# Patient Record
Sex: Male | Born: 1940 | Race: White | Hispanic: No | Marital: Married | Smoking: Former smoker
Health system: Southern US, Community
[De-identification: ages and names within clinical notes are randomized; demographics above are authoritative.]

## PROBLEM LIST (undated history)

## (undated) DIAGNOSIS — Z972 Presence of dental prosthetic device (complete) (partial): Secondary | ICD-10-CM

## (undated) DIAGNOSIS — K08109 Complete loss of teeth, unspecified cause, unspecified class: Secondary | ICD-10-CM

## (undated) DIAGNOSIS — R319 Hematuria, unspecified: Secondary | ICD-10-CM

## (undated) DIAGNOSIS — E119 Type 2 diabetes mellitus without complications: Secondary | ICD-10-CM

## (undated) DIAGNOSIS — E785 Hyperlipidemia, unspecified: Secondary | ICD-10-CM

## (undated) DIAGNOSIS — K219 Gastro-esophageal reflux disease without esophagitis: Secondary | ICD-10-CM

## (undated) DIAGNOSIS — Z87442 Personal history of urinary calculi: Secondary | ICD-10-CM

## (undated) DIAGNOSIS — Z951 Presence of aortocoronary bypass graft: Secondary | ICD-10-CM

## (undated) DIAGNOSIS — N401 Enlarged prostate with lower urinary tract symptoms: Secondary | ICD-10-CM

## (undated) DIAGNOSIS — Z8679 Personal history of other diseases of the circulatory system: Secondary | ICD-10-CM

## (undated) DIAGNOSIS — N200 Calculus of kidney: Secondary | ICD-10-CM

## (undated) DIAGNOSIS — I2510 Atherosclerotic heart disease of native coronary artery without angina pectoris: Secondary | ICD-10-CM

## (undated) DIAGNOSIS — N210 Calculus in bladder: Secondary | ICD-10-CM

## (undated) DIAGNOSIS — I10 Essential (primary) hypertension: Secondary | ICD-10-CM

## (undated) HISTORY — PX: ROTATOR CUFF REPAIR: SHX139

## (undated) HISTORY — PX: CATARACT EXTRACTION W/ INTRAOCULAR LENS  IMPLANT, BILATERAL: SHX1307

---

## 1994-05-18 HISTORY — PX: ROTATOR CUFF REPAIR: SHX139

## 2005-04-05 ENCOUNTER — Ambulatory Visit (HOSPITAL_COMMUNITY): Admission: RE | Admit: 2005-04-05 | Discharge: 2005-04-05 | Payer: Self-pay | Admitting: Gastroenterology

## 2005-04-05 ENCOUNTER — Encounter (INDEPENDENT_AMBULATORY_CARE_PROVIDER_SITE_OTHER): Payer: Self-pay | Admitting: *Deleted

## 2010-04-08 ENCOUNTER — Ambulatory Visit (HOSPITAL_COMMUNITY): Admission: RE | Admit: 2010-04-08 | Discharge: 2010-04-08 | Payer: Self-pay | Admitting: Gastroenterology

## 2010-12-03 NOTE — Op Note (Signed)
NAME:  [name], HOLST NO.:  [id number]   MEDICAL RECORD NO.:  [id number]          PATIENT TYPE:  AMB   LOCATION:  ENDO                         FACILITY:  [facility]   PHYSICIAN:  Danise Edge, M.D.   DATE OF BIRTH:  10/03/1940   DATE OF PROCEDURE:  04/05/2005  DATE OF DISCHARGE:                                 OPERATIVE REPORT   PROCEDURE:  Screening colonoscopy.   INDICATIONS:  Mr. Alberta Cairns is a 70 year old male born November 02, 1940. Mr. Yoshino is scheduled to undergo his first screening colonoscopy  with polypectomy to prevent colon cancer. I discussed with him the  complications associated with colonoscopy and polypectomy including a 15 per  1000 risk of bleeding and 1 per 1000 risk of colon perforation requiring  surgical repair. Mr. Trinka has signed the operative permit.   ENDOSCOPIST:  Danise Edge, M.D.   PREMEDICATION:  Versed to 7.5 milligrams, Demerol 50 milligrams.   DESCRIPTION OF PROCEDURE:  After obtaining informed consent, Mr. Hauk  was placed in the left lateral decubitus position. I administered  intravenous Demerol and intravenous Versed to achieve conscious sedation for  the procedure. The patient's blood pressure, oxygen saturation and cardiac  rhythm were monitored throughout the procedure and documented in the medical  record.   Anal inspection was normal. Digital rectal exam reveals a nonnodular  prostate. The Olympus adjustable pediatric colonoscope was introduced into  the rectum and advanced to the cecum. A normal-appearing ileocecal valve and  appendiceal orifice were identified. Colonic preparation exam today was  satisfactory.   RECTUM:  Normal.  SIGMOID COLON AND DESCENDING COLON:  Normal.  SPLENIC FLEXURE:  Normal.  TRANSVERSE COLON:  Normal.  HEPATIC FLEXURE:  Normal.  ASCENDING COLON:  Normal.  CECUM AND ILEOCECAL VALVE:  From the proximal cecum in the area of the  appendiceal orifice, a 1-mm  sessile polyp was removed with the cold biopsy  forceps.   ASSESSMENT:  A diminutive polyp was removed from the cecum; otherwise normal  screening proctocolonoscopy to the cecum.           ______________________________  Danise Edge, M.D.     MJ/MEDQ  D:  04/05/2005  T:  04/05/2005  Job:  [id number]   cc:   Erskine Speed, M.D.  Fax: [phone]

## 2013-10-22 ENCOUNTER — Other Ambulatory Visit: Payer: Self-pay | Admitting: Internal Medicine

## 2013-10-22 ENCOUNTER — Ambulatory Visit
Admission: RE | Admit: 2013-10-22 | Discharge: 2013-10-22 | Disposition: A | Discharge status: Home / Self Care | Payer: Medicare Other | Source: Ambulatory Visit | Attending: Internal Medicine | Admitting: Internal Medicine

## 2013-10-22 DIAGNOSIS — M25532 Pain in left wrist: Secondary | ICD-10-CM

## 2013-10-22 DIAGNOSIS — R609 Edema, unspecified: Secondary | ICD-10-CM

## 2014-07-28 DIAGNOSIS — M25561 Pain in right knee: Secondary | ICD-10-CM | POA: Diagnosis not present

## 2014-07-28 DIAGNOSIS — I10 Essential (primary) hypertension: Secondary | ICD-10-CM | POA: Diagnosis not present

## 2014-07-28 DIAGNOSIS — E1165 Type 2 diabetes mellitus with hyperglycemia: Secondary | ICD-10-CM | POA: Diagnosis not present

## 2014-09-02 DIAGNOSIS — I10 Essential (primary) hypertension: Secondary | ICD-10-CM | POA: Diagnosis not present

## 2014-09-02 DIAGNOSIS — E1165 Type 2 diabetes mellitus with hyperglycemia: Secondary | ICD-10-CM | POA: Diagnosis not present

## 2014-09-02 DIAGNOSIS — M25561 Pain in right knee: Secondary | ICD-10-CM | POA: Diagnosis not present

## 2014-10-01 DIAGNOSIS — H35369 Drusen (degenerative) of macula, unspecified eye: Secondary | ICD-10-CM | POA: Diagnosis not present

## 2014-11-30 ENCOUNTER — Emergency Department (HOSPITAL_COMMUNITY): Payer: Medicare Other

## 2014-11-30 ENCOUNTER — Encounter (HOSPITAL_COMMUNITY): Payer: Self-pay | Admitting: *Deleted

## 2014-11-30 ENCOUNTER — Inpatient Hospital Stay (HOSPITAL_COMMUNITY): Payer: Medicare Other

## 2014-11-30 ENCOUNTER — Inpatient Hospital Stay (HOSPITAL_COMMUNITY)
Admission: EM | Admit: 2014-11-30 | Discharge: 2014-12-14 | DRG: 234 | Disposition: A | Discharge status: Home / Self Care | Payer: Medicare Other | Attending: Thoracic Surgery (Cardiothoracic Vascular Surgery) | Admitting: Thoracic Surgery (Cardiothoracic Vascular Surgery)

## 2014-11-30 DIAGNOSIS — T8383XA Hemorrhage of genitourinary prosthetic devices, implants and grafts, initial encounter: Secondary | ICD-10-CM | POA: Diagnosis not present

## 2014-11-30 DIAGNOSIS — E785 Hyperlipidemia, unspecified: Secondary | ICD-10-CM | POA: Diagnosis not present

## 2014-11-30 DIAGNOSIS — J984 Other disorders of lung: Secondary | ICD-10-CM | POA: Diagnosis not present

## 2014-11-30 DIAGNOSIS — D62 Acute posthemorrhagic anemia: Secondary | ICD-10-CM | POA: Diagnosis not present

## 2014-11-30 DIAGNOSIS — I214 Non-ST elevation (NSTEMI) myocardial infarction: Secondary | ICD-10-CM | POA: Diagnosis present

## 2014-11-30 DIAGNOSIS — I481 Persistent atrial fibrillation: Secondary | ICD-10-CM | POA: Diagnosis not present

## 2014-11-30 DIAGNOSIS — I25110 Atherosclerotic heart disease of native coronary artery with unstable angina pectoris: Secondary | ICD-10-CM | POA: Diagnosis not present

## 2014-11-30 DIAGNOSIS — E669 Obesity, unspecified: Secondary | ICD-10-CM | POA: Diagnosis present

## 2014-11-30 DIAGNOSIS — Z87891 Personal history of nicotine dependence: Secondary | ICD-10-CM | POA: Diagnosis not present

## 2014-11-30 DIAGNOSIS — R001 Bradycardia, unspecified: Secondary | ICD-10-CM | POA: Diagnosis present

## 2014-11-30 DIAGNOSIS — I2510 Atherosclerotic heart disease of native coronary artery without angina pectoris: Secondary | ICD-10-CM | POA: Diagnosis not present

## 2014-11-30 DIAGNOSIS — Z951 Presence of aortocoronary bypass graft: Secondary | ICD-10-CM

## 2014-11-30 DIAGNOSIS — Z8249 Family history of ischemic heart disease and other diseases of the circulatory system: Secondary | ICD-10-CM

## 2014-11-30 DIAGNOSIS — I517 Cardiomegaly: Secondary | ICD-10-CM | POA: Diagnosis not present

## 2014-11-30 DIAGNOSIS — E119 Type 2 diabetes mellitus without complications: Secondary | ICD-10-CM | POA: Diagnosis not present

## 2014-11-30 DIAGNOSIS — I471 Supraventricular tachycardia: Secondary | ICD-10-CM | POA: Diagnosis not present

## 2014-11-30 DIAGNOSIS — I2119 ST elevation (STEMI) myocardial infarction involving other coronary artery of inferior wall: Secondary | ICD-10-CM | POA: Diagnosis not present

## 2014-11-30 DIAGNOSIS — J9811 Atelectasis: Secondary | ICD-10-CM

## 2014-11-30 DIAGNOSIS — I252 Old myocardial infarction: Secondary | ICD-10-CM

## 2014-11-30 DIAGNOSIS — I4891 Unspecified atrial fibrillation: Secondary | ICD-10-CM | POA: Diagnosis not present

## 2014-11-30 DIAGNOSIS — I10 Essential (primary) hypertension: Secondary | ICD-10-CM | POA: Diagnosis present

## 2014-11-30 DIAGNOSIS — R079 Chest pain, unspecified: Secondary | ICD-10-CM

## 2014-11-30 DIAGNOSIS — Z6833 Body mass index (BMI) 33.0-33.9, adult: Secondary | ICD-10-CM

## 2014-11-30 DIAGNOSIS — Y846 Urinary catheterization as the cause of abnormal reaction of the patient, or of later complication, without mention of misadventure at the time of the procedure: Secondary | ICD-10-CM | POA: Diagnosis not present

## 2014-11-30 DIAGNOSIS — N200 Calculus of kidney: Secondary | ICD-10-CM | POA: Insufficient documentation

## 2014-11-30 HISTORY — DX: Type 2 diabetes mellitus without complications: E11.9

## 2014-11-30 HISTORY — DX: Essential (primary) hypertension: I10

## 2014-11-30 HISTORY — DX: Old myocardial infarction: I25.2

## 2014-11-30 HISTORY — DX: Hyperlipidemia, unspecified: E78.5

## 2014-11-30 HISTORY — DX: Calculus of kidney: N20.0

## 2014-11-30 HISTORY — DX: Presence of aortocoronary bypass graft: Z95.1

## 2014-11-30 LAB — CBC WITH DIFFERENTIAL/PLATELET
BASOS ABS: 0 10*3/uL (ref 0.0–0.1)
Basophils Absolute: 0 10*3/uL (ref 0.0–0.1)
Basophils Relative: 0 % (ref 0–1)
Basophils Relative: 0 % (ref 0–1)
EOS ABS: 0.2 10*3/uL (ref 0.0–0.7)
EOS ABS: 0.1 10*3/uL (ref 0.0–0.7)
EOS PCT: 2 % (ref 0–5)
Eosinophils Relative: 2 % (ref 0–5)
HEMATOCRIT: 39.8 % (ref 39.0–52.0)
HEMATOCRIT: 38.5 % — AB (ref 39.0–52.0)
HEMOGLOBIN: 14.1 g/dL (ref 13.0–17.0)
Hemoglobin: 13.1 g/dL (ref 13.0–17.0)
LYMPHS PCT: 46 % (ref 12–46)
Lymphocytes Relative: 41 % (ref 12–46)
Lymphs Abs: 3.4 10*3/uL (ref 0.7–4.0)
Lymphs Abs: 2.8 10*3/uL (ref 0.7–4.0)
MCH: 31.5 pg (ref 26.0–34.0)
MCH: 30.2 pg (ref 26.0–34.0)
MCHC: 35.4 g/dL (ref 30.0–36.0)
MCHC: 34 g/dL (ref 30.0–36.0)
MCV: 88.8 fL (ref 78.0–100.0)
MCV: 88.7 fL (ref 78.0–100.0)
MONO ABS: 0.6 10*3/uL (ref 0.1–1.0)
MONOS PCT: 8 % (ref 3–12)
MONOS PCT: 7 % (ref 3–12)
Monocytes Absolute: 0.5 10*3/uL (ref 0.1–1.0)
NEUTROS ABS: 3.4 10*3/uL (ref 1.7–7.7)
NEUTROS PCT: 44 % (ref 43–77)
Neutro Abs: 3.3 10*3/uL (ref 1.7–7.7)
Neutrophils Relative %: 50 % (ref 43–77)
PLATELETS: 150 10*3/uL (ref 150–400)
Platelets: 149 10*3/uL — ABNORMAL LOW (ref 150–400)
RBC: 4.48 MIL/uL (ref 4.22–5.81)
RBC: 4.34 MIL/uL (ref 4.22–5.81)
RDW: 12.8 % (ref 11.5–15.5)
RDW: 12.9 % (ref 11.5–15.5)
WBC: 7.5 10*3/uL (ref 4.0–10.5)
WBC: 6.9 10*3/uL (ref 4.0–10.5)

## 2014-11-30 LAB — COMPREHENSIVE METABOLIC PANEL
ALBUMIN: 3.4 g/dL — AB (ref 3.5–5.0)
ALT: 31 U/L (ref 17–63)
ANION GAP: 8 (ref 5–15)
AST: 30 U/L (ref 15–41)
Alkaline Phosphatase: 67 U/L (ref 38–126)
BILIRUBIN TOTAL: 0.9 mg/dL (ref 0.3–1.2)
BUN: 18 mg/dL (ref 6–20)
CO2: 26 mmol/L (ref 22–32)
Calcium: 9.3 mg/dL (ref 8.9–10.3)
Chloride: 105 mmol/L (ref 101–111)
Creatinine, Ser: 0.94 mg/dL (ref 0.61–1.24)
GFR calc non Af Amer: >60 mL/min (ref >60)
Glucose, Bld: 182 mg/dL — ABNORMAL HIGH (ref 65–99)
Potassium: 3.9 mmol/L (ref 3.5–5.1)
Sodium: 139 mmol/L (ref 135–145)
Total Protein: 6.4 g/dL — ABNORMAL LOW (ref 6.5–8.1)

## 2014-11-30 LAB — PROTIME-INR
INR: 1.13 (ref 0.00–1.49)
PROTHROMBIN TIME: 14.6 s (ref 11.6–15.2)

## 2014-11-30 LAB — GLUCOSE, CAPILLARY
GLUCOSE-CAPILLARY: 147 mg/dL — AB (ref 65–99)
GLUCOSE-CAPILLARY: 155 mg/dL — AB (ref 65–99)
Glucose-Capillary: 188 mg/dL — ABNORMAL HIGH (ref 65–99)
Glucose-Capillary: 162 mg/dL — ABNORMAL HIGH (ref 65–99)

## 2014-11-30 LAB — LIPID PANEL
CHOL/HDL RATIO: 3.7 ratio
Cholesterol: 156 mg/dL (ref 0–200)
HDL: 42 mg/dL (ref >40)
LDL CALC: 98 mg/dL (ref 0–99)
TRIGLYCERIDES: 78 mg/dL (ref <150)
VLDL: 16 mg/dL (ref 0–40)

## 2014-11-30 LAB — D-DIMER, QUANTITATIVE: D-Dimer, Quant: 0.34 ug/mL-FEU (ref 0.00–0.48)

## 2014-11-30 LAB — TROPONIN I
TROPONIN I: 0.05 ng/mL — AB (ref <0.031)
Troponin I: 0.21 ng/mL — ABNORMAL HIGH (ref <0.031)
Troponin I: 1.2 ng/mL (ref <0.031)
Troponin I: 1.66 ng/mL (ref <0.031)
Troponin I: 1.46 ng/mL (ref <0.031)
Troponin I: 1.35 ng/mL (ref <0.031)
Troponin I: 1.11 ng/mL (ref <0.031)
Troponin I: 0.9 ng/mL (ref <0.031)

## 2014-11-30 LAB — BASIC METABOLIC PANEL
Anion gap: 13 (ref 5–15)
BUN: 22 mg/dL — ABNORMAL HIGH (ref 6–20)
CHLORIDE: 102 mmol/L (ref 101–111)
CO2: 23 mmol/L (ref 22–32)
Calcium: 9.3 mg/dL (ref 8.9–10.3)
Creatinine, Ser: 0.98 mg/dL (ref 0.61–1.24)
Glucose, Bld: 191 mg/dL — ABNORMAL HIGH (ref 65–99)
POTASSIUM: 3.7 mmol/L (ref 3.5–5.1)
Sodium: 138 mmol/L (ref 135–145)

## 2014-11-30 LAB — MAGNESIUM: Magnesium: 1.7 mg/dL (ref 1.7–2.4)

## 2014-11-30 LAB — HEPARIN LEVEL (UNFRACTIONATED)
HEPARIN UNFRACTIONATED: 0.31 [IU]/mL (ref 0.30–0.70)
Heparin Unfractionated: 0.29 IU/mL — ABNORMAL LOW (ref 0.30–0.70)

## 2014-11-30 LAB — APTT: APTT: 90 s — AB (ref 24–37)

## 2014-11-30 LAB — TSH: TSH: 1.816 u[IU]/mL (ref 0.350–4.500)

## 2014-11-30 MED ORDER — PERFLUTREN LIPID MICROSPHERE
1.0000 mL | INTRAVENOUS | Status: AC | PRN
  Administered 2014-11-30: 2 mL via INTRAVENOUS
  Filled 2014-11-30: qty 10

## 2014-11-30 MED ORDER — NITROGLYCERIN 0.4 MG SL SUBL: 0.4000 mg | SUBLINGUAL_TABLET | SUBLINGUAL | Status: DC | PRN

## 2014-11-30 MED ORDER — INSULIN ASPART 100 UNIT/ML ~~LOC~~ SOLN
0.0000 [IU] | Freq: Three times a day (TID) | SUBCUTANEOUS | Status: DC
  Administered 2014-11-30: 3 [IU] via SUBCUTANEOUS
  Administered 2014-12-01: 2 [IU] via SUBCUTANEOUS
  Administered 2014-12-01: 3 [IU] via SUBCUTANEOUS
  Administered 2014-12-01: 2 [IU] via SUBCUTANEOUS
  Administered 2014-12-02: 3 [IU] via SUBCUTANEOUS
  Administered 2014-12-02 – 2014-12-03 (×2): 2 [IU] via SUBCUTANEOUS
  Administered 2014-12-03 (×2): 3 [IU] via SUBCUTANEOUS
  Administered 2014-12-04: 5 [IU] via SUBCUTANEOUS
  Administered 2014-12-04 (×2): 3 [IU] via SUBCUTANEOUS

## 2014-11-30 MED ORDER — ASPIRIN 300 MG RE SUPP: 300.0000 mg | RECTAL | Status: DC

## 2014-11-30 MED ORDER — ATORVASTATIN CALCIUM 40 MG PO TABS
40.0000 mg | ORAL_TABLET | Freq: Every day | ORAL | Status: DC
  Administered 2014-11-30 – 2014-12-08 (×8): 40 mg via ORAL
  Filled 2014-11-30 (×10): qty 1

## 2014-11-30 MED ORDER — HEPARIN BOLUS VIA INFUSION
4000.0000 [IU] | Freq: Once | INTRAVENOUS | Status: AC
  Administered 2014-11-30: 4000 [IU] via INTRAVENOUS
  Filled 2014-11-30: qty 4000

## 2014-11-30 MED ORDER — SODIUM CHLORIDE 0.9 % IJ SOLN
3.0000 mL | Freq: Two times a day (BID) | INTRAMUSCULAR | Status: DC
  Administered 2014-12-01: 3 mL via INTRAVENOUS

## 2014-11-30 MED ORDER — ACETAMINOPHEN 325 MG PO TABS
650.0000 mg | ORAL_TABLET | ORAL | Status: DC | PRN
  Administered 2014-12-01 – 2014-12-04 (×4): 650 mg via ORAL
  Filled 2014-11-30 (×4): qty 2

## 2014-11-30 MED ORDER — HEPARIN (PORCINE) IN NACL 100-0.45 UNIT/ML-% IJ SOLN
1600.0000 [IU]/h | INTRAMUSCULAR | Status: DC
  Administered 2014-11-30 (×2): 1500 [IU]/h via INTRAVENOUS
  Administered 2014-12-01: 1600 [IU]/h via INTRAVENOUS
  Filled 2014-11-30 (×2): qty 250

## 2014-11-30 MED ORDER — LISINOPRIL 5 MG PO TABS
5.0000 mg | ORAL_TABLET | Freq: Every day | ORAL | Status: DC
  Administered 2014-11-30 – 2014-12-04 (×5): 5 mg via ORAL
  Filled 2014-11-30 (×6): qty 1

## 2014-11-30 MED ORDER — SODIUM CHLORIDE 0.9 % IV SOLN: 250.0000 mL | INTRAVENOUS | Status: DC | PRN

## 2014-11-30 MED ORDER — ONDANSETRON HCL 4 MG/2ML IJ SOLN: 4.0000 mg | Freq: Four times a day (QID) | INTRAMUSCULAR | Status: DC | PRN

## 2014-11-30 MED ORDER — HEPARIN (PORCINE) IN NACL 100-0.45 UNIT/ML-% IJ SOLN
1350.0000 [IU]/h | INTRAMUSCULAR | Status: DC
  Administered 2014-11-30: 1350 [IU]/h via INTRAVENOUS
  Filled 2014-11-30: qty 250

## 2014-11-30 MED ORDER — ASPIRIN EC 81 MG PO TBEC
81.0000 mg | DELAYED_RELEASE_TABLET | Freq: Every day | ORAL | Status: DC
  Administered 2014-12-01 – 2014-12-04 (×4): 81 mg via ORAL
  Filled 2014-11-30 (×5): qty 1

## 2014-11-30 MED ORDER — ASPIRIN 81 MG PO CHEW
81.0000 mg | CHEWABLE_TABLET | ORAL | Status: AC
  Administered 2014-12-01: 81 mg via ORAL
  Filled 2014-11-30: qty 1

## 2014-11-30 MED ORDER — SODIUM CHLORIDE 0.9 % IV SOLN
INTRAVENOUS | Status: DC
  Administered 2014-12-01 (×2): via INTRAVENOUS

## 2014-11-30 MED ORDER — NITROGLYCERIN IN D5W 200-5 MCG/ML-% IV SOLN
3.0000 ug/min | INTRAVENOUS | Status: DC
Start: 2014-11-30 — End: 2014-12-05
  Administered 2014-11-30: 20 ug/min via INTRAVENOUS
  Administered 2014-11-30: 25 ug/min via INTRAVENOUS
  Administered 2014-11-30: 3 ug/min via INTRAVENOUS
  Administered 2014-11-30: 15 ug/min via INTRAVENOUS
  Administered 2014-12-01: 25 ug/min via INTRAVENOUS
  Administered 2014-12-04: 20 ug/min via INTRAVENOUS
  Filled 2014-11-30 (×3): qty 250

## 2014-11-30 MED ORDER — SODIUM CHLORIDE 0.9 % IJ SOLN: 3.0000 mL | INTRAMUSCULAR | Status: DC | PRN

## 2014-11-30 MED ORDER — ASPIRIN 81 MG PO CHEW: 324.0000 mg | CHEWABLE_TABLET | ORAL | Status: DC

## 2014-11-30 NOTE — ED Notes (Signed)
Iv placed pt talkinbg to family   No chest pain.    Sinus bradycardia on monitor

## 2014-11-30 NOTE — Progress Notes (Signed)
ANTICOAGULATION CONSULT NOTE - Follow Up Consult  Pharmacy Consult for Heparin  Indication: chest pain/ACS  No Known Allergies  Patient Measurements: Height: 6' (182.9 cm) Weight: 260 lb 12.9 oz (118.3 kg) IBW/kg (Calculated) : 77.6  Vital Signs: Temp: 97.8 F (36.6 C) (05/15 2030) Temp Source: Oral (05/15 2030) BP: 162/85 mmHg (05/15 2159) Pulse Rate: 55 (05/15 2030)  Labs:  Recent Labs  11/30/14 0204  11/30/14 0715 11/30/14 1125  11/30/14 1517 11/30/14 1803 11/30/14 2123  HGB 14.1  --  13.1  --   --   --   --   --   HCT 39.8  --  38.5*  --   --   --   --   --   PLT 149*  --  150  --   --   --   --   --   APTT  --   --  90*  --   --   --   --   --   LABPROT  --   --  14.6  --   --   --   --   --   INR  --   --  1.13  --   --   --   --   --   HEPARINUNFRC  --   --   --  0.29*  --   --   --  0.31  CREATININE 0.98  --  0.94  --   --   --   --   --   TROPONINI 0.05*  < > 1.20* 1.66*  < > 1.35* 1.11* 0.90*  < > = values in this interval not displayed.  Estimated Creatinine Clearance: 90.2 mL/min (by C-G formula based on Cr of 0.94).    Assessment: Therapeutic heparin level x 1, for cath in AM per MD note  Goal of Therapy:  Heparin level 0.3-0.7 units/ml Monitor platelets by anticoagulation protocol: Yes   Plan:  -Continue heparin at 1500 units/hr -HL with AM labs -Daily CBC/HL -Monitor for bleeding  Abran DukeLedford, Karma Hiney 11/30/2014,10:51 PM

## 2014-11-30 NOTE — ED Notes (Signed)
edp at bedside  

## 2014-11-30 NOTE — ED Provider Notes (Signed)
CSN: [id number]     Arrival date & time 11/30/14  0155 History  This chart was scribed for [name] Dazja Houchin, MD by Freida Busmaniana Omoyeni, ED Scribe. This patient was seen in room D34C/D34C and the patient's care was started 2:11 AM.    Chief Complaint  Patient presents with  . Chest Pain   The history is provided by the patient. No language interpreter was used.     HPI Comments:  [name] is a 74 y.o. male with a h/o  HLD, HTN, and DM  who presents to the Emergency Department via ambulance complaining of an episode of moderate left upper CP pain onset ~0030 this am. Pt notes the episode lasted less than 1 hour. He denies pain at this time. Pt also denies h/o heart disease,  h/o cardiac catheterization, and h/o similar pain. He also denies SOB and swelling to his BLE. He took 4 baby ASA PTA. Pain self resolved. Denies any lower extremity swelling or history of PE.     Past Medical History  Diagnosis Date  . Hypertension   . Diabetes mellitus without complication   . Nephrolithiasis   . Hyperlipidemia    Past Surgical History  Procedure Laterality Date  . Rotator cuff repair Left    Family History  Problem Relation Age of Onset  . Heart disease Mother   . Heart attack Mother   . Alzheimer's disease Father   . Heart disease Brother   . Alzheimer's disease Brother    History  Substance Use Topics  . Smoking status: Former Games developermoker  . Smokeless tobacco: Not on file  . Alcohol Use: 1.2 oz/week    1 Glasses of wine, 1 Cans of beer per week    Review of Systems  Constitutional: Negative.  Negative for fever.  Respiratory: Negative.  Negative for chest tightness and shortness of breath.   Cardiovascular: Positive for chest pain.  Gastrointestinal: Negative.  Negative for abdominal pain.  Genitourinary: Negative.  Negative for dysuria.  Musculoskeletal: Negative for back pain.  Skin: Negative for rash.  Neurological: Negative for headaches.  All other systems reviewed  and are negative.     Allergies  Review of patient's allergies indicates no known allergies.  Home Medications   Prior to Admission medications   Not on File   BP 174/85 mmHg  Pulse 49  Temp(Src) 97.6 Clarke (36.4 C) (Oral)  Resp 18  Ht 6' (1.829 m)  Wt 250 lb (113.399 kg)  BMI 33.90 kg/m2  SpO2 95% Physical Exam  Constitutional: He is oriented to person, place, and time. He appears well-developed and well-nourished.  HENT:  Head: Normocephalic and atraumatic.  Eyes: Pupils are equal, round, and reactive to light.  Neck: Neck supple.  Cardiovascular: Normal rate, regular rhythm and normal heart sounds.   No murmur heard. Pulmonary/Chest: Effort normal and breath sounds normal. No respiratory distress. He has no wheezes.  Abdominal: Soft. Bowel sounds are normal. There is no tenderness. There is no rebound.  Musculoskeletal:  Trace bilateral lower extremity edema  Neurological: He is alert and oriented to person, place, and time.  Skin: Skin is warm and dry.  Psychiatric: He has a normal mood and affect.  Nursing note and vitals reviewed.   ED Course  Procedures   DIAGNOSTIC STUDIES:  Oxygen Saturation is 98% on RA, normal by my interpretation.    COORDINATION OF CARE:  2:16 AM Discussed possibility of hospital admission due to patients risk factors with pt and  family at bedside.  Labs Review Labs Reviewed  BASIC METABOLIC PANEL - Abnormal; Notable for the following:    Glucose, Bld 191 (*)    BUN 22 (*)    All other components within normal limits  TROPONIN I - Abnormal; Notable for the following:    Troponin I 0.05 (*)    All other components within normal limits  CBC WITH DIFFERENTIAL/PLATELET - Abnormal; Notable for the following:    Platelets 149 (*)    All other components within normal limits  TROPONIN I - Abnormal; Notable for the following:    Troponin I 0.21 (*)    All other components within normal limits    Imaging Review Dg Chest 2  View  11/30/2014   CLINICAL DATA:  Acute onset of left-sided chest pain and left arm numbness. Initial encounter.  EXAM: CHEST  2 VIEW  COMPARISON:  None.  FINDINGS: The lungs are well-aerated. Vascular congestion is noted, with mild bilateral atelectasis. There is no evidence of pleural effusion or pneumothorax.  The heart is mildly enlarged. No acute osseous abnormalities are seen.  IMPRESSION: Vascular congestion and mild cardiomegaly, with mild bilateral atelectasis.   Electronically Signed   By: [name]  Chang M.D.   On: 11/30/2014 02:35     EKG Interpretation   Date/Time:  Sunday Nov 30 2014 02:05:29 EDT Ventricular Rate:  64 PR Interval:  218 QRS Duration: 121 QT Interval:  489 QTC Calculation: 505 R Axis:   -12 Text Interpretation:  Normal sinus rhythm Borderline prolonged PR interval  Nonspecific intraventricular conduction delay Inferior infarct, old  Confirmed by Jeremy Rayan  MD, [name] ([id number]) on 11/30/2014 2:52:06 AM      MDM   Final diagnoses:  NSTEMI (non-ST elevated myocardial infarction)    Patient presents with chest pain. Currently chest pain-free. Has risk factors for ACS including hypertension, hyperlipidemia, and diabetes. Pain is somewhat concerning for ACS given location and characterization of pain. Did not resolve with Tums. EKG shows no evidence of ST elevation. No prior for comparison.  Basic labwork obtained. Initial troponin mildly elevated. Discussed with cardiology, given that the patient is pain-free will repeat. Second troponin is rising. Patient continues to be chest pain-free. He has already taken full dose aspirin. He has not required any nitroglycerin. Will be admitted by cardiology.  I personally performed the services described in this documentation, which was scribed in my presence. The recorded information has been reviewed and is accurate.    [name] Ariatna Jester, MD 11/30/14 [phone]

## 2014-11-30 NOTE — Progress Notes (Addendum)
Patient Name: [name] Date of Encounter: 11/30/2014  Active Problems:   NSTEMI (non-ST elevated myocardial infarction)   Length of Stay: 0  SUBJECTIVE  I was called to re-evaluate the patient as he has increasing troponin 0.05 --> 0.21 --> 1.2. The patient is laying in bed, currently on heparin drip and NTG drip that helped with his pain. He feels comfortable, he has minimal pain and no SOB.   CURRENT MEDS . [START ON 12/01/2014] aspirin EC  81 mg Oral Daily  . atorvastatin  40 mg Oral q1800   OBJECTIVE  Filed Vitals:   11/30/14 0500 11/30/14 0524 11/30/14 0530 11/30/14 0600  BP: 187/99 152/92 150/79 144/87  Pulse: 51 51 48 49  Temp:    98.7 F (37.1 C)  TempSrc:    Oral  Resp: 20  18 18   Height:    6' (1.829 m)  Weight:    260 lb 12.9 oz (118.3 kg)  SpO2: 96% 95% 95%     Intake/Output Summary (Last 24 hours) at 11/30/14 0933 Last data filed at 11/30/14 [id number]  Gross per 24 hour  Intake  18.36 ml  Output      0 ml  Net  18.36 ml   Filed Weights   11/30/14 0204 11/30/14 0600  Weight: 250 lb (113.399 kg) 260 lb 12.9 oz (118.3 kg)    PHYSICAL EXAM  General: Pleasant, NAD. Neuro: Alert and oriented X 3. Moves all extremities spontaneously. Psych: Normal affect. HEENT:  Normal  Neck: Supple without bruits or JVD. Lungs:  Resp regular and unlabored, CTA. Heart: RRR no s3, s4, or murmurs. Abdomen: Soft, non-tender, non-distended, BS + x 4.  Extremities: No clubbing, cyanosis or edema. DP/PT/Radials 2+ and equal bilaterally.  Accessory Clinical Findings  CBC  Recent Labs  11/30/14 0204 11/30/14 0715  WBC 7.5 6.9  NEUTROABS 3.3 3.4  HGB 14.1 13.1  HCT 39.8 38.5*  MCV 88.8 88.7  PLT 149* 150   Basic Metabolic Panel  Recent Labs  11/30/14 0204 11/30/14 0715  NA 138 139  K 3.7 3.9  CL 102 105  CO2 23 26  GLUCOSE 191* 182*  BUN 22* 18  CREATININE 0.98 0.94  CALCIUM 9.3 9.3  MG  --  1.7   Liver Function Tests  Recent Labs  11/30/14 0715  AST 30  ALT 31  ALKPHOS 67  BILITOT 0.9  PROT 6.4*  ALBUMIN 3.4*   No results for input(s): LIPASE, AMYLASE in the last 72 hours. Cardiac Enzymes  Recent Labs  11/30/14 0204 11/30/14 0305 11/30/14 0715  TROPONINI 0.05* 0.21* 1.20*   BNP Invalid input(s): POCBNP D-Dimer  Recent Labs  11/30/14 0422  DDIMER 0.34   Hemoglobin A1C No results for input(s): HGBA1C in the last 72 hours. Fasting Lipid Panel  Recent Labs  11/30/14 0713  CHOL 156  HDL 42  LDLCALC 98  TRIG 78  CHOLHDL 3.7   Thyroid Function Tests  Recent Labs  11/30/14 0715  TSH 1.816    Radiology/Studies  Dg Chest 2 View  11/30/2014   CLINICAL DATA:  Acute onset of left-sided chest pain and left arm numbness. Initial encounter.  EXAM: CHEST  2 VIEW  COMPARISON:  None.  FINDINGS: The lungs are well-aerated. Vascular congestion is noted, with mild bilateral atelectasis. There is no evidence of pleural effusion or pneumothorax.  The heart is mildly enlarged. No acute osseous abnormalities are seen.  IMPRESSION: Vascular congestion and mild cardiomegaly, with mild bilateral  atelectasis.   Electronically Signed   By: Roanna RaiderJeffery  Chang M.D.   On: 11/30/2014 02:35    TELE: SB  ECG: SB, inferior MI age undetermined      ASSESSMENT AND PLAN  1. NSTEMI - uptrending troponin, I have personally re-evaluated the patient who is comfortable and evaluated his ECG from 2 am that showes no acute changes. We will continue cycling troponins until downtrending, repeat ECG. We will  Schedule the patient for a cardiac cath for tomorrow, unless his symptoms recur.  We will order an echocardiogram.  We will continue ASA, atorvastatin, heparin and NTG drip. Unable to start BB as he is bradycardic at baseline.  2. HTN - we will start lisinopril 6 mg po daily.   3. Lipids - on atorvastatin 40 mg po daily, LDL 98, TG 78, HDL 42  Signed, Lars MassonNELSON, Trejuan Matherne H MD, [facility] 11/30/2014

## 2014-11-30 NOTE — Progress Notes (Signed)
ANTICOAGULATION CONSULT NOTE - Initial Consult  Pharmacy Consult for Heparin Indication: chest pain/ACS  No Known Allergies  Patient Measurements: Height: 6' (182.9 cm) Weight: 250 lb (113.399 kg) IBW/kg (Calculated) : 77.6 Heparin Dosing Weight: 100 kg  Vital Signs: Temp: 97.6 F (36.4 C) (05/15 0209) Temp Source: Oral (05/15 0209) BP: 172/87 mmHg (05/15 0430) Pulse Rate: 48 (05/15 0430)  Labs:  Recent Labs  11/30/14 0204 11/30/14 0305  HGB 14.1  --   HCT 39.8  --   PLT 149*  --   CREATININE 0.98  --   TROPONINI 0.05* 0.21*    Estimated Creatinine Clearance: 84.7 mL/min (by C-G formula based on Cr of 0.98).   Medical History: Past Medical History  Diagnosis Date  . Hypertension   . Diabetes mellitus without complication   . Nephrolithiasis   . Hyperlipidemia    Assessment: 74 y.o. male with chest pain for heparin   Goal of Therapy:  Heparin level 0.3-0.7 units/ml Monitor platelets by anticoagulation protocol: Yes   Plan:  Heparin 4000 units IV bolus, then start heparin 1350 units/hr Check heparin level in 6 hours.  [name], [name] 11/30/2014,4:56 AM

## 2014-11-30 NOTE — Progress Notes (Signed)
Utilization review completed.  

## 2014-11-30 NOTE — ED Notes (Signed)
The pt arrived by [facility] from home.  He has been having lt sided chest  Pain with lt arm numbness since 0030 no previous history.  The pt took 6 baby aspirin after the pain started.   Unsuccessful iv  Start by ems.  cbg   143.  No pain on arrival

## 2014-11-30 NOTE — ED Notes (Signed)
Dr t turner here to see

## 2014-11-30 NOTE — Progress Notes (Signed)
  Echocardiogram 2D Echocardiogram has been performed.  [name] 11/30/2014, 2:05 PM

## 2014-11-30 NOTE — Progress Notes (Signed)
ANTICOAGULATION CONSULT NOTE - Follow Up Consult  Pharmacy Consult for Heparin Indication: chest pain/ACS  No Known Allergies  Patient Measurements: Height: 6' (182.9 cm) Weight: 260 lb 12.9 oz (118.3 kg) IBW/kg (Calculated) : 77.6 Heparin Dosing Weight: ~100kg  Vital Signs: Temp: 98.7 F (37.1 C) (05/15 0600) Temp Source: Oral (05/15 0600) BP: 144/87 mmHg (05/15 0600) Pulse Rate: 49 (05/15 0600)  Labs:  Recent Labs  11/30/14 0204 11/30/14 0305 11/30/14 0715 11/30/14 1125  HGB 14.1  --  13.1  --   HCT 39.8  --  38.5*  --   PLT 149*  --  150  --   APTT  --   --  90*  --   LABPROT  --   --  14.6  --   INR  --   --  1.13  --   HEPARINUNFRC  --   --   --  0.29*  CREATININE 0.98  --  0.94  --   TROPONINI 0.05* 0.21* 1.20*  --     Estimated Creatinine Clearance: 90.2 mL/min (by C-G formula based on Cr of 0.94).   Medications:  Heparin @ 1350 units/hr  Assessment: 74yom was started on IV heparin this morning for NSTEMI. Initial heparin level is slightly below goal at 0.29. CBC is stable. No bleeding reported. Plan is for cath tomorrow.  Goal of Therapy:  Heparin level 0.3-0.7 units/ml Monitor platelets by anticoagulation protocol: Yes   Plan:  1) Increase heparin to 1500 units/hr 2) Check 8 hour heparin level  Fredrik RiggerMarkle, Mahala Rommel Sue 11/30/2014,12:44 PM

## 2014-11-30 NOTE — ED Notes (Signed)
pharnacy to dose heoparin

## 2014-11-30 NOTE — H&P (Signed)
Admit date: 11/30/2014 Referring Physician: Dr. Wilkie AyeHorton Primary Cardiologist: None Chief complaint/reason for admission:Chest pain  HPI: This is a 74yo WM with a history of HTn and DM who presented to the ER with complaints of CP.  He awakened from sleep at 0030am with left upper chest pain.    PMH:    Past Medical History  Diagnosis Date  . Hypertension   . Diabetes mellitus without complication   . Nephrolithiasis   . Hyperlipidemia     PSH:    Past Surgical History  Procedure Laterality Date  . Rotator cuff repair Left     ALLERGIES:   Review of patient's allergies indicates no known allergies.  Prior to Admit Meds:   (Not in a hospital admission) Family HX:    Family History  Problem Relation Age of Onset  . Heart disease Mother   . Heart attack Mother   . Alzheimer's disease Father   . Heart disease Brother   . Alzheimer's disease Brother    Social HX:    History   Social History  . Marital Status: Married    Spouse Name: N/A  . Number of Children: N/A  . Years of Education: N/A   Occupational History  . Not on file.   Social History Main Topics  . Smoking status: Former Games developermoker  . Smokeless tobacco: Not on file  . Alcohol Use: 1.2 oz/week    1 Glasses of wine, 1 Cans of beer per week  . Drug Use: Not on file  . Sexual Activity: Not on file   Other Topics Concern  . Not on file   Social History Narrative  . No narrative on file     ROS:  All 11 ROS were addressed and are negative except what is stated in the HPI  PHYSICAL EXAM Filed Vitals:   11/30/14 0215  BP: 174/85  Pulse: 49  Temp:   Resp: 18   General: Well developed, well nourished, in no acute distress Head: Eyes PERRLA, No xanthomas.   Normal cephalic and atramatic  Lungs:   Clear bilaterally to auscultation and percussion. Heart:   HRRR S1 S2 Pulses are 2+ & equal.            No carotid bruit. No JVD.  No abdominal bruits. No femoral bruits. Abdomen: Bowel sounds are positive,  abdomen soft and non-tender without masses  Extremities:   No clubbing, cyanosis or edema.  DP +1 Neuro: Alert and oriented X 3. Psych:  Good affect, responds appropriately   Labs:   Lab Results  Component Value Date   WBC 7.5 11/30/2014   HGB 14.1 11/30/2014   HCT 39.8 11/30/2014   MCV 88.8 11/30/2014   PLT 149* 11/30/2014     Recent Labs Lab 11/30/14 0204  NA 138  K 3.7  CL 102  CO2 23  BUN 22*  CREATININE 0.98  CALCIUM 9.3  GLUCOSE 191*   Lab Results  Component Value Date   TROPONINI 0.21* 11/30/2014   No results found for: PTT No results found for: INR, PROTIME  No results found for: CHOL No results found for: HDL No results found for: LDLCALC No results found for: TRIG No results found for: CHOLHDL No results found for: LDLDIRECT    Radiology:  No results found.  EKG:  NSR with inferior infarct and prolonged QTc and nonspecific ST abnormality  ASSESSMENT/PLAN: 1.  Chest pain with EKG showing nonspecific ST abnormality and inferior infarct.  No old  EKG to compare to.  Initial trop elevated at 0.05 and second trop increased to 0.21 consistent with NSTEMI.  He currently is pain free.  CRF include advanced age, DM, dyslipidemia, family history of CAD and HTN.  He has remote history of tobacco use.   smoked.  Will admit to tele bed.  Cycle enzymes until troponin peaks.  IV Heparin gtt per pharmacy.  ASA daily.  Continue Atorvastatin 40mg  daily.  No BB secondary to bradycardia.  Start IV NTG gtt for BP control and CP.  Check 2D echo in am.  Will need pharmacy to verify home meds as patient is uncertain of what he is on. 2.  HTN - elevated BP.  Need to verify home meds with pharmacy 3.  DM - cover with SS Insulin.  He will have his wife bring a list of his meds in 4.  Dyslipidemia - he thinks he takes Atorvastatin 40mg  daily.      Quintella ReichertURNER,Shanecia Hoganson R, MD  11/30/2014  4:11 AM

## 2014-11-30 NOTE — Progress Notes (Signed)
CRITICAL VALUE ALERT  Critical value received:  Troponin 1.20  Date of notification:  11/30/2014  Time of notification:  0845  Critical value read back:Yes.    Nurse who received alert:  Rhae LernerFarrah Donata Reddick RN, CC  MD notified (1st page):  Dr. Delton SeeNelson on floor and made aware  Time of first page:    MD notified (2nd page):  Time of second page:  Responding MD:  Dr. Delton SeeNelson on floor and made aware, to see patient  Time MD responded:     Colman Caterarpley, Rigel Filsinger Danielle

## 2014-11-30 NOTE — ED Notes (Signed)
Pt. To x-ray .

## 2014-11-30 NOTE — ED Notes (Signed)
No chest pain.  Waiting for hep to come up from pharmacy

## 2014-11-30 NOTE — ED Notes (Signed)
Pt. Returned from xray 

## 2014-12-01 ENCOUNTER — Inpatient Hospital Stay (HOSPITAL_COMMUNITY): Payer: Medicare Other

## 2014-12-01 ENCOUNTER — Encounter (HOSPITAL_COMMUNITY)
Admission: EM | Disposition: A | Discharge status: Home / Self Care | Payer: Medicare Other | Source: Home / Self Care | Attending: Thoracic Surgery (Cardiothoracic Vascular Surgery)

## 2014-12-01 DIAGNOSIS — I2510 Atherosclerotic heart disease of native coronary artery without angina pectoris: Secondary | ICD-10-CM

## 2014-12-01 HISTORY — PX: CARDIAC CATHETERIZATION: SHX172

## 2014-12-01 LAB — CBC
HCT: 36.4 % — ABNORMAL LOW (ref 39.0–52.0)
Hemoglobin: 12.4 g/dL — ABNORMAL LOW (ref 13.0–17.0)
MCH: 30.4 pg (ref 26.0–34.0)
MCHC: 34.1 g/dL (ref 30.0–36.0)
MCV: 89.2 fL (ref 78.0–100.0)
PLATELETS: 141 10*3/uL — AB (ref 150–400)
RBC: 4.08 MIL/uL — AB (ref 4.22–5.81)
RDW: 13.1 % (ref 11.5–15.5)
WBC: 7.3 10*3/uL (ref 4.0–10.5)

## 2014-12-01 LAB — GLUCOSE, CAPILLARY
GLUCOSE-CAPILLARY: 174 mg/dL — AB (ref 65–99)
Glucose-Capillary: 149 mg/dL — ABNORMAL HIGH (ref 65–99)
Glucose-Capillary: 122 mg/dL — ABNORMAL HIGH (ref 65–99)
Glucose-Capillary: 147 mg/dL — ABNORMAL HIGH (ref 65–99)

## 2014-12-01 LAB — HEMOGLOBIN A1C
HEMOGLOBIN A1C: 7.9 % — AB (ref 4.8–5.6)
Mean Plasma Glucose: 180 mg/dL

## 2014-12-01 LAB — HEPARIN LEVEL (UNFRACTIONATED): HEPARIN UNFRACTIONATED: 0.27 [IU]/mL — AB (ref 0.30–0.70)

## 2014-12-01 LAB — MRSA PCR SCREENING: MRSA BY PCR: NEGATIVE

## 2014-12-01 SURGERY — LEFT HEART CATH AND CORONARY ANGIOGRAPHY
Anesthesia: LOCAL

## 2014-12-01 MED ORDER — SODIUM CHLORIDE 0.9 % IJ SOLN: 3.0000 mL | INTRAMUSCULAR | Status: DC | PRN

## 2014-12-01 MED ORDER — HEPARIN (PORCINE) IN NACL 2-0.9 UNIT/ML-% IJ SOLN
INTRAMUSCULAR | Status: AC
  Filled 2014-12-01: qty 1500

## 2014-12-01 MED ORDER — HEPARIN SODIUM (PORCINE) 1000 UNIT/ML IJ SOLN
INTRAMUSCULAR | Status: DC | PRN
  Administered 2014-12-01: 5000 [IU] via INTRAVENOUS

## 2014-12-01 MED ORDER — VERAPAMIL HCL 2.5 MG/ML IV SOLN
INTRAVENOUS | Status: DC | PRN
  Administered 2014-12-01: 16:00:00 via INTRA_ARTERIAL

## 2014-12-01 MED ORDER — SODIUM CHLORIDE 0.9 % IJ SOLN
3.0000 mL | Freq: Two times a day (BID) | INTRAMUSCULAR | Status: DC
  Administered 2014-12-01 – 2014-12-04 (×3): 3 mL via INTRAVENOUS

## 2014-12-01 MED ORDER — LIDOCAINE HCL (PF) 1 % IJ SOLN
INTRAMUSCULAR | Status: AC
  Filled 2014-12-01: qty 30

## 2014-12-01 MED ORDER — VERAPAMIL HCL 2.5 MG/ML IV SOLN
INTRAVENOUS | Status: AC
  Filled 2014-12-01: qty 2

## 2014-12-01 MED ORDER — ATENOLOL 100 MG PO TABS
100.0000 mg | ORAL_TABLET | Freq: Every day | ORAL | Status: DC
  Administered 2014-12-01: 100 mg via ORAL
  Filled 2014-12-01 (×2): qty 1

## 2014-12-01 MED ORDER — HEPARIN SODIUM (PORCINE) 1000 UNIT/ML IJ SOLN
INTRAMUSCULAR | Status: AC
  Filled 2014-12-01: qty 1

## 2014-12-01 MED ORDER — SODIUM CHLORIDE 0.9 % IV SOLN
250.0000 mL | INTRAVENOUS | Status: DC | PRN
  Administered 2014-12-05: 13:00:00 via INTRAVENOUS

## 2014-12-01 MED ORDER — IOHEXOL 350 MG/ML SOLN
INTRAVENOUS | Status: DC | PRN
  Administered 2014-12-01: 80 mL via INTRA_ARTERIAL

## 2014-12-01 MED ORDER — MIDAZOLAM HCL 2 MG/2ML IJ SOLN
INTRAMUSCULAR | Status: AC
  Filled 2014-12-01: qty 2

## 2014-12-01 MED ORDER — MIDAZOLAM HCL 2 MG/2ML IJ SOLN
INTRAMUSCULAR | Status: DC | PRN
  Administered 2014-12-01: 1 mg via INTRAVENOUS
  Administered 2014-12-01: 2 mg via INTRAVENOUS

## 2014-12-01 MED ORDER — FENTANYL CITRATE (PF) 100 MCG/2ML IJ SOLN
INTRAMUSCULAR | Status: DC | PRN
  Administered 2014-12-01 (×2): 25 ug via INTRAVENOUS

## 2014-12-01 MED ORDER — DOXAZOSIN MESYLATE 4 MG PO TABS
4.0000 mg | ORAL_TABLET | Freq: Every day | ORAL | Status: DC
  Administered 2014-12-01 – 2014-12-04 (×4): 4 mg via ORAL
  Filled 2014-12-01 (×5): qty 1

## 2014-12-01 MED ORDER — SODIUM CHLORIDE 0.9 % IV SOLN: INTRAVENOUS | Status: AC

## 2014-12-01 MED ORDER — FENTANYL CITRATE (PF) 100 MCG/2ML IJ SOLN
INTRAMUSCULAR | Status: AC
  Filled 2014-12-01: qty 2

## 2014-12-01 MED ORDER — CETYLPYRIDINIUM CHLORIDE 0.05 % MT LIQD
7.0000 mL | Freq: Two times a day (BID) | OROMUCOSAL | Status: DC
  Administered 2014-12-01 – 2014-12-04 (×5): 7 mL via OROMUCOSAL

## 2014-12-01 MED ORDER — HEPARIN (PORCINE) IN NACL 100-0.45 UNIT/ML-% IJ SOLN
1900.0000 [IU]/h | INTRAMUSCULAR | Status: DC
  Administered 2014-12-02 – 2014-12-04 (×5): 1900 [IU]/h via INTRAVENOUS
  Filled 2014-12-01 (×7): qty 250

## 2014-12-01 SURGICAL SUPPLY — 18 items
CATH INFINITI 5 FR JL3.5 (CATHETERS) ×2 IMPLANT
CATH INFINITI 5FR AL1 (CATHETERS) ×1 IMPLANT
CATH INFINITI 5FR ANG PIGTAIL (CATHETERS) ×2 IMPLANT
CATH INFINITI 5FR JL4 (CATHETERS) ×1 IMPLANT
CATH INFINITI 5FR MULTPACK ANG (CATHETERS) IMPLANT
CATH INFINITI JR4 5F (CATHETERS) ×2 IMPLANT
CATH LAUNCHER 5F JR4 (CATHETERS) ×1 IMPLANT
DEVICE RAD COMP TR BAND LRG (VASCULAR PRODUCTS) ×2 IMPLANT
GLIDESHEATH SLEND SS 6F .021 (SHEATH) ×2 IMPLANT
KIT HEART LEFT (KITS) ×2 IMPLANT
PACK CARDIAC CATHETERIZATION (CUSTOM PROCEDURE TRAY) ×2 IMPLANT
SHEATH PINNACLE 5F 10CM (SHEATH) IMPLANT
SYR MEDRAD MARK V 150ML (SYRINGE) ×2 IMPLANT
TRANSDUCER W/STOPCOCK (MISCELLANEOUS) ×2 IMPLANT
TUBING CIL FLEX 10 FLL-RA (TUBING) ×2 IMPLANT
WIRE EMERALD 3MM-J .035X150CM (WIRE) IMPLANT
WIRE HITORQ VERSACORE ST 145CM (WIRE) ×1 IMPLANT
WIRE SAFE-T 1.5MM-J .035X260CM (WIRE) ×2 IMPLANT

## 2014-12-01 NOTE — Interval H&P Note (Signed)
History and Physical Interval Note:  12/01/2014 3:39 PM  [name]  has presented today for surgery, with the diagnosis of NSTEMI  The various methods of treatment have been discussed with the patient and family. After consideration of risks, benefits and other options for treatment, the patient has consented to  Procedure(s): Left Heart Cath and Coronary Angiography (N/A) as a surgical intervention .  The patient's history has been reviewed, patient examined, no change in status, stable for surgery.  I have reviewed the patient's chart and labs.  Questions were answered to the patient's satisfaction.    Cath Lab Visit (complete for each Cath Lab visit)  Clinical Evaluation Leading to the Procedure:   ACS: Yes.    Non-ACS:    Anginal Classification: CCS IV  Anti-ischemic medical therapy: Minimal Therapy (1 class of medications)  Non-Invasive Test Results: No non-invasive testing performed  Prior CABG: No previous CABG       Tonny Bollmanooper, Jais Demir

## 2014-12-01 NOTE — Progress Notes (Signed)
 Patient Name: [name] Date of Encounter: 12/01/2014  Active Problems:   NSTEMI (non-ST elevated myocardial infarction)   Length of Stay: 1  SUBJECTIVE  The patient hasn't had anymore episodes of chest pain since the admission, on iv heparin anf NTG drip, that gave him slight headache. NPO awaiting cath.  CURRENT MEDS . aspirin EC  81 mg Oral Daily  . atorvastatin  40 mg Oral q1800  . insulin aspart  0-15 Units Subcutaneous TID WC  . lisinopril  5 mg Oral Daily  . sodium chloride  3 mL Intravenous Q12H   OBJECTIVE  Filed Vitals:   12/01/14 0245 12/01/14 0500 12/01/14 0611 12/01/14 0724  BP: 123/79  139/72 145/70  Pulse:   52   Temp:   97.5 F (36.4 C) 97.4 F (36.3 C)  TempSrc:   Oral Oral  Resp:   18 18  Height:      Weight:  261 lb 11 oz (118.7 kg)    SpO2:   97% 98%    Intake/Output Summary (Last 24 hours) at 12/01/14 1144 Last data filed at 12/01/14 0500  Gross per 24 hour  Intake 826.94 ml  Output      0 ml  Net 826.94 ml   Filed Weights   11/30/14 0204 11/30/14 0600 12/01/14 0500  Weight: 250 lb (113.399 kg) 260 lb 12.9 oz (118.3 kg) 261 lb 11 oz (118.7 kg)   PHYSICAL EXAM  General: Pleasant, NAD. Neuro: Alert and oriented X 3. Moves all extremities spontaneously. Psych: Normal affect. HEENT:  Normal  Neck: Supple without bruits or JVD. Lungs:  Resp regular and unlabored, CTA. Heart: RRR no s3, s4, or murmurs. Abdomen: Soft, non-tender, non-distended, BS + x 4.  Extremities: No clubbing, cyanosis or edema. DP/PT/Radials 2+ and equal bilaterally.  Accessory Clinical Findings  CBC  Recent Labs  11/30/14 0204 11/30/14 0715 12/01/14 0348  WBC 7.5 6.9 7.3  NEUTROABS 3.3 3.4  --   HGB 14.1 13.1 12.4*  HCT 39.8 38.5* 36.4*  MCV 88.8 88.7 89.2  PLT 149* 150 141*   Basic Metabolic Panel  Recent Labs  11/30/14 0204 11/30/14 0715  NA 138 139  K 3.7 3.9  CL 102 105  CO2 23 26  GLUCOSE 191* 182*  BUN 22* 18  CREATININE 0.98  0.94  CALCIUM 9.3 9.3  MG  --  1.7   Liver Function Tests  Recent Labs  11/30/14 0715  AST 30  ALT 31  ALKPHOS 67  BILITOT 0.9  PROT 6.4*  ALBUMIN 3.4*   No results for input(s): LIPASE, AMYLASE in the last 72 hours. Cardiac Enzymes  Recent Labs  11/30/14 1517 11/30/14 1803 11/30/14 2123  TROPONINI 1.35* 1.11* 0.90*   BNP Invalid input(s): POCBNP D-Dimer  Recent Labs  11/30/14 0422  DDIMER 0.34   Fasting Lipid Panel  Recent Labs  11/30/14 0713  CHOL 156  HDL 42  LDLCALC 98  TRIG 78  CHOLHDL 3.7   Thyroid Function Tests  Recent Labs  11/30/14 0715  TSH 1.816   Radiology/Studies  Dg Chest 2 View  11/30/2014   CLINICAL DATA:  Acute onset of left-sided chest pain and left arm numbness. Initial encounter.  EXAM: CHEST  2 VIEW  COMPARISON:  None.  FINDINGS: The lungs are well-aerated. Vascular congestion is noted, with mild bilateral atelectasis. There is no evidence of pleural effusion or pneumothorax.  The heart is mildly enlarged. No acute osseous abnormalities are seen.  IMPRESSION: Vascular   congestion and mild cardiomegaly, with mild bilateral atelectasis.   Electronically Signed   By: Roanna RaiderJeffery  Chang M.D.   On: 11/30/2014 02:35   TELE: SB  ECG: SB, inferior MI age undetermined  Echo:  Left ventricle: The cavity size was normal. Systolic function was normal. The estimated ejection fraction was in the range of 50% to 55%. Probable mild hypokinesis of the mid-apicalinferior myocardium. Doppler parameters are consistent with abnormal left ventricular relaxation (grade 1 diastolic dysfunction). - Mitral valve: Calcified annulus. There was mild regurgitation. - Left atrium: The atrium was mildly dilated. - Right atrium: The atrium was mildly dilated.    ASSESSMENT AND PLAN  1. NSTEMI - 0.05 --> 0.21 --> 1.2 --> 1.35 --> 1.1, awaiting cath today LVEF preserved.  We will continue ASA, atorvastatin, heparin and NTG drip. Unable to start BB  as he is bradycardic at baseline.  2. HTN - started on lisinopril 5 mg po daily  3. Lipids - on atorvastatin 40 mg po daily, LDL 98, TG 78, HDL 42  Signed, Lars MassonNELSON, Sandra Tellefsen H MD, [facility] 12/01/2014

## 2014-12-01 NOTE — H&P (View-Only) (Signed)
Patient Name: [name] Date of Encounter: 12/01/2014  Active Problems:   NSTEMI (non-ST elevated myocardial infarction)   Length of Stay: 1  SUBJECTIVE  The patient hasn't had anymore episodes of chest pain since the admission, on iv heparin anf NTG drip, that gave him slight headache. NPO awaiting cath.  CURRENT MEDS . aspirin EC  81 mg Oral Daily  . atorvastatin  40 mg Oral q1800  . insulin aspart  0-15 Units Subcutaneous TID WC  . lisinopril  5 mg Oral Daily  . sodium chloride  3 mL Intravenous Q12H   OBJECTIVE  Filed Vitals:   12/01/14 0245 12/01/14 0500 12/01/14 0611 12/01/14 0724  BP: 123/79  139/72 145/70  Pulse:   52   Temp:   97.5 F (36.4 C) 97.4 F (36.3 C)  TempSrc:   Oral Oral  Resp:   18 18  Height:      Weight:  261 lb 11 oz (118.7 kg)    SpO2:   97% 98%    Intake/Output Summary (Last 24 hours) at 12/01/14 1144 Last data filed at 12/01/14 0500  Gross per 24 hour  Intake 826.94 ml  Output      0 ml  Net 826.94 ml   Filed Weights   11/30/14 0204 11/30/14 0600 12/01/14 0500  Weight: 250 lb (113.399 kg) 260 lb 12.9 oz (118.3 kg) 261 lb 11 oz (118.7 kg)   PHYSICAL EXAM  General: Pleasant, NAD. Neuro: Alert and oriented X 3. Moves all extremities spontaneously. Psych: Normal affect. HEENT:  Normal  Neck: Supple without bruits or JVD. Lungs:  Resp regular and unlabored, CTA. Heart: RRR no s3, s4, or murmurs. Abdomen: Soft, non-tender, non-distended, BS + x 4.  Extremities: No clubbing, cyanosis or edema. DP/PT/Radials 2+ and equal bilaterally.  Accessory Clinical Findings  CBC  Recent Labs  11/30/14 0204 11/30/14 0715 12/01/14 0348  WBC 7.5 6.9 7.3  NEUTROABS 3.3 3.4  --   HGB 14.1 13.1 12.4*  HCT 39.8 38.5* 36.4*  MCV 88.8 88.7 89.2  PLT 149* 150 141*   Basic Metabolic Panel  Recent Labs  11/30/14 0204 11/30/14 0715  NA 138 139  K 3.7 3.9  CL 102 105  CO2 23 26  GLUCOSE 191* 182*  BUN 22* 18  CREATININE 0.98  0.94  CALCIUM 9.3 9.3  MG  --  1.7   Liver Function Tests  Recent Labs  11/30/14 0715  AST 30  ALT 31  ALKPHOS 67  BILITOT 0.9  PROT 6.4*  ALBUMIN 3.4*   No results for input(s): LIPASE, AMYLASE in the last 72 hours. Cardiac Enzymes  Recent Labs  11/30/14 1517 11/30/14 1803 11/30/14 2123  TROPONINI 1.35* 1.11* 0.90*   BNP Invalid input(s): POCBNP D-Dimer  Recent Labs  11/30/14 0422  DDIMER 0.34   Fasting Lipid Panel  Recent Labs  11/30/14 0713  CHOL 156  HDL 42  LDLCALC 98  TRIG 78  CHOLHDL 3.7   Thyroid Function Tests  Recent Labs  11/30/14 0715  TSH 1.816   Radiology/Studies  Dg Chest 2 View  11/30/2014   CLINICAL DATA:  Acute onset of left-sided chest pain and left arm numbness. Initial encounter.  EXAM: CHEST  2 VIEW  COMPARISON:  None.  FINDINGS: The lungs are well-aerated. Vascular congestion is noted, with mild bilateral atelectasis. There is no evidence of pleural effusion or pneumothorax.  The heart is mildly enlarged. No acute osseous abnormalities are seen.  IMPRESSION: Vascular  congestion and mild cardiomegaly, with mild bilateral atelectasis.   Electronically Signed   By: Roanna RaiderJeffery  Chang M.D.   On: 11/30/2014 02:35   TELE: SB  ECG: SB, inferior MI age undetermined  Echo:  Left ventricle: The cavity size was normal. Systolic function was normal. The estimated ejection fraction was in the range of 50% to 55%. Probable mild hypokinesis of the mid-apicalinferior myocardium. Doppler parameters are consistent with abnormal left ventricular relaxation (grade 1 diastolic dysfunction). - Mitral valve: Calcified annulus. There was mild regurgitation. - Left atrium: The atrium was mildly dilated. - Right atrium: The atrium was mildly dilated.    ASSESSMENT AND PLAN  1. NSTEMI - 0.05 --> 0.21 --> 1.2 --> 1.35 --> 1.1, awaiting cath today LVEF preserved.  We will continue ASA, atorvastatin, heparin and NTG drip. Unable to start BB  as he is bradycardic at baseline.  2. HTN - started on lisinopril 5 mg po daily  3. Lipids - on atorvastatin 40 mg po daily, LDL 98, TG 78, HDL 42  Signed, Lars MassonNELSON, Sadia Belfiore H MD, [facility] 12/01/2014

## 2014-12-01 NOTE — Progress Notes (Addendum)
ANTICOAGULATION CONSULT NOTE - Follow Up Consult  Pharmacy Consult for Heparin Indication: NSTEMI  No Known Allergies  Patient Measurements: Height: 6' (182.9 cm) Weight: 261 lb 11 oz (118.7 kg) IBW/kg (Calculated) : 77.6 Heparin Dosing Weight:  103.5 kg  Vital Signs: Temp: 97.4 F (36.3 C) (05/16 0724) Temp Source: Oral (05/16 0724) BP: 145/70 mmHg (05/16 0724) Pulse Rate: 52 (05/16 0611)  Labs:  Recent Labs  11/30/14 0204  11/30/14 0715 11/30/14 1125  11/30/14 1517 11/30/14 1803 11/30/14 2123 12/01/14 0348  HGB 14.1  --  13.1  --   --   --   --   --  12.4*  HCT 39.8  --  38.5*  --   --   --   --   --  36.4*  PLT 149*  --  150  --   --   --   --   --  141*  APTT  --   --  90*  --   --   --   --   --   --   LABPROT  --   --  14.6  --   --   --   --   --   --   INR  --   --  1.13  --   --   --   --   --   --   HEPARINUNFRC  --   --   --  0.29*  --   --   --  0.31 0.27*  CREATININE 0.98  --  0.94  --   --   --   --   --   --   TROPONINI 0.05*  < > 1.20* 1.66*  < > 1.35* 1.11* 0.90*  --   < > = values in this interval not displayed.  Estimated Creatinine Clearance: 90.3 mL/min (by C-G formula based on Cr of 0.94).   Assessment: Admitted with Chest pain/ACS for heparin. +enzymes  PMH: HTN, HLD, DM  Anticoagulation  Heparin for ACS. Heparin level 0.27. Hgb 12.4 (baseline 14.1). Plts relatively stable 141.  Cardiovascular:  Htn  HLD - admit w/ NSTEMI. 145/70, bradycardia 52.- on aspirin81, lipitor40 (98), lisinopril, no BB w/ bradycardia, IV Ntg. EF 50-55%  Endocrinology  DM - CBGs elevated 182 on SSI  Nephrology: sCr 0.94, lytes ok except Mg low 1.7  Hematology / Oncology: see anticoag section.  PTA Medication Issues: atenolol, doxazosin, metformin, quinapril  Best Practices: IV heparin  Goal of Therapy:  Heparin level 0.3-0.7 units/ml Monitor platelets by anticoagulation protocol: Yes   Plan:  Increase IV heparin slightly to 1600 units/hr Plan cath  today  Crystal S. 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 Heparin to be resumed 2 hours post TR band removal. Per RN, TR band will be done deflating around 1815. CVTS consult pending for CABG.   Plan: 1) ~ 2030, begin heparin at 1600 units/hr 2) Check 8 hour heparin level 3) Daily heparin level and CBC 4) Follow up CVTS consult  Louie CasaJennifer Herberth Deharo, PharmD, BCPS 12/01/2014, 5:10 PM

## 2014-12-02 ENCOUNTER — Encounter (HOSPITAL_COMMUNITY): Payer: Self-pay | Admitting: Cardiovascular Disease

## 2014-12-02 ENCOUNTER — Other Ambulatory Visit: Payer: Self-pay | Admitting: *Deleted

## 2014-12-02 DIAGNOSIS — I25110 Atherosclerotic heart disease of native coronary artery with unstable angina pectoris: Secondary | ICD-10-CM

## 2014-12-02 DIAGNOSIS — I2510 Atherosclerotic heart disease of native coronary artery without angina pectoris: Secondary | ICD-10-CM

## 2014-12-02 LAB — GLUCOSE, CAPILLARY
Glucose-Capillary: 147 mg/dL — ABNORMAL HIGH (ref 65–99)
Glucose-Capillary: 168 mg/dL — ABNORMAL HIGH (ref 65–99)
Glucose-Capillary: 116 mg/dL — ABNORMAL HIGH (ref 65–99)

## 2014-12-02 LAB — HEPARIN LEVEL (UNFRACTIONATED)
HEPARIN UNFRACTIONATED: 0.7 [IU]/mL (ref 0.30–0.70)
Heparin Unfractionated: 0.48 IU/mL (ref 0.30–0.70)

## 2014-12-02 LAB — CBC
HCT: 36.7 % — ABNORMAL LOW (ref 39.0–52.0)
HEMOGLOBIN: 12.5 g/dL — AB (ref 13.0–17.0)
MCH: 30.6 pg (ref 26.0–34.0)
MCHC: 34.1 g/dL (ref 30.0–36.0)
MCV: 89.7 fL (ref 78.0–100.0)
PLATELETS: 155 10*3/uL (ref 150–400)
RBC: 4.09 MIL/uL — ABNORMAL LOW (ref 4.22–5.81)
RDW: 13.2 % (ref 11.5–15.5)
WBC: 9.3 10*3/uL (ref 4.0–10.5)

## 2014-12-02 LAB — URINALYSIS, ROUTINE W REFLEX MICROSCOPIC
BILIRUBIN URINE: NEGATIVE
Glucose, UA: NEGATIVE mg/dL
Hgb urine dipstick: NEGATIVE
Ketones, ur: NEGATIVE mg/dL
Leukocytes, UA: NEGATIVE
Nitrite: NEGATIVE
PH: 5.5 (ref 5.0–8.0)
PROTEIN: NEGATIVE mg/dL
SPECIFIC GRAVITY, URINE: 1.016 (ref 1.005–1.030)
UROBILINOGEN UA: 1 mg/dL (ref 0.0–1.0)

## 2014-12-02 MED FILL — Heparin Sodium (Porcine) 2 Unit/ML in Sodium Chloride 0.9%: INTRAMUSCULAR | Qty: 1500 | Status: AC

## 2014-12-02 MED FILL — Lidocaine HCl Local Preservative Free (PF) Inj 1%: INTRAMUSCULAR | Qty: 30 | Status: AC

## 2014-12-02 NOTE — Progress Notes (Signed)
CARDIAC REHAB PHASE I   PRE:  Rate/Rhythm: 56 SB  BP:  Supine: 162/72  Sitting:   Standing:    SaO2: 99%RA  MODE:  Ambulation: 350 ft   POST:  Rate/Rhythm: 78 SR  BP:  Supine:   Sitting: 188/80,  185/89  Standing:    SaO2: 98%RA 1415-1450 Pt walked 350 ft on RA with steady gait. No CP. Tolerated well. Discussed the importance of mobility and IS after surgery. Gave pt IS and pt can reach 2000 ml.  Gave OHS booklet and careguide. Discussed sternal precautions after surgery. Wife will be available 24/7 first week pt home. Wrote down how to view pre op video.    Jeremy Nuttingharlene Mandeep Kiser, RN BSN  12/02/2014 2:46 PM

## 2014-12-02 NOTE — Consult Note (Signed)
[address]       [place] [id number]             [phone]          CARDIOTHORACIC SURGERY CONSULTATION REPORT  PCP is GREEN, Lorenda IshiharaEDWIN JAY, MD Referring Provider is Tonny BollmanOOPER, MICHAEL, MD Primary Cardiologist is TURNER, Cornelious BryantRACI R, MD   Reason for consultation:  Severe 3-vessel CAD s/p acute non-STEMI  HPI:  Patient is a 74 year old obese white male with no previous history of coronary artery disease but risk factors notable for history of hypertension, type 2 diabetes mellitus, and hyperlipidemia was admitted to the hospital on 11/30/2014 with a prolonged episode of substernal chest pain and has ruled in for an acute non-ST segment elevation myocardial infarction and has now been referred for surgical consultation to discuss treatment options for management of severe three-vessel coronary artery disease. The patient states that he first began to experience intermittent episodes of substernal chest discomfort approximately 6 months ago. For the most part these symptoms have been associated with physical activity and relieved by rest. Symptoms progressed recently, and during the early morning hours of 11/30/2014 the patient was awoken from his sleep with substernal chest pain radiating across the left chest. Symptoms persisted for more than an hour, prompting the patient to call EMS. The patient took multiple aspirin tablets, and by the time he reached the emergency department his chest pain had subsided.  Baseline electrocardiogram revealed sinus rhythm without acute ST segment changes. The patient ruled in for acute myocardial infarction based on serial troponin levels with a peak troponin measured 1.66. Limited  Transthoracic echocardiogram performed at the time of admission revealed mild left ventricular systolic dysfunction with inferior wall hypokinesis. The patient has remained clinically stable with no further episodes of chest discomfort on intravenous heparin and  nitroglycerin. Diagnostic cardiac catheterization was performed by Dr. Excell Seltzerooper and revealed severe three-vessel coronary artery disease with mild left ventricular systolic dysfunction. Cardiothoracic surgical consultation was requested.  The patient is married and [place]. He has been retired for several years but was working a part-time job up until recently. He enjoys bicycling and continues to ride his bicycle at least once or twice every week. 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 He states that in retrospect he has had progressive symptoms of exertional chest discomfort off and on for nearly 6 months. He denies any associated history of exertional shortness of breath, but he states that his exercise tolerance and stamina have definitely gradually climbed over the last several years. He denies any history of resting shortness of breath, PND, orthopnea, palpitations, dizzy spells, or syncope.  Past Medical History  Diagnosis Date  . Hypertension   . Diabetes mellitus without complication   . Nephrolithiasis   . Hyperlipidemia     Past Surgical History  Procedure Laterality Date  . Rotator cuff repair Left   . Cardiac catheterization N/A 12/01/2014    Procedure: Left Heart Cath and Coronary Angiography;  Surgeon: Tonny BollmanMichael Cooper, MD;  [facility];  Service: Cardiovascular;  Laterality: N/A;    Family History  Problem Relation Age of Onset  . Heart disease Mother   . Heart attack Mother   . Alzheimer's disease Father   . Heart disease Brother   . Alzheimer's disease Brother     History   Social History  .  Marital Status: Married    Spouse Name: N/A  . Number of Children: N/A  . Years of Education: N/A   Occupational History  . Not on file.   Social History Main Topics  . Smoking status: Former Games developer  . Smokeless tobacco:  Not on file  . Alcohol Use: 1.2 oz/week    1 Glasses of wine, 1 Cans of beer per week  . Drug Use: Not on file  . Sexual Activity: Not on file   Other Topics Concern  . Not on file   Social History Narrative    Prior to Admission medications   Medication Sig Start Date End Date Taking? Authorizing Provider  aspirin EC 81 MG tablet Take 81 mg by mouth daily.   Yes Historical Provider, MD  atenolol (TENORMIN) 100 MG tablet Take 100 mg by mouth daily. 10/08/14  Yes Historical Provider, MD  doxazosin (CARDURA) 4 MG tablet Take 4 mg by mouth daily. 11/25/14  Yes Historical Provider, MD  metFORMIN (GLUCOPHAGE) 500 MG tablet Take 500 mg by mouth daily. 10/29/14  Yes Historical Provider, MD  naproxen sodium (ANAPROX) 220 MG tablet Take 440 mg by mouth daily.   Yes Historical Provider, MD  Omega-3 Fatty Acids (OMEGA 3 PO) Take 2 tablets by mouth daily.   Yes Historical Provider, MD  PRODIGY TWIST TOP LANCETS 28G MISC  09/18/14   Historical Provider, MD  quinapril (ACCUPRIL) 40 MG tablet Take 40 mg by mouth daily. 10/29/14  Yes Historical Provider, MD  simvastatin (ZOCOR) 40 MG tablet Take 40 mg by mouth daily. 09/25/14  Yes Historical Provider, MD    Current Facility-Administered Medications  Medication Dose Route Frequency Provider Last Rate Last Dose  . 0.9 %  sodium chloride infusion  250 mL Intravenous PRN Tonny Bollman, MD      . acetaminophen (TYLENOL) tablet 650 mg  650 mg Oral Q4H PRN Quintella Reichert, MD   650 mg at 12/01/14 2220  . antiseptic oral rinse (CPC / CETYLPYRIDINIUM CHLORIDE 0.05%) solution 7 mL  7 mL Mouth Rinse BID Quintella Reichert, MD   7 mL at 12/01/14 2200  . aspirin EC tablet 81 mg  81 mg Oral Daily Quintella Reichert, MD   81 mg at 12/02/14 0851  . atenolol (TENORMIN) tablet 100 mg  100 mg Oral Daily Tonny Bollman, MD   100 mg at 12/01/14 1738  . atorvastatin (LIPITOR) tablet 40 mg  40 mg Oral q1800 Quintella Reichert, MD   40 mg at 12/01/14 1739  . doxazosin (CARDURA) tablet 4 mg   4 mg Oral Daily Tonny Bollman, MD   4 mg at 12/02/14 0851  . heparin ADULT infusion 100 units/mL (25000 units/250 mL)  1,900 Units/hr Intravenous Continuous Quintella Reichert, MD 19 mL/hr at 12/02/14 0858 1,900 Units/hr at 12/02/14 0858  . insulin aspart (novoLOG) injection 0-15 Units  0-15 Units Subcutaneous TID WC Jodelle Gross, NP   2 Units at 12/02/14 [phone]  . lisinopril (PRINIVIL,ZESTRIL) tablet 5 mg  5 mg Oral Daily Lars Masson, MD   5 mg at 12/02/14 0849  . nitroGLYCERIN (NITROSTAT) SL tablet 0.4 mg  0.4 mg Sublingual Q5 Min x 3 PRN Quintella Reichert, MD      . nitroGLYCERIN 50 mg in dextrose 5 % 250 mL (0.2 mg/mL) infusion  3-30 mcg/min Intravenous Titrated Quintella Reichert, MD 6 mL/hr at 12/02/14 0800 20 mcg/min at 12/02/14 0800  . ondansetron (ZOFRAN) injection 4 mg  4 mg Intravenous Q6H PRN Quintella Reichert, MD      . sodium chloride 0.9 % injection 3 mL  3 mL Intravenous Q12H Tonny Bollman, MD   3 mL at 12/01/14 2216  . sodium chloride 0.9 % injection 3 mL  3 mL Intravenous PRN Tonny Bollman, MD        No Known Allergies    Review of Systems:   General:  normal appetite, normal energy, no weight gain, no weight loss, no fever  Cardiac:  + chest pain with exertion, + chest pain at rest, no SOB with exertion, no resting SOB, no PND, no orthopnea, no palpitations, no arrhythmia, no atrial fibrillation, no LE edema, no dizzy spells, no syncope  Respiratory:  no shortness of breath, no home oxygen, no productive cough, no dry cough, no bronchitis, no wheezing, no hemoptysis, no asthma, no pain with inspiration or cough, no sleep apnea, no CPAP at night  GI:   no difficulty swallowing, no reflux, no frequent heartburn, no hiatal hernia, no abdominal pain, no constipation, no diarrhea, no hematochezia, no hematemesis, no melena  GU:   no dysuria,  no frequency, no urinary tract infection, no hematuria, no enlarged prostate, + remote h/o kidney stones, no kidney disease  Vascular:  no  pain suggestive of claudication, no pain in feet, no leg cramps, + varicose veins, no DVT, no non-healing foot ulcer  Neuro:   no stroke, no TIA's, no seizures, no headaches, no temporary blindness one eye,  no slurred speech, no peripheral neuropathy, no chronic pain, no instability of gait, no memory/cognitive dysfunction  Musculoskeletal: + arthritis particularly in left knee, no joint swelling, no myalgias, no difficulty walking, normal mobility   Skin:   no rash, no itching, no skin infections, no pressure sores or ulcerations  Psych:   no anxiety, no depression, no nervousness, no unusual recent stress  Eyes:   no blurry vision, no floaters, no recent vision changes, + wears glasses or contacts  ENT:   no hearing loss, edentulous w/ full set dentures, last saw dentist several years ago  Hematologic:  no easy bruising, no abnormal bleeding, no clotting disorder, no frequent epistaxis  Endocrine:  + diabetes, routinely checks CBG's at home, doesn't know Hgb a1c, doesn't take insulin at home     Physical Exam:   BP 153/83 mmHg  Pulse 51  Temp(Src) 97.7 F (36.5 C) (Oral)  Resp 16  Ht 6' (1.829 m)  Wt 123 kg (271 lb 2.7 oz)  BMI 36.77 kg/m2  SpO2 96%  General:  Obese but o/w  well-appearing  HEENT:  Unremarkable   Neck:   no JVD, no bruits, no adenopathy   Chest:   clear to auscultation, symmetrical breath sounds, no wheezes, no rhonchi   CV:   RRR, no  murmur   Abdomen:  soft, non-tender, no masses   Extremities:  warm, well-perfused, pulses palpable, not lower extremity edema, + varicose veins bilateral lower legs  Rectal/GU  Deferred  Neuro:   Grossly non-focal and symmetrical throughout  Skin:   Clean and dry, no rashes, no breakdown  Diagnostic Tests:   Transthoracic Echocardiography  Patient:  [name], [name] MR #:    [id number] Study Date: 11/30/2014 Gender:   M Age:    74 Height:   182.9 cm Weight:   113.4 kg BSA:    2.44 m^2 Pt.  Status: Room:    3W22C  ADMITTING  Armanda Magic, MD ATTENDING  Armanda Magic,  MD ORDERING   Armanda Magic, MD REFERRING  Armanda Magic, MD SONOGRAPHER Cathie Beams PERFORMING  [facility], Inpatient  cc:  ------------------------------------------------------------------- LV EF: 50% -  55%  ------------------------------------------------------------------- Indications:   Chest pain 786.51.  ------------------------------------------------------------------- History:  PMH: Obesity. Risk factors: Hypertension. Diabetes mellitus. Dyslipidemia.  ------------------------------------------------------------------- Study Conclusions  - Left ventricle: The cavity size was normal. Systolic function was normal. The estimated ejection fraction was in the range of 50% to 55%. Probable mild hypokinesis of the mid-apicalinferior myocardium. Doppler parameters are consistent with abnormal left ventricular relaxation (grade 1 diastolic dysfunction). - Mitral valve: Calcified annulus. There was mild regurgitation. - Left atrium: The atrium was mildly dilated. - Right atrium: The atrium was mildly dilated.  Transthoracic echocardiography. M-mode, complete 2D, spectral Doppler, and color Doppler. Birthdate: Patient birthdate: Jan 31, 1941. Age: Patient is 74 yr old. Sex: Gender: male. BMI: 33.9 kg/m^2. Blood pressure:   143/78 Patient status: Inpatient. Study date: Study date: 11/30/2014. Study time: 03:59 PM. Location: Bedside.  -------------------------------------------------------------------  ------------------------------------------------------------------- Left ventricle: The cavity size was normal. Systolic function was normal. The estimated ejection fraction was in the range of 50% to 55%. Regional wall motion abnormalities:  Probable mild hypokinesis of the mid-apicalinferior myocardium. Doppler parameters are consistent with  abnormal left ventricular relaxation (grade 1 diastolic dysfunction).  ------------------------------------------------------------------- Aortic valve:  Structurally normal valve.  Cusp separation was normal. Doppler: Transvalvular velocity was within the normal range. There was no stenosis. There was no regurgitation.  ------------------------------------------------------------------- Aorta: The aorta was normal, not dilated, and non-diseased.  ------------------------------------------------------------------- Mitral valve:  Calcified annulus. Leaflet separation was normal. Doppler: Transvalvular velocity was within the normal range. There was no evidence for stenosis. There was mild regurgitation.  ------------------------------------------------------------------- Left atrium: The atrium was mildly dilated.  ------------------------------------------------------------------- Right ventricle: The cavity size was normal. Wall thickness was normal. Systolic function was normal.  ------------------------------------------------------------------- Pulmonic valve:  The valve appears to be grossly normal. Doppler: There was no significant regurgitation.  ------------------------------------------------------------------- Tricuspid valve: Poorly visualized. Structurally normal valve. Leaflet separation was normal. Doppler: Transvalvular velocity was within the normal range. There was mild regurgitation.  ------------------------------------------------------------------- Right atrium: The atrium was mildly dilated.  ------------------------------------------------------------------- Pericardium: There was no pericardial effusion.  ------------------------------------------------------------------- Post procedure conclusions Ascending Aorta:  - The aorta was normal, not dilated, and  non-diseased.  ------------------------------------------------------------------- Measurements  Left ventricle              Value    Reference LV ID, ED, PLAX chordal    (H)    54  mm   43 - 52 LV ID, ES, PLAX chordal         38  mm   23 - 38 LV fx shortening, PLAX chordal      30  %   >=29 LV PW thickness, ED           15  mm   --------- IVS/LV PW ratio, ED           0.87     <=1.3 LV e&', lateral              4.46 cm/s  --------- LV E/e&', lateral             14.24    --------- LV e&', medial              4.68 cm/s  --------- LV E/e&', medial             13.57    --------- LV e&', average  4.57 cm/s  --------- LV E/e&', average             13.89    ---------  Ventricular septum            Value    Reference IVS thickness, ED            13  mm   ---------  Aorta                  Value    Reference Aortic root ID, ED            34  mm   ---------  Left atrium               Value    Reference LA ID, A-P, ES              51  mm   --------- LA ID/bsa, A-P              2.09 cm/m^2 <=2.2 LA volume, S               97.4 ml   --------- LA volume/bsa, S             40  ml/m^2 --------- LA volume, ES, 1-p A4C          99.2 ml   --------- LA volume/bsa, ES, 1-p A4C        40.7 ml/m^2 --------- LA volume, ES, 1-p A2C          92.9 ml   --------- LA volume/bsa, ES, 1-p A2C        38.1 ml/m^2 ---------  Mitral valve               Value    Reference Mitral E-wave peak velocity       63.5 cm/s  --------- Mitral A-wave peak velocity       80.5 cm/s   --------- Mitral deceleration time    (H)    525  ms   150 - 230 Mitral E/A ratio, peak          0.8     ---------  Right ventricle             Value    Reference RV s&', lateral, S            19.7 cm/s  ---------  Legend: (L) and (H) mark values outside specified reference range.  ------------------------------------------------------------------- Prepared and Electronically Authenticated by  Cassell Clement 2016-05-15T17:05:22    CARDIAC CATHETERIZATION:  FINAL CONCLUSION:  Severe diffuse three-vessel coronary artery disease with severe calcific stenosis of the LAD, severe stenosis of the second OM branch, and total occlusion of a large, dominant RCA with the presence of left-to-right collaterals  Mild segmental LV contraction abnormality with preserved overall LVEF  RECOMMENDATIONS:  Cardiac surgery consultation for consideration of CABG  Will transfer patient to A 2 Heart stepdown bed, resume IV heparin, and continue IV NTG.    Coronary Findings    Dominance: Right   Left Main   . LM lesion, 25% stenosed. calcified . Mild distal left main stenosis with associated calcification     Left Anterior Descending   . Ost LAD lesion, 50% stenosed. calcified .   [name] Prox LAD to Mid LAD lesion, 99% stenosed. calcified, diffuse .   [name] Mid LAD lesion, 80% stenosed. calcified .   [name] First Product manager   . Ost 1st Diag to 1st Diag lesion, 75% stenosed.  calcified .     Left Circumflex   . Prox Cx lesion, 30% stenosed. calcified .   [name]. First Obtuse Marginal Branch   . 1st Mrg lesion, 40% stenosed.   . Second Obtuse Marginal Branch   The vessel is angiographically normal.   . 2nd Mrg lesion, 99% stenosed.     Right Coronary Artery  Dist RCA filled by collaterals from 1st Sept.   . Mid RCA lesion, 100% stenosed. calcified .   [name]. Inferior Septal   Inf Sept filled by collaterals from 2nd Sept.   . First Right  Posterolateral   1st RPLB filled by collaterals from 1st Sept.        Impression:  Patient has severe three-vessel coronary artery disease with mild left ventricular systolic dysfunction. He presents with a six-month history of progressive symptoms of angina pectoris culminating in an acute presentation with non-ST segment elevation myocardial infarction. I have personally reviewed the patient's limited transthoracic echocardiogram and diagnostic cardiac catheterization. The patient has severe multivessel coronary artery disease with coronary anatomy that is completely unfavorable for an attempt at percutaneous coronary intervention. I agree that the only reasonable long-term solution for this patient is surgical revascularization. Risks associated with coronary artery bypass grafting should be relatively low.    Plan:  I have reviewed the indications, risks, and potential benefits of coronary artery bypass grafting with the patient and his family.  Alternative treatment strategies have been discussed, including the relative risks, benefits and long term prognosis associated with medical therapy, percutaneous coronary intervention, and surgical revascularization.  The patient understands and accepts all potential associated risks of surgery including but not limited to risk of death, stroke or other neurologic complication, myocardial infarction, congestive heart failure, respiratory failure, renal failure, bleeding requiring blood transfusion and/or reexploration, aortic dissection or other major vascular complication, arrhythmia, heart block or bradycardia requiring permanent pacemaker, pneumonia, pleural effusion, wound infection, pulmonary embolus or other thromboembolic complication, chronic pain or other delayed complications related to median sternotomy, or the late recurrence of symptomatic ischemic heart disease and/or congestive heart failure.  The importance of long term risk modification  have been emphasized.  All questions answered.  We tentatively plan to proceed with surgery on Friday, 12/05/2014.     I spent in excess of 120 minutes during the conduct of this hospital consultation and >50% of this time involved direct face-to-face encounter for counseling and/or coordination of the patient's care.  Salvatore Decentlarence H. Cornelius Moraswen, MD 12/02/2014  11:05 AM

## 2014-12-02 NOTE — Progress Notes (Signed)
ANTICOAGULATION CONSULT NOTE - Follow Up Consult  Pharmacy Consult for Heparin Indication: NSTEMI  No Known Allergies  Patient Measurements: Height: 6' (182.9 cm) Weight: 271 lb 2.7 oz (123 kg) IBW/kg (Calculated) : 77.6 Heparin Dosing Weight:  103.5 kg  Vital Signs: Temp: 97.7 F (36.5 C) (05/17 0800) Temp Source: Oral (05/17 0800) BP: 153/83 mmHg (05/17 0851) Pulse Rate: 51 (05/17 0609)  Labs:  Recent Labs  11/30/14 0204  11/30/14 0715  11/30/14 1517 11/30/14 1803 11/30/14 2123 12/01/14 0348 12/02/14 0220 12/02/14 1005  HGB 14.1  --  13.1  --   --   --   --  12.4* 12.5*  --   HCT 39.8  --  38.5*  --   --   --   --  36.4* 36.7*  --   PLT 149*  --  150  --   --   --   --  141* 155  --   APTT  --   --  90*  --   --   --   --   --   --   --   LABPROT  --   --  14.6  --   --   --   --   --   --   --   INR  --   --  1.13  --   --   --   --   --   --   --   HEPARINUNFRC  --   --   --   < >  --   --  0.31 0.27* <0.10* 0.48  CREATININE 0.98  --  0.94  --   --   --   --   --   --   --   TROPONINI 0.05*  < > 1.20*  < > 1.35* 1.11* 0.90*  --   --   --   < > = values in this interval not displayed.  Estimated Creatinine Clearance: 92 mL/min (by C-G formula based on Cr of 0.94).   Assessment: CC: [name] is an 74 y.o. male admitted on 11/30/2014 presenting with CP.  EKG showed no evidence of ST elevation but elevated troponins.  Patient underwent cath 5/16  revealing severe stenosis of LAD, OM, & RCA requiring CABG.  Patient to continue heparin gtt.    Hgb 12.5 stable (baseline 14.1). Plts stable 155. D-dimer neg.    Heparin level today is therapeutic at 0.48.  No reports of bleeding at this time.    Goal of Therapy:  Heparin level 0.3-0.7 units/ml Monitor platelets by anticoagulation protocol: Yes   Plan:  Continue heparin at 1900 units/hr Confirmation heparin level in PM Daily heparin level and CBC Monitor for signs and symptoms of bleeding  Red ChristiansSamson Cyprus Kuang,  Pharm. D. Clinical Pharmacy Resident Pager: [phone] Ph: [phone] 12/02/2014 11:27 AM

## 2014-12-02 NOTE — Progress Notes (Signed)
[name] Date of Encounter: 12/02/2014  Active Problems:   NSTEMI (non-ST elevated myocardial infarction)   Length of Stay: 2  SUBJECTIVE  The patient hasn't had anymore episodes of chest pain since the admission, on iv heparin anf NTG drip, that gave him slight headache. He is anxious about his prognosis and timing of his surgery as his step daughter is getting married on 12/13/14.  CURRENT MEDS . antiseptic oral rinse  7 mL Mouth Rinse BID  . aspirin EC  81 mg Oral Daily  . atenolol  100 mg Oral Daily  . atorvastatin  40 mg Oral q1800  . doxazosin  4 mg Oral Daily  . insulin aspart  0-15 Units Subcutaneous TID WC  . lisinopril  5 mg Oral Daily  . sodium chloride  3 mL Intravenous Q12H   OBJECTIVE  Filed Vitals:   12/02/14 0100 12/02/14 0200 12/02/14 0343 12/02/14 0609  BP: 113/68 102/37 136/82 147/76  Pulse: 50 48 49 51  Temp:   98.6 F (37 C) 98.7 F (37.1 C)  TempSrc:   Oral Oral  Resp: 23 16 19 16   Height:      Weight:      SpO2: 95% 97% 97% 96%    Intake/Output Summary (Last 24 hours) at 12/02/14 0848 Last data filed at 12/02/14 0700  Gross per 24 hour  Intake 428.67 ml  Output   1200 ml  Net -771.33 ml   Filed Weights   11/30/14 0600 12/01/14 0500 12/01/14 1703  Weight: 260 lb 12.9 oz (118.3 kg) 261 lb 11 oz (118.7 kg) 271 lb 2.7 oz (123 kg)   PHYSICAL EXAM  General: Pleasant, NAD. Neuro: Alert and oriented X 3. Moves all extremities spontaneously. Psych: Normal affect. HEENT:  Normal  Neck: Supple without bruits or JVD. Lungs:  Resp regular and unlabored, CTA. Heart: RRR no s3, s4, or murmurs. Abdomen: Soft, non-tender, non-distended, BS + x 4.  Extremities: No clubbing, cyanosis or edema. DP/PT/Radials 2+ and equal bilaterally.  Accessory Clinical Findings  CBC  Recent Labs  11/30/14 0204 11/30/14 0715 12/01/14 0348 12/02/14 0220  WBC 7.5 6.9 7.3 9.3  NEUTROABS 3.3 3.4  --   --   HGB 14.1 13.1 12.4* 12.5*    HCT 39.8 38.5* 36.4* 36.7*  MCV 88.8 88.7 89.2 89.7  PLT 149* 150 141* 155   Basic Metabolic Panel  Recent Labs  11/30/14 0204 11/30/14 0715  NA 138 139  K 3.7 3.9  CL 102 105  CO2 23 26  GLUCOSE 191* 182*  BUN 22* 18  CREATININE 0.98 0.94  CALCIUM 9.3 9.3  MG  --  1.7   Liver Function Tests  Recent Labs  11/30/14 0715  AST 30  ALT 31  ALKPHOS 67  BILITOT 0.9  PROT 6.4*  ALBUMIN 3.4*     Recent Labs  11/30/14 1517 11/30/14 1803 11/30/14 2123  TROPONINI 1.35* 1.11* 0.90*   D-Dimer  Recent Labs  11/30/14 0422  DDIMER 0.34   Fasting Lipid Panel  Recent Labs  11/30/14 0713  CHOL 156  HDL 42  LDLCALC 98  TRIG 78  CHOLHDL 3.7   Thyroid Function Tests  Recent Labs  11/30/14 0715  TSH 1.816   Radiology/Studies  Dg Chest 2 View  11/30/2014   CLINICAL DATA:  Acute onset of left-sided chest pain and left arm numbness. Initial encounter.  EXAM: CHEST  2 VIEW  COMPARISON:  None.  FINDINGS: The lungs are  well-aerated. Vascular congestion is noted, with mild bilateral atelectasis. There is no evidence of pleural effusion or pneumothorax.  The heart is mildly enlarged. No acute osseous abnormalities are seen.  IMPRESSION: Vascular congestion and mild cardiomegaly, with mild bilateral atelectasis.   Electronically Signed   By: Roanna RaiderJeffery  Chang M.D.   On: 11/30/2014 02:35   TELE: SB  ECG: SB, inferior MI age undetermined  Echo:  Left ventricle: The cavity size was normal. Systolic function was normal. The estimated ejection fraction was in the range of 50% to 55%. Probable mild hypokinesis of the mid-apicalinferior myocardium. Doppler parameters are consistent with abnormal left ventricular relaxation (grade 1 diastolic dysfunction). - Mitral valve: Calcified annulus. There was mild regurgitation. - Left atrium: The atrium was mildly dilated. - Right atrium: The atrium was mildly dilated.  Cardiac cath: FINAL CONCLUSION:  Severe diffuse  three-vessel coronary artery disease with severe calcific stenosis of the LAD, severe stenosis of the second OM branch, and total occlusion of a large, dominant RCA with the presence of left-to-right collaterals  Mild segmental LV contraction abnormality with preserved overall LVEF  RECOMMENDATIONS:  Cardiac surgery consultation for consideration of CABG  Will transfer patient to A 2 Heart stepdown bed, resume IV heparin, and continue IV NTG.   ASSESSMENT AND PLAN  1. NSTEMI - 0.05 --> 0.21 --> 1.2 --> 1.35 --> 1.1, cath showed severe 3-VD, LVEF preserved.  Awaiting CT surgery consult.  We will continue ASA, atorvastatin, heparin and NTG drip. Chest pain free.  2. HTN - started on lisinopril 5 mg po daily, BP controlled  3. Lipids - on atorvastatin 40 mg po daily, LDL 98, TG 78, HDL 42  Signed, Lars MassonNELSON, Jameyah Fennewald H MD, [facility] 12/02/2014

## 2014-12-02 NOTE — Progress Notes (Signed)
ANTICOAGULATION CONSULT NOTE - Follow Up Consult  Pharmacy Consult for Heparin Indication: NSTEMI  No Known Allergies  Patient Measurements: Height: 6' (182.9 cm) Weight: 271 lb 2.7 oz (123 kg) IBW/kg (Calculated) : 77.6 Heparin Dosing Weight:  103.5 kg  Vital Signs: Temp: 98.2 F (36.8 C) (05/17 1600) Temp Source: Oral (05/17 1600) BP: 163/98 mmHg (05/17 1600) Pulse Rate: 59 (05/17 1600)  Labs:  Recent Labs  11/30/14 0204  11/30/14 0715  11/30/14 1517 11/30/14 1803 11/30/14 2123 12/01/14 0348 12/02/14 0220 12/02/14 1005 12/02/14 1705  HGB 14.1  --  13.1  --   --   --   --  12.4* 12.5*  --   --   HCT 39.8  --  38.5*  --   --   --   --  36.4* 36.7*  --   --   PLT 149*  --  150  --   --   --   --  141* 155  --   --   APTT  --   --  90*  --   --   --   --   --   --   --   --   LABPROT  --   --  14.6  --   --   --   --   --   --   --   --   INR  --   --  1.13  --   --   --   --   --   --   --   --   HEPARINUNFRC  --   --   --   < >  --   --  0.31 0.27* <0.10* 0.48 0.70  CREATININE 0.98  --  0.94  --   --   --   --   --   --   --   --   TROPONINI 0.05*  < > 1.20*  < > 1.35* 1.11* 0.90*  --   --   --   --   < > = values in this interval not displayed.  Estimated Creatinine Clearance: 92 mL/min (by C-G formula based on Cr of 0.94).   Assessment: 74 y.o. male admitted on 11/30/2014 presenting with CP.  Patient underwent cath 5/16  revealing severe stenosis of LAD, OM, & RCA requiring CABG.  Patient to continue heparin gtt until surgery Friday 12/05/14.    Hgb 12.5 stable (baseline 14.1). Plts stable 155.    Confirmatory heparin level today is therapeutic at 0.7.  No reports of bleeding at this time.    Goal of Therapy:  Heparin level 0.3-0.7 units/ml Monitor platelets by anticoagulation protocol: Yes   Plan:  Continue heparin at 1900 units/hr Daily heparin level and CBC Monitor for signs and symptoms of bleeding Herby AbrahamMichelle T. Jassmine Vandruff, Pharm.D. [phone] 12/02/2014 5:41  PM

## 2014-12-02 NOTE — Progress Notes (Signed)
ANTICOAGULATION CONSULT NOTE  Pharmacy Consult for Heparin Indication: NSTEMI  No Known Allergies  Patient Measurements: Height: 6' (182.9 cm) Weight: 271 lb 2.7 oz (123 kg) IBW/kg (Calculated) : 77.6 Heparin Dosing Weight:  103.5 kg  Vital Signs: Temp: 98.4 F (36.9 C) (05/17 0000) Temp Source: Oral (05/17 0000) BP: 102/37 mmHg (05/17 0200) Pulse Rate: 48 (05/17 0200)  Labs:  Recent Labs  11/30/14 0204  11/30/14 0715  11/30/14 1517 11/30/14 1803 11/30/14 2123 12/01/14 0348 12/02/14 0220  HGB 14.1  --  13.1  --   --   --   --  12.4* 12.5*  HCT 39.8  --  38.5*  --   --   --   --  36.4* 36.7*  PLT 149*  --  150  --   --   --   --  141* 155  APTT  --   --  90*  --   --   --   --   --   --   LABPROT  --   --  14.6  --   --   --   --   --   --   INR  --   --  1.13  --   --   --   --   --   --   HEPARINUNFRC  --   --   --   < >  --   --  0.31 0.27* <0.10*  CREATININE 0.98  --  0.94  --   --   --   --   --   --   TROPONINI 0.05*  < > 1.20*  < > 1.35* 1.11* 0.90*  --   --   < > = values in this interval not displayed.  Estimated Creatinine Clearance: 92 mL/min (by C-G formula based on Cr of 0.94).   Assessment: 74 y.o. male with NSTEMI s/p cath, awiting CVTS consult, for heparin  Goal of Therapy:  Heparin level 0.3-0.7 units/ml Monitor platelets by anticoagulation protocol: Yes   Plan:  Increase Heparin 1900 units/hr Check heparin level in 6 hours.   Geannie RisenGreg Ashton Sabine, PharmD, BCPS

## 2014-12-03 ENCOUNTER — Inpatient Hospital Stay (HOSPITAL_COMMUNITY): Payer: Medicare Other

## 2014-12-03 DIAGNOSIS — I471 Supraventricular tachycardia: Secondary | ICD-10-CM

## 2014-12-03 LAB — COMPREHENSIVE METABOLIC PANEL
ALT: 24 U/L (ref 17–63)
AST: 23 U/L (ref 15–41)
Albumin: 3.5 g/dL (ref 3.5–5.0)
Alkaline Phosphatase: 72 U/L (ref 38–126)
Anion gap: 10 (ref 5–15)
BUN: 10 mg/dL (ref 6–20)
CO2: 23 mmol/L (ref 22–32)
CREATININE: 0.88 mg/dL (ref 0.61–1.24)
Calcium: 9.1 mg/dL (ref 8.9–10.3)
Chloride: 104 mmol/L (ref 101–111)
GFR calc non Af Amer: >60 mL/min (ref >60)
Glucose, Bld: 149 mg/dL — ABNORMAL HIGH (ref 65–99)
Potassium: 3.3 mmol/L — ABNORMAL LOW (ref 3.5–5.1)
Sodium: 137 mmol/L (ref 135–145)
TOTAL PROTEIN: 6.5 g/dL (ref 6.5–8.1)
Total Bilirubin: 1.6 mg/dL — ABNORMAL HIGH (ref 0.3–1.2)

## 2014-12-03 LAB — GLUCOSE, CAPILLARY
GLUCOSE-CAPILLARY: 160 mg/dL — AB (ref 65–99)
Glucose-Capillary: 169 mg/dL — ABNORMAL HIGH (ref 65–99)
Glucose-Capillary: 158 mg/dL — ABNORMAL HIGH (ref 65–99)
Glucose-Capillary: 121 mg/dL — ABNORMAL HIGH (ref 65–99)
Glucose-Capillary: 163 mg/dL — ABNORMAL HIGH (ref 65–99)

## 2014-12-03 LAB — CBC
HEMATOCRIT: 36.4 % — AB (ref 39.0–52.0)
Hemoglobin: 12.7 g/dL — ABNORMAL LOW (ref 13.0–17.0)
MCH: 31.1 pg (ref 26.0–34.0)
MCHC: 34.9 g/dL (ref 30.0–36.0)
MCV: 89.2 fL (ref 78.0–100.0)
Platelets: 143 10*3/uL — ABNORMAL LOW (ref 150–400)
RBC: 4.08 MIL/uL — AB (ref 4.22–5.81)
RDW: 13 % (ref 11.5–15.5)
WBC: 8 10*3/uL (ref 4.0–10.5)

## 2014-12-03 LAB — HEPARIN LEVEL (UNFRACTIONATED): HEPARIN UNFRACTIONATED: 0.56 [IU]/mL (ref 0.30–0.70)

## 2014-12-03 MED ORDER — CARVEDILOL 3.125 MG PO TABS
3.1250 mg | ORAL_TABLET | Freq: Two times a day (BID) | ORAL | Status: DC
  Administered 2014-12-03 – 2014-12-04 (×3): 3.125 mg via ORAL
  Filled 2014-12-03 (×7): qty 1

## 2014-12-03 MED ORDER — POTASSIUM CHLORIDE CRYS ER 20 MEQ PO TBCR
40.0000 meq | EXTENDED_RELEASE_TABLET | Freq: Once | ORAL | Status: AC
  Administered 2014-12-03: 40 meq via ORAL
  Filled 2014-12-03: qty 2

## 2014-12-03 NOTE — Progress Notes (Signed)
Pre-op Cardiac Surgery  Carotid Findings:  Bilateral:  1-39% ICA stenosis.  Vertebral artery flow is antegrade.      Upper Extremity Right Left  Brachial Pressures 128 121  Radial Waveforms Tri Tri  Ulnar Waveforms Tri Tri  Palmar Arch (Allen's Test) Decreases >50% with radial compression, normal with ulnar compression Normal   Findings:  Palpable pedal pulses    Jeremy DemarkJill Eunice, RDMS, RVT 12/03/2014

## 2014-12-03 NOTE — Progress Notes (Signed)
ANTICOAGULATION CONSULT NOTE - Follow Up Consult  Pharmacy Consult for Heparin Indication: NSTEMI  No Known Allergies  Patient Measurements: Height: 6' (182.9 cm) Weight: 271 lb 2.7 oz (123 kg) IBW/kg (Calculated) : 77.6 Heparin Dosing Weight:  103.5 kg  Vital Signs: Temp: 97.9 F (36.6 C) (05/18 1124) Temp Source: Oral (05/18 1124) BP: 128/77 mmHg (05/18 1124) Pulse Rate: 49 (05/18 1124)  Labs:  Recent Labs  11/30/14 1517 11/30/14 1803  11/30/14 2123  12/01/14 0348 12/02/14 0220 12/02/14 1005 12/02/14 1705 12/03/14 0227  HGB  --   --   --   --   < > 12.4* 12.5*  --   --  12.7*  HCT  --   --   --   --   --  36.4* 36.7*  --   --  36.4*  PLT  --   --   --   --   --  141* 155  --   --  143*  HEPARINUNFRC  --   --   < > 0.31  --  0.27* <0.10* 0.48 0.70 0.56  CREATININE  --   --   --   --   --   --   --   --   --  0.88  TROPONINI 1.35* 1.11*  --  0.90*  --   --   --   --   --   --   < > = values in this interval not displayed.  Estimated Creatinine Clearance: 98.3 mL/min (by C-G formula based on Cr of 0.88).   Assessment: 74 y.o. male admitted on 11/30/2014 presenting with CP.  Patient underwent cath 5/16  revealing severe stenosis of LAD, OM, & RCA requiring CABG.  Patient to continue heparin gtt until surgery Friday 12/05/14.    Hgb 12.7 stable (baseline 14.1). Plts stable 149.    Heparin level remains therapeutic at 0.56.  No reports of bleeding at this time.    Goal of Therapy:  Heparin level 0.3-0.7 units/ml Monitor platelets by anticoagulation protocol: Yes   Plan:  Continue heparin at 1900 units/hr Daily heparin level and CBC Monitor for signs and symptoms of bleeding  Russ HaloAshley Dmiyah Liscano, PharmD Clinical Pharmacist - Resident Pager: [phone] 5/18/20161:44 PM

## 2014-12-03 NOTE — Progress Notes (Signed)
CARDIAC REHAB PHASE I   PRE:  Rate/Rhythm: 69 SR  BP:  Supine:   Sitting: 118/73  Standing:    SaO2: 99%RA  MODE:  Ambulation: 700 ft   POST:  Rate/Rhythm: 84SR  BP:  Supine:   Sitting: 116/70  Standing:    SaO2: 99%RA 1340-1405 Pt walked 700 ft with steady gait. Tolerated well. No CP. To sitting on side of bed after walk.   Luetta Nuttingharlene Jakeira Seeman, RN BSN  12/03/2014 2:04 PM

## 2014-12-03 NOTE — Progress Notes (Signed)
Patient Name: [name] Date of Encounter: 12/03/2014  Active Problems:   NSTEMI (non-ST elevated myocardial infarction)   Length of Stay: 3  SUBJECTIVE  No more chest pain since the admission.  CURRENT MEDS . antiseptic oral rinse  7 mL Mouth Rinse BID  . aspirin EC  81 mg Oral Daily  . atorvastatin  40 mg Oral q1800  . doxazosin  4 mg Oral Daily  . insulin aspart  0-15 Units Subcutaneous TID WC  . lisinopril  5 mg Oral Daily  . sodium chloride  3 mL Intravenous Q12H   . heparin 1,900 Units/hr (12/03/14 0000)  . nitroGLYCERIN 20 mcg/min (12/03/14 0934)   OBJECTIVE  Filed Vitals:   12/03/14 0745 12/03/14 0800 12/03/14 0900 12/03/14 1000  BP:  143/103 162/106 121/93  Pulse:  102 57 115  Temp: 98.3 F (36.8 C)     TempSrc: Oral     Resp: 16 16 14 26   Height:      Weight:      SpO2: 91% 95% 99% 97%    Intake/Output Summary (Last 24 hours) at 12/03/14 1059 Last data filed at 12/03/14 1000  Gross per 24 hour  Intake 876.57 ml  Output    790 ml  Net  86.57 ml   Filed Weights   11/30/14 0600 12/01/14 0500 12/01/14 1703  Weight: 260 lb 12.9 oz (118.3 kg) 261 lb 11 oz (118.7 kg) 271 lb 2.7 oz (123 kg)   PHYSICAL EXAM  General: Pleasant, NAD. Neuro: Alert and oriented X 3. Moves all extremities spontaneously. Psych: Normal affect. HEENT:  Normal  Neck: Supple without bruits or JVD. Lungs:  Resp regular and unlabored, CTA. Heart: RRR no s3, s4, or murmurs. Abdomen: Soft, non-tender, non-distended, BS + x 4.  Extremities: No clubbing, cyanosis or edema. DP/PT/Radials 2+ and equal bilaterally.  Accessory Clinical Findings  CBC  Recent Labs  12/02/14 0220 12/03/14 0227  WBC 9.3 8.0  HGB 12.5* 12.7*  HCT 36.7* 36.4*  MCV 89.7 89.2  PLT 155 143*   Basic Metabolic Panel  Recent Labs  12/03/14 0227  NA 137  K 3.3*  CL 104  CO2 23  GLUCOSE 149*  BUN 10  CREATININE 0.88  CALCIUM 9.1   Liver Function Tests  Recent Labs   12/03/14 0227  AST 23  ALT 24  ALKPHOS 72  BILITOT 1.6*  PROT 6.5  ALBUMIN 3.5     Recent Labs  11/30/14 1517 11/30/14 1803 11/30/14 2123  TROPONINI 1.35* 1.11* 0.90*   Dg Chest 2 View  11/30/2014   CLINICAL DATA:  Acute onset of left-sided chest pain and left arm numbness. Initial encounter.  EXAM: CHEST  2 VIEW  COMPARISON:  None.  FINDINGS: The lungs are well-aerated. Vascular congestion is noted, with mild bilateral atelectasis. There is no evidence of pleural effusion or pneumothorax.  The heart is mildly enlarged. No acute osseous abnormalities are seen.  IMPRESSION: Vascular congestion and mild cardiomegaly, with mild bilateral atelectasis.   Electronically Signed   By: Roanna RaiderJeffery  Chang M.D.   On: 11/30/2014 02:35   TELE: SB  ECG: SB, inferior MI age undetermined  Echo:  Left ventricle: The cavity size was normal. Systolic function was normal. The estimated ejection fraction was in the range of 50% to 55%. Probable mild hypokinesis of the mid-apicalinferior myocardium. Doppler parameters are consistent with abnormal left ventricular relaxation (grade 1 diastolic dysfunction). - Mitral valve: Calcified annulus. There was mild regurgitation. - Left  atrium: The atrium was mildly dilated. - Right atrium: The atrium was mildly dilated.  Cardiac cath: FINAL CONCLUSION:  Severe diffuse three-vessel coronary artery disease with severe calcific stenosis of the LAD, severe stenosis of the second OM branch, and total occlusion of a large, dominant RCA with the presence of left-to-right collaterals  Mild segmental LV contraction abnormality with preserved overall LVEF  RECOMMENDATIONS:  Cardiac surgery consultation for consideration of CABG  Will transfer patient to A 2 Heart stepdown bed, resume IV heparin, and continue IV NTG.   ASSESSMENT AND PLAN  1. NSTEMI - 0.05 --> 0.21 --> 1.2 --> 1.35 --> 1.1, cath showed severe 3-VD, LVEF preserved.  Scheduled for CABG  on Friday 12/05/14.   We will continue ASA, atorvastatin, heparin and NTG drip. Chest pain free.  2. Atrial tachycardia on telemetry with ventricular rate 126 BPM. I will start low dose carvedilol 3.125 mg po BID.  3. HTN - started on lisinopril 5 mg po daily, BP controlled  4. Lipids - on atorvastatin 40 mg po daily, LDL 98, TG 78, HDL 42  Signed, Lars MassonNELSON, Joby Richart H MD, [facility] 12/03/2014

## 2014-12-04 ENCOUNTER — Inpatient Hospital Stay (HOSPITAL_COMMUNITY): Payer: Medicare Other

## 2014-12-04 DIAGNOSIS — I25110 Atherosclerotic heart disease of native coronary artery with unstable angina pectoris: Secondary | ICD-10-CM

## 2014-12-04 LAB — SPIROMETRY WITH GRAPH
FEF 25-75 Post: 4.85 L/sec
FEF 25-75 Pre: 2.87 L/sec
FEF2575-%Change-Post: 68 %
FEF2575-%PRED-POST: 201 %
FEF2575-%Pred-Pre: 119 %
FEV1-%Change-Post: 14 %
FEV1-%Pred-Post: 87 %
FEV1-%Pred-Pre: 76 %
FEV1-Post: 2.92 L
FEV1-Pre: 2.55 L
FEV1FVC-%Change-Post: 11 %
FEV1FVC-%Pred-Pre: 112 %
FEV6-%Change-Post: 3 %
FEV6-%PRED-PRE: 72 %
FEV6-%Pred-Post: 74 %
FEV6-POST: 3.2 L
FEV6-Pre: 3.11 L
FEV6FVC-%CHANGE-POST: 0 %
FEV6FVC-%Pred-Post: 106 %
FEV6FVC-%Pred-Pre: 106 %
FVC-%Change-Post: 2 %
FVC-%PRED-POST: 70 %
FVC-%Pred-Pre: 68 %
FVC-PRE: 3.11 L
FVC-Post: 3.2 L
PRE FEV6/FVC RATIO: 100 %
Post FEV1/FVC ratio: 91 %
Post FEV6/FVC ratio: 100 %
Pre FEV1/FVC ratio: 82 %

## 2014-12-04 LAB — GLUCOSE, CAPILLARY
GLUCOSE-CAPILLARY: 162 mg/dL — AB (ref 65–99)
Glucose-Capillary: 162 mg/dL — ABNORMAL HIGH (ref 65–99)
Glucose-Capillary: 141 mg/dL — ABNORMAL HIGH (ref 65–99)

## 2014-12-04 LAB — CBC
HEMATOCRIT: 35.1 % — AB (ref 39.0–52.0)
HEMOGLOBIN: 12.1 g/dL — AB (ref 13.0–17.0)
MCH: 30.7 pg (ref 26.0–34.0)
MCHC: 34.5 g/dL (ref 30.0–36.0)
MCV: 89.1 fL (ref 78.0–100.0)
Platelets: 150 10*3/uL (ref 150–400)
RBC: 3.94 MIL/uL — AB (ref 4.22–5.81)
RDW: 13 % (ref 11.5–15.5)
WBC: 6.1 10*3/uL (ref 4.0–10.5)

## 2014-12-04 LAB — SURGICAL PCR SCREEN
MRSA, PCR: NEGATIVE
STAPHYLOCOCCUS AUREUS: NEGATIVE

## 2014-12-04 LAB — TYPE AND SCREEN
ABO/RH(D): AB NEG
Antibody Screen: NEGATIVE

## 2014-12-04 LAB — HEPARIN LEVEL (UNFRACTIONATED): HEPARIN UNFRACTIONATED: 0.61 [IU]/mL (ref 0.30–0.70)

## 2014-12-04 LAB — ABO/RH: ABO/RH(D): AB NEG

## 2014-12-04 MED ORDER — METOPROLOL TARTRATE 12.5 MG HALF TABLET
12.5000 mg | ORAL_TABLET | Freq: Once | ORAL | Status: AC
  Administered 2014-12-05: 12.5 mg via ORAL
  Filled 2014-12-04: qty 1

## 2014-12-04 MED ORDER — NITROGLYCERIN IN D5W 200-5 MCG/ML-% IV SOLN
2.0000 ug/min | INTRAVENOUS | Status: DC
  Filled 2014-12-04: qty 250

## 2014-12-04 MED ORDER — VANCOMYCIN HCL 10 G IV SOLR
1250.0000 mg | INTRAVENOUS | Status: AC
  Administered 2014-12-05: 1250 mg via INTRAVENOUS
  Filled 2014-12-04: qty 1250

## 2014-12-04 MED ORDER — PLASMA-LYTE 148 IV SOLN
INTRAVENOUS | Status: AC
  Administered 2014-12-05: 500 mL
  Filled 2014-12-04: qty 2.5

## 2014-12-04 MED ORDER — ALBUTEROL SULFATE (2.5 MG/3ML) 0.083% IN NEBU
2.5000 mg | INHALATION_SOLUTION | Freq: Once | RESPIRATORY_TRACT | Status: AC
  Administered 2014-12-04: 2.5 mg via RESPIRATORY_TRACT

## 2014-12-04 MED ORDER — SODIUM CHLORIDE 0.9 % IV SOLN
INTRAVENOUS | Status: DC
  Filled 2014-12-04: qty 30

## 2014-12-04 MED ORDER — MAGNESIUM SULFATE 50 % IJ SOLN
40.0000 meq | INTRAMUSCULAR | Status: DC
  Filled 2014-12-04: qty 10

## 2014-12-04 MED ORDER — DEXMEDETOMIDINE HCL IN NACL 400 MCG/100ML IV SOLN
0.1000 ug/kg/h | INTRAVENOUS | Status: AC
  Administered 2014-12-05: .3 ug/kg/h via INTRAVENOUS
  Filled 2014-12-04: qty 100

## 2014-12-04 MED ORDER — TEMAZEPAM 15 MG PO CAPS: 15.0000 mg | ORAL_CAPSULE | Freq: Once | ORAL | Status: AC | PRN

## 2014-12-04 MED ORDER — EPINEPHRINE HCL 1 MG/ML IJ SOLN
0.0000 ug/min | INTRAVENOUS | Status: DC
  Filled 2014-12-04: qty 4

## 2014-12-04 MED ORDER — CHLORHEXIDINE GLUCONATE 4 % EX LIQD
60.0000 mL | Freq: Once | CUTANEOUS | Status: AC
  Administered 2014-12-04: 4 via TOPICAL
  Filled 2014-12-04: qty 60

## 2014-12-04 MED ORDER — POTASSIUM CHLORIDE 2 MEQ/ML IV SOLN
80.0000 meq | INTRAVENOUS | Status: DC
  Filled 2014-12-04: qty 40

## 2014-12-04 MED ORDER — PHENYLEPHRINE HCL 10 MG/ML IJ SOLN
30.0000 ug/min | INTRAVENOUS | Status: DC
  Filled 2014-12-04: qty 2

## 2014-12-04 MED ORDER — DEXTROSE 5 % IV SOLN
1.5000 g | INTRAVENOUS | Status: AC
  Administered 2014-12-05: .75 g via INTRAVENOUS
  Administered 2014-12-05: 1.5 g via INTRAVENOUS
  Filled 2014-12-04: qty 1.5

## 2014-12-04 MED ORDER — VANCOMYCIN HCL 1000 MG IV SOLR
INTRAVENOUS | Status: AC
  Administered 2014-12-05: 1000 mL
  Filled 2014-12-04: qty 1000

## 2014-12-04 MED ORDER — BISACODYL 5 MG PO TBEC
5.0000 mg | DELAYED_RELEASE_TABLET | Freq: Once | ORAL | Status: AC
  Administered 2014-12-04: 5 mg via ORAL
  Filled 2014-12-04: qty 1

## 2014-12-04 MED ORDER — SODIUM CHLORIDE 0.9 % IV SOLN
INTRAVENOUS | Status: AC
  Administered 2014-12-05: 70 mL/h via INTRAVENOUS
  Filled 2014-12-04 (×2): qty 40

## 2014-12-04 MED ORDER — CHLORHEXIDINE GLUCONATE 4 % EX LIQD
60.0000 mL | Freq: Once | CUTANEOUS | Status: AC
  Administered 2014-12-05: 4 via TOPICAL
  Filled 2014-12-04: qty 15
  Filled 2014-12-04: qty 60

## 2014-12-04 MED ORDER — DOPAMINE-DEXTROSE 3.2-5 MG/ML-% IV SOLN
0.0000 ug/kg/min | INTRAVENOUS | Status: DC
  Filled 2014-12-04: qty 250

## 2014-12-04 MED ORDER — DEXTROSE 5 % IV SOLN
750.0000 mg | INTRAVENOUS | Status: DC
  Filled 2014-12-04: qty 750

## 2014-12-04 MED ORDER — SODIUM CHLORIDE 0.9 % IV SOLN
INTRAVENOUS | Status: AC
  Administered 2014-12-05: 1 [IU]/h via INTRAVENOUS
  Filled 2014-12-04: qty 2.5

## 2014-12-04 NOTE — Progress Notes (Signed)
Respiratory therapy at bedside performing pulmonary function test called nurse to bedside patients HR 120's with noted Chest pain rating 4 of 10 with pressure .  BP 145/108 HR 120. Nitro gtt increased to 3225mcg/hr O2 started at 2L per [place]. Dr Darel HongNeson paged with no response. Huey BienenstockBrian Hager PA paged and made aware of the above findings. Nitro increased to 30 mcg @ this time, patient states pain is better rating pain @3 . Will continue to monitor per [facility]  instructions.

## 2014-12-04 NOTE — Progress Notes (Signed)
      [address]       [place] [id number]             [phone]     CARDIOTHORACIC SURGERY PROGRESS NOTE  3 Days Post-Op  S/P Procedure(s) (LRB): Left Heart Cath and Coronary Angiography (N/A)  Subjective: Feels well.  Had a brief episode of chest pain earlier today during PFT's, which were aborted.  No chest pain since.    Objective: Vital signs in last 24 hours: Temp:  [97.5 F (36.4 C)-98.5 F (36.9 C)] 97.5 F (36.4 C) (05/19 1203) Pulse Rate:  [49-61] 54 (05/19 1203) Cardiac Rhythm:  [-] Normal sinus rhythm;Sinus bradycardia (05/19 0800) Resp:  [12-21] 21 (05/19 1203) BP: (105-158)/(67-111) 105/71 mmHg (05/19 1203) SpO2:  [92 %-100 %] 97 % (05/19 1203) Weight:  [123.6 kg (272 lb 7.8 oz)] 123.6 kg (272 lb 7.8 oz) (05/19 0442)  Physical Exam:  Rhythm:   sinus  Breath sounds: clear  Heart sounds:  RRR  Incisions:  n/a  Abdomen:  soft  Extremities:  Warm, well-perfused   Intake/Output from previous day: 05/18 0701 - 05/19 0700 In: 1299.5 [P.O.:360; I.V.:579.5] Out: 875 [Urine:875] Intake/Output this shift: Total I/O In: 75 [I.V.:75] Out: -   Lab Results:  Recent Labs  12/03/14 0227 12/04/14 1125  WBC 8.0 6.1  HGB 12.7* 12.1*  HCT 36.4* 35.1*  PLT 143* 150   BMET:  Recent Labs  12/03/14 0227  NA 137  K 3.3*  CL 104  CO2 23  GLUCOSE 149*  BUN 10  CREATININE 0.88  CALCIUM 9.1    CBG (last 3)   Recent Labs  12/03/14 1713 12/03/14 2109 12/04/14 0745  GLUCAP 121* 163* 162*   PT/INR:  No results for input(s): LABPROT, INR in the last 72 hours.  CXR:  N/A  Assessment/Plan: S/P Procedure(s) (LRB): Left Heart Cath and Coronary Angiography (N/A)  For CABG in am tomorrow.  All questions answered.  I spent in excess of 15 minutes during the conduct of this hospital encounter and >50% of this time involved direct face-to-face encounter with the patient for counseling and/or coordination of their care.    Jeremy NailsClarence H  Clarke 12/04/2014 2:23 PM

## 2014-12-04 NOTE — Progress Notes (Signed)
[facility] Date of Encounter: 12/04/2014  Active Problems:   NSTEMI (non-ST elevated myocardial infarction)   Length of Stay: 4  SUBJECTIVE  Tachycardia and mild chest pain during PFTs today, now chest pain free.  CURRENT MEDS . antiseptic oral rinse  7 mL Mouth Rinse BID  . aspirin EC  81 mg Oral Daily  . atorvastatin  40 mg Oral q1800  . carvedilol  3.125 mg Oral BID WC  . doxazosin  4 mg Oral Daily  . insulin aspart  0-15 Units Subcutaneous TID WC  . lisinopril  5 mg Oral Daily  . sodium chloride  3 mL Intravenous Q12H   . heparin 1,900 Units/hr (12/04/14 0700)  . nitroGLYCERIN 20 mcg/min (12/04/14 0700)   OBJECTIVE  Filed Vitals:   12/04/14 0442 12/04/14 0746 12/04/14 0815 12/04/14 1203  BP: 114/68 146/89  105/71  Pulse: 49 56 61 54  Temp: 97.6 F (36.4 C) 97.7 F (36.5 C)  97.5 F (36.4 C)  TempSrc: Oral Oral  Oral  Resp: 18 16  21   Height:      Weight: 272 lb 7.8 oz (123.6 kg)     SpO2: 98% 98%  97%    Intake/Output Summary (Last 24 hours) at 12/04/14 1426 Last data filed at 12/04/14 1000  Gross per 24 hour  Intake    500 ml  Output    675 ml  Net   -175 ml   Filed Weights   12/01/14 0500 12/01/14 1703 12/04/14 0442  Weight: 261 lb 11 oz (118.7 kg) 271 lb 2.7 oz (123 kg) 272 lb 7.8 oz (123.6 kg)   PHYSICAL EXAM  General: Pleasant, NAD. Neuro: Alert and oriented X 3. Moves all extremities spontaneously. Psych: Normal affect. HEENT:  Normal  Neck: Supple without bruits or JVD. Lungs:  Resp regular and unlabored, CTA. Heart: RRR no s3, s4, or murmurs. Abdomen: Soft, non-tender, non-distended, BS + x 4.  Extremities: No clubbing, cyanosis or edema. DP/PT/Radials 2+ and equal bilaterally.  Accessory Clinical Findings  CBC  Recent Labs  12/03/14 0227 12/04/14 1125  WBC 8.0 6.1  HGB 12.7* 12.1*  HCT 36.4* 35.1*  MCV 89.2 89.1  PLT 143* 150   Basic Metabolic Panel  Recent Labs  12/03/14 0227  NA 137  K 3.3*    CL 104  CO2 23  GLUCOSE 149*  BUN 10  CREATININE 0.88  CALCIUM 9.1   Liver Function Tests  Recent Labs  12/03/14 0227  AST 23  ALT 24  ALKPHOS 72  BILITOT 1.6*  PROT 6.5  ALBUMIN 3.5    No results for input(s): CKTOTAL, CKMB, CKMBINDEX, TROPONINI in the last 72 hours. Dg Chest 2 View  11/30/2014   CLINICAL DATA:  Acute onset of left-sided chest pain and left arm numbness. Initial encounter.  EXAM: CHEST  2 VIEW  COMPARISON:  None.  FINDINGS: The lungs are well-aerated. Vascular congestion is noted, with mild bilateral atelectasis. There is no evidence of pleural effusion or pneumothorax.  The heart is mildly enlarged. No acute osseous abnormalities are seen.  IMPRESSION: Vascular congestion and mild cardiomegaly, with mild bilateral atelectasis.   Electronically Signed   By: Roanna RaiderJeffery  Chang M.D.   On: 11/30/2014 02:35   TELE: SB  ECG: SB, inferior MI age undetermined  Echo:  Left ventricle: The cavity size was normal. Systolic function was normal. The estimated ejection fraction was in the range of 50% to 55%. Probable mild hypokinesis of the  mid-apicalinferior myocardium. Doppler parameters are consistent with abnormal left ventricular relaxation (grade 1 diastolic dysfunction). - Mitral valve: Calcified annulus. There was mild regurgitation. - Left atrium: The atrium was mildly dilated. - Right atrium: The atrium was mildly dilated.  Cardiac cath: FINAL CONCLUSION:  Severe diffuse three-vessel coronary artery disease with severe calcific stenosis of the LAD, severe stenosis of the second OM branch, and total occlusion of a large, dominant RCA with the presence of left-to-right collaterals  Mild segmental LV contraction abnormality with preserved overall LVEF  RECOMMENDATIONS:  Cardiac surgery consultation for consideration of CABG  Will transfer patient to A 2 Heart stepdown bed, resume IV heparin, and continue IV NTG.    ASSESSMENT AND PLAN  1.  NSTEMI - 0.05 --> 0.21 --> 1.2 --> 1.35 --> 1.1, cath showed severe 3-VD, LVEF preserved.  Scheduled for CABG on Friday 12/05/14.  We will continue ASA, atorvastatin, heparin and NTG drip. Mild CP during PFTs this am, now asymptomatic.  2. Atrial tachycardia on telemetry with ventricular rate 126 BPM. Resolved after starting carvedilol 3.125 mg po BID.  3. HTN - started on lisinopril 5 mg po daily, BP controlled  4. Lipids - on atorvastatin 40 mg po daily, LDL 98, TG 78, HDL 42  Signed, Lars MassonNELSON, Marthena Whitmyer H MD, [facility] 12/04/2014

## 2014-12-04 NOTE — Progress Notes (Signed)
While performing pt pre CABG PFT his HR increased from 78 to 128 & he began to feel some pressure in his chest. Test stopped immediately & RN notified. RN came in to assist & MD called.   [name], Miklo Aken David RRT

## 2014-12-04 NOTE — Progress Notes (Signed)
ANTICOAGULATION CONSULT NOTE - Follow Up Consult  Pharmacy Consult for Heparin Indication: NSTEMI  No Known Allergies  Patient Measurements: Height: 6' (182.9 cm) Weight: 272 lb 7.8 oz (123.6 kg) IBW/kg (Calculated) : 77.6 Heparin Dosing Weight:  103.5 kg  Vital Signs: Temp: 97.7 F (36.5 C) (05/19 0746) Temp Source: Oral (05/19 0746) BP: 146/89 mmHg (05/19 0746) Pulse Rate: 61 (05/19 0815)  Labs:  Recent Labs  12/02/14 0220  12/02/14 1705 12/03/14 0227 12/04/14 0258  HGB 12.5*  --   --  12.7*  --   HCT 36.7*  --   --  36.4*  --   PLT 155  --   --  143*  --   HEPARINUNFRC <0.10*  < > 0.70 0.56 0.61  CREATININE  --   --   --  0.88  --   < > = values in this interval not displayed.  Estimated Creatinine Clearance: 98.5 mL/min (by C-G formula based on Cr of 0.88).   Assessment: 74 y.o. male admitted on 11/30/2014 presenting with CP.  Patient underwent cath 5/16  revealing severe stenosis of LAD, OM, & RCA requiring CABG.  Patient to continue heparin gtt until surgery Friday 12/05/14.    Hgb 12.7 stable (baseline 14.1). Plts stable 143.    Heparin level remains therapeutic at 0.61.  No reports of bleeding at this time.    Goal of Therapy:  Heparin level 0.3-0.7 units/ml Monitor platelets by anticoagulation protocol: Yes   Plan:  Continue heparin at 1900 units/hr Daily heparin level and CBC Monitor for signs and symptoms of bleeding  Red ChristiansSamson Odin Mariani, Pharm. D. Clinical Pharmacy Resident Pager: [phone] Ph: [phone] 12/04/2014 10:42 AM

## 2014-12-04 NOTE — Progress Notes (Signed)
Noted CP earlier and increased NTG. Will hold ambulation today. Will f/u post op. Ethelda ChickKristan Oseas Detty CES, ACSM 2:35 PM 12/04/2014

## 2014-12-05 ENCOUNTER — Inpatient Hospital Stay (HOSPITAL_COMMUNITY): Payer: Medicare Other | Admitting: Anesthesiology

## 2014-12-05 ENCOUNTER — Encounter (HOSPITAL_COMMUNITY): Payer: Self-pay | Admitting: Thoracic Surgery (Cardiothoracic Vascular Surgery)

## 2014-12-05 ENCOUNTER — Inpatient Hospital Stay (HOSPITAL_COMMUNITY): Payer: Medicare Other

## 2014-12-05 ENCOUNTER — Encounter (HOSPITAL_COMMUNITY)
Admission: EM | Disposition: A | Discharge status: Home / Self Care | Payer: Medicare Other | Source: Home / Self Care | Attending: Thoracic Surgery (Cardiothoracic Vascular Surgery)

## 2014-12-05 DIAGNOSIS — Z951 Presence of aortocoronary bypass graft: Secondary | ICD-10-CM

## 2014-12-05 HISTORY — DX: Presence of aortocoronary bypass graft: Z95.1

## 2014-12-05 HISTORY — PX: TEE WITHOUT CARDIOVERSION: SHX5443

## 2014-12-05 HISTORY — PX: CORONARY ARTERY BYPASS GRAFT: SHX141

## 2014-12-05 LAB — CBC
HCT: 34.6 % — ABNORMAL LOW (ref 39.0–52.0)
HCT: 30 % — ABNORMAL LOW (ref 39.0–52.0)
HCT: 29.5 % — ABNORMAL LOW (ref 39.0–52.0)
HEMOGLOBIN: 10.3 g/dL — AB (ref 13.0–17.0)
Hemoglobin: 12.1 g/dL — ABNORMAL LOW (ref 13.0–17.0)
Hemoglobin: 10.4 g/dL — ABNORMAL LOW (ref 13.0–17.0)
MCH: 31.2 pg (ref 26.0–34.0)
MCH: 30.8 pg (ref 26.0–34.0)
MCH: 31.4 pg (ref 26.0–34.0)
MCHC: 35 g/dL (ref 30.0–36.0)
MCHC: 34.7 g/dL (ref 30.0–36.0)
MCHC: 34.9 g/dL (ref 30.0–36.0)
MCV: 89.2 fL (ref 78.0–100.0)
MCV: 88.8 fL (ref 78.0–100.0)
MCV: 89.9 fL (ref 78.0–100.0)
PLATELETS: 176 10*3/uL (ref 150–400)
Platelets: 113 10*3/uL — ABNORMAL LOW (ref 150–400)
Platelets: 134 10*3/uL — ABNORMAL LOW (ref 150–400)
RBC: 3.88 MIL/uL — ABNORMAL LOW (ref 4.22–5.81)
RBC: 3.38 MIL/uL — ABNORMAL LOW (ref 4.22–5.81)
RBC: 3.28 MIL/uL — AB (ref 4.22–5.81)
RDW: 13.1 % (ref 11.5–15.5)
RDW: 13.2 % (ref 11.5–15.5)
RDW: 13.4 % (ref 11.5–15.5)
WBC: 6.9 10*3/uL (ref 4.0–10.5)
WBC: 7.6 10*3/uL (ref 4.0–10.5)
WBC: 10 10*3/uL (ref 4.0–10.5)

## 2014-12-05 LAB — GLUCOSE, CAPILLARY
GLUCOSE-CAPILLARY: 97 mg/dL (ref 65–99)
GLUCOSE-CAPILLARY: 113 mg/dL — AB (ref 65–99)
GLUCOSE-CAPILLARY: 128 mg/dL — AB (ref 65–99)
Glucose-Capillary: 165 mg/dL — ABNORMAL HIGH (ref 65–99)
Glucose-Capillary: 100 mg/dL — ABNORMAL HIGH (ref 65–99)

## 2014-12-05 LAB — PLATELET COUNT: Platelets: 128 10*3/uL — ABNORMAL LOW (ref 150–400)

## 2014-12-05 LAB — PROTIME-INR
INR: 1.44 (ref 0.00–1.49)
Prothrombin Time: 17.6 seconds — ABNORMAL HIGH (ref 11.6–15.2)

## 2014-12-05 LAB — POCT I-STAT, CHEM 8
BUN: 13 mg/dL (ref 6–20)
BUN: 12 mg/dL (ref 6–20)
BUN: 11 mg/dL (ref 6–20)
BUN: 11 mg/dL (ref 6–20)
BUN: 12 mg/dL (ref 6–20)
BUN: 10 mg/dL (ref 6–20)
CALCIUM ION: 1.29 mmol/L (ref 1.13–1.30)
CALCIUM ION: 1.17 mmol/L (ref 1.13–1.30)
CALCIUM ION: 1.21 mmol/L (ref 1.13–1.30)
CHLORIDE: 103 mmol/L (ref 101–111)
CHLORIDE: 103 mmol/L (ref 101–111)
CHLORIDE: 101 mmol/L (ref 101–111)
Calcium, Ion: 1.28 mmol/L (ref 1.13–1.30)
Calcium, Ion: 1.11 mmol/L — ABNORMAL LOW (ref 1.13–1.30)
Calcium, Ion: 1.13 mmol/L (ref 1.13–1.30)
Chloride: 100 mmol/L — ABNORMAL LOW (ref 101–111)
Chloride: 100 mmol/L — ABNORMAL LOW (ref 101–111)
Chloride: 107 mmol/L (ref 101–111)
Creatinine, Ser: 0.7 mg/dL (ref 0.61–1.24)
Creatinine, Ser: 0.7 mg/dL (ref 0.61–1.24)
Creatinine, Ser: 0.7 mg/dL (ref 0.61–1.24)
Creatinine, Ser: 0.7 mg/dL (ref 0.61–1.24)
Creatinine, Ser: 0.7 mg/dL (ref 0.61–1.24)
Creatinine, Ser: 0.8 mg/dL (ref 0.61–1.24)
GLUCOSE: 158 mg/dL — AB (ref 65–99)
Glucose, Bld: 176 mg/dL — ABNORMAL HIGH (ref 65–99)
Glucose, Bld: 176 mg/dL — ABNORMAL HIGH (ref 65–99)
Glucose, Bld: 159 mg/dL — ABNORMAL HIGH (ref 65–99)
Glucose, Bld: 159 mg/dL — ABNORMAL HIGH (ref 65–99)
Glucose, Bld: 130 mg/dL — ABNORMAL HIGH (ref 65–99)
HCT: 26 % — ABNORMAL LOW (ref 39.0–52.0)
HCT: 29 % — ABNORMAL LOW (ref 39.0–52.0)
HEMATOCRIT: 32 % — AB (ref 39.0–52.0)
HEMATOCRIT: 31 % — AB (ref 39.0–52.0)
HEMATOCRIT: 28 % — AB (ref 39.0–52.0)
HEMATOCRIT: 27 % — AB (ref 39.0–52.0)
HEMOGLOBIN: 9.2 g/dL — AB (ref 13.0–17.0)
HEMOGLOBIN: 9.9 g/dL — AB (ref 13.0–17.0)
Hemoglobin: 10.9 g/dL — ABNORMAL LOW (ref 13.0–17.0)
Hemoglobin: 10.5 g/dL — ABNORMAL LOW (ref 13.0–17.0)
Hemoglobin: 8.8 g/dL — ABNORMAL LOW (ref 13.0–17.0)
Hemoglobin: 9.5 g/dL — ABNORMAL LOW (ref 13.0–17.0)
POTASSIUM: 3.6 mmol/L (ref 3.5–5.1)
POTASSIUM: 5.9 mmol/L — AB (ref 3.5–5.1)
Potassium: 3.8 mmol/L (ref 3.5–5.1)
Potassium: 5.2 mmol/L — ABNORMAL HIGH (ref 3.5–5.1)
Potassium: 4.2 mmol/L (ref 3.5–5.1)
Potassium: 4.6 mmol/L (ref 3.5–5.1)
SODIUM: 135 mmol/L (ref 135–145)
Sodium: 138 mmol/L (ref 135–145)
Sodium: 138 mmol/L (ref 135–145)
Sodium: 133 mmol/L — ABNORMAL LOW (ref 135–145)
Sodium: 136 mmol/L (ref 135–145)
Sodium: 140 mmol/L (ref 135–145)
TCO2: 21 mmol/L (ref 0–100)
TCO2: 22 mmol/L (ref 0–100)
TCO2: 21 mmol/L (ref 0–100)
TCO2: 20 mmol/L (ref 0–100)
TCO2: 20 mmol/L (ref 0–100)
TCO2: 20 mmol/L (ref 0–100)

## 2014-12-05 LAB — POCT I-STAT 3, ART BLOOD GAS (G3+)
Acid-base deficit: 2 mmol/L (ref 0.0–2.0)
Acid-base deficit: 2 mmol/L (ref 0.0–2.0)
BICARBONATE: 23.6 meq/L (ref 20.0–24.0)
BICARBONATE: 22.6 meq/L (ref 20.0–24.0)
O2 Saturation: 100 %
O2 Saturation: 91 %
PCO2 ART: 37.2 mmHg (ref 35.0–45.0)
PH ART: 7.362 (ref 7.350–7.450)
PH ART: 7.387 (ref 7.350–7.450)
PO2 ART: 359 mmHg — AB (ref 80.0–100.0)
Patient temperature: 36
TCO2: 25 mmol/L (ref 0–100)
TCO2: 24 mmol/L (ref 0–100)
pCO2 arterial: 41.5 mmHg (ref 35.0–45.0)
pO2, Arterial: 57 mmHg — ABNORMAL LOW (ref 80.0–100.0)

## 2014-12-05 LAB — MAGNESIUM: MAGNESIUM: 2.7 mg/dL — AB (ref 1.7–2.4)

## 2014-12-05 LAB — CREATININE, SERUM
Creatinine, Ser: 0.85 mg/dL (ref 0.61–1.24)
GFR calc non Af Amer: >60 mL/min (ref >60)

## 2014-12-05 LAB — POCT I-STAT 4, (NA,K, GLUC, HGB,HCT)
GLUCOSE: 128 mg/dL — AB (ref 65–99)
HEMATOCRIT: 30 % — AB (ref 39.0–52.0)
Hemoglobin: 10.2 g/dL — ABNORMAL LOW (ref 13.0–17.0)
Potassium: 3.6 mmol/L (ref 3.5–5.1)
Sodium: 139 mmol/L (ref 135–145)

## 2014-12-05 LAB — APTT: APTT: 38 s — AB (ref 24–37)

## 2014-12-05 LAB — HEPARIN LEVEL (UNFRACTIONATED): HEPARIN UNFRACTIONATED: 0.73 [IU]/mL — AB (ref 0.30–0.70)

## 2014-12-05 LAB — HEMOGLOBIN AND HEMATOCRIT, BLOOD
HCT: 26.9 % — ABNORMAL LOW (ref 39.0–52.0)
Hemoglobin: 9.4 g/dL — ABNORMAL LOW (ref 13.0–17.0)

## 2014-12-05 SURGERY — CORONARY ARTERY BYPASS GRAFTING (CABG)
Anesthesia: General | Site: Chest

## 2014-12-05 MED ORDER — DOCUSATE SODIUM 100 MG PO CAPS
200.0000 mg | ORAL_CAPSULE | Freq: Every day | ORAL | Status: DC
  Administered 2014-12-06 – 2014-12-13 (×6): 200 mg via ORAL
  Filled 2014-12-05 (×10): qty 2

## 2014-12-05 MED ORDER — SODIUM CHLORIDE 0.9 % IV SOLN
INTRAVENOUS | Status: DC
  Administered 2014-12-05: 18:00:00 via INTRAVENOUS
  Filled 2014-12-05 (×2): qty 2.5

## 2014-12-05 MED ORDER — LIDOCAINE HCL (CARDIAC) 20 MG/ML IV SOLN
INTRAVENOUS | Status: DC | PRN
  Administered 2014-12-05: 100 mg via INTRAVENOUS

## 2014-12-05 MED ORDER — VECURONIUM BROMIDE 10 MG IV SOLR
INTRAVENOUS | Status: DC | PRN
  Administered 2014-12-05 (×2): 5 mg via INTRAVENOUS
  Administered 2014-12-05: 10 mg via INTRAVENOUS
  Administered 2014-12-05 (×3): 5 mg via INTRAVENOUS

## 2014-12-05 MED ORDER — SODIUM CHLORIDE 0.9 % IJ SOLN
INTRAMUSCULAR | Status: AC
  Filled 2014-12-05: qty 20

## 2014-12-05 MED ORDER — EPHEDRINE SULFATE 50 MG/ML IJ SOLN
INTRAMUSCULAR | Status: AC
  Filled 2014-12-05: qty 1

## 2014-12-05 MED ORDER — ACETAMINOPHEN 160 MG/5ML PO SOLN: 650.0000 mg | Freq: Once | ORAL | Status: AC

## 2014-12-05 MED ORDER — ACETAMINOPHEN 500 MG PO TABS
1000.0000 mg | ORAL_TABLET | Freq: Four times a day (QID) | ORAL | Status: AC
  Administered 2014-12-06 – 2014-12-10 (×19): 1000 mg via ORAL
  Filled 2014-12-05 (×19): qty 2

## 2014-12-05 MED ORDER — FENTANYL CITRATE (PF) 250 MCG/5ML IJ SOLN
INTRAMUSCULAR | Status: AC
  Filled 2014-12-05: qty 5

## 2014-12-05 MED ORDER — MIDAZOLAM HCL 5 MG/5ML IJ SOLN
INTRAMUSCULAR | Status: DC | PRN
  Administered 2014-12-05: 2 mg via INTRAVENOUS
  Administered 2014-12-05 (×2): 3 mg via INTRAVENOUS
  Administered 2014-12-05: 4 mg via INTRAVENOUS
  Administered 2014-12-05: 5 mg via INTRAVENOUS
  Administered 2014-12-05: 3 mg via INTRAVENOUS

## 2014-12-05 MED ORDER — PROPOFOL 10 MG/ML IV BOLUS
INTRAVENOUS | Status: DC | PRN
  Administered 2014-12-05: 100 mg via INTRAVENOUS

## 2014-12-05 MED ORDER — VANCOMYCIN HCL IN DEXTROSE 1-5 GM/200ML-% IV SOLN
1000.0000 mg | Freq: Once | INTRAVENOUS | Status: AC
  Administered 2014-12-05: 1000 mg via INTRAVENOUS
  Filled 2014-12-05: qty 200

## 2014-12-05 MED ORDER — ACETAMINOPHEN 160 MG/5ML PO SOLN
1000.0000 mg | Freq: Four times a day (QID) | ORAL | Status: DC
  Administered 2014-12-05: 1000 mg
  Filled 2014-12-05: qty 40.6

## 2014-12-05 MED ORDER — LACTATED RINGERS IV SOLN
INTRAVENOUS | Status: DC
  Administered 2014-12-05: 17:00:00 via INTRAVENOUS

## 2014-12-05 MED ORDER — DEXTROSE 5 % IV SOLN
1.5000 g | Freq: Two times a day (BID) | INTRAVENOUS | Status: AC
  Administered 2014-12-05 – 2014-12-07 (×4): 1.5 g via INTRAVENOUS
  Filled 2014-12-05 (×4): qty 1.5

## 2014-12-05 MED ORDER — SODIUM CHLORIDE 0.9 % IJ SOLN
3.0000 mL | Freq: Two times a day (BID) | INTRAMUSCULAR | Status: DC
  Administered 2014-12-06 – 2014-12-07 (×4): 3 mL via INTRAVENOUS

## 2014-12-05 MED ORDER — DEXMEDETOMIDINE HCL IN NACL 400 MCG/100ML IV SOLN
0.1000 ug/kg/h | INTRAVENOUS | Status: DC
  Filled 2014-12-05: qty 100

## 2014-12-05 MED ORDER — SODIUM BICARBONATE 8.4 % IV SOLN
50.0000 meq | Freq: Once | INTRAVENOUS | Status: AC
  Administered 2014-12-05: 50 meq via INTRAVENOUS

## 2014-12-05 MED ORDER — LACTATED RINGERS IV SOLN: 500.0000 mL | Freq: Once | INTRAVENOUS | Status: AC | PRN

## 2014-12-05 MED ORDER — MAGNESIUM SULFATE 4 GM/100ML IV SOLN
4.0000 g | Freq: Once | INTRAVENOUS | Status: AC
  Administered 2014-12-05: 4 g via INTRAVENOUS
  Filled 2014-12-05: qty 100

## 2014-12-05 MED ORDER — TRAMADOL HCL 50 MG PO TABS: 50.0000 mg | ORAL_TABLET | ORAL | Status: DC | PRN

## 2014-12-05 MED ORDER — HEPARIN SODIUM (PORCINE) 1000 UNIT/ML IJ SOLN
INTRAMUSCULAR | Status: DC | PRN
  Administered 2014-12-05: 3000 [IU] via INTRAVENOUS
  Administered 2014-12-05: 37000 [IU] via INTRAVENOUS

## 2014-12-05 MED ORDER — ROCURONIUM BROMIDE 50 MG/5ML IV SOLN
INTRAVENOUS | Status: AC
  Filled 2014-12-05: qty 1

## 2014-12-05 MED ORDER — LACTATED RINGERS IV SOLN
INTRAVENOUS | Status: DC | PRN
  Administered 2014-12-05: 07:00:00 via INTRAVENOUS

## 2014-12-05 MED ORDER — METOPROLOL TARTRATE 25 MG/10 ML ORAL SUSPENSION
12.5000 mg | Freq: Two times a day (BID) | ORAL | Status: DC
  Filled 2014-12-05 (×9): qty 5

## 2014-12-05 MED ORDER — PROTAMINE SULFATE 10 MG/ML IV SOLN
INTRAVENOUS | Status: AC
  Filled 2014-12-05: qty 25

## 2014-12-05 MED ORDER — LIDOCAINE HCL (CARDIAC) 20 MG/ML IV SOLN
INTRAVENOUS | Status: AC
  Filled 2014-12-05: qty 10

## 2014-12-05 MED ORDER — OXYCODONE HCL 5 MG PO TABS
5.0000 mg | ORAL_TABLET | ORAL | Status: DC | PRN
  Administered 2014-12-06 – 2014-12-07 (×3): 10 mg via ORAL
  Filled 2014-12-05 (×4): qty 2

## 2014-12-05 MED ORDER — ALBUMIN HUMAN 5 % IV SOLN
250.0000 mL | INTRAVENOUS | Status: AC | PRN
  Administered 2014-12-05 (×3): 250 mL via INTRAVENOUS
  Filled 2014-12-05: qty 250

## 2014-12-05 MED ORDER — ASPIRIN 81 MG PO CHEW: 324.0000 mg | CHEWABLE_TABLET | Freq: Every day | ORAL | Status: DC

## 2014-12-05 MED ORDER — MORPHINE SULFATE 2 MG/ML IJ SOLN
2.0000 mg | INTRAMUSCULAR | Status: DC | PRN
  Administered 2014-12-06 (×4): 2 mg via INTRAVENOUS
  Filled 2014-12-05 (×4): qty 1

## 2014-12-05 MED ORDER — FENTANYL CITRATE (PF) 100 MCG/2ML IJ SOLN
INTRAMUSCULAR | Status: DC | PRN
  Administered 2014-12-05 (×2): 250 ug via INTRAVENOUS
  Administered 2014-12-05: 400 ug via INTRAVENOUS
  Administered 2014-12-05: 100 ug via INTRAVENOUS
  Administered 2014-12-05 (×2): 250 ug via INTRAVENOUS
  Administered 2014-12-05: 500 ug via INTRAVENOUS
  Administered 2014-12-05 (×2): 250 ug via INTRAVENOUS

## 2014-12-05 MED ORDER — 0.9 % SODIUM CHLORIDE (POUR BTL) OPTIME
TOPICAL | Status: DC | PRN
  Administered 2014-12-05: 6000 mL

## 2014-12-05 MED ORDER — FENTANYL CITRATE (PF) 250 MCG/5ML IJ SOLN
INTRAMUSCULAR | Status: AC
Start: 2014-12-05 — End: 2014-12-05
  Filled 2014-12-05: qty 5

## 2014-12-05 MED ORDER — SODIUM CHLORIDE 0.9 % IV SOLN
INTRAVENOUS | Status: AC
  Administered 2014-12-05: 15:00:00 via INTRAVENOUS

## 2014-12-05 MED ORDER — STERILE WATER FOR INJECTION IJ SOLN
INTRAMUSCULAR | Status: AC
  Filled 2014-12-05: qty 10

## 2014-12-05 MED ORDER — PHENYLEPHRINE 40 MCG/ML (10ML) SYRINGE FOR IV PUSH (FOR BLOOD PRESSURE SUPPORT)
PREFILLED_SYRINGE | INTRAVENOUS | Status: AC
  Filled 2014-12-05: qty 20

## 2014-12-05 MED ORDER — ACETAMINOPHEN 650 MG RE SUPP
650.0000 mg | Freq: Once | RECTAL | Status: AC
  Administered 2014-12-05: 650 mg via RECTAL

## 2014-12-05 MED ORDER — SODIUM CHLORIDE 0.9 % IV SOLN: INTRAVENOUS | Status: DC

## 2014-12-05 MED ORDER — SODIUM CHLORIDE 0.45 % IV SOLN
INTRAVENOUS | Status: DC | PRN
  Administered 2014-12-05: 15:00:00 via INTRAVENOUS

## 2014-12-05 MED ORDER — CHLORHEXIDINE GLUCONATE 0.12 % MT SOLN
15.0000 mL | Freq: Two times a day (BID) | OROMUCOSAL | Status: DC
  Administered 2014-12-05: 15 mL via OROMUCOSAL
  Filled 2014-12-05: qty 15

## 2014-12-05 MED ORDER — PROTAMINE SULFATE 10 MG/ML IV SOLN
INTRAVENOUS | Status: AC
  Filled 2014-12-05: qty 5

## 2014-12-05 MED ORDER — POTASSIUM CHLORIDE 10 MEQ/50ML IV SOLN
10.0000 meq | INTRAVENOUS | Status: AC
  Administered 2014-12-05 (×3): 10 meq via INTRAVENOUS

## 2014-12-05 MED ORDER — HEPARIN SODIUM (PORCINE) 1000 UNIT/ML IJ SOLN
INTRAMUSCULAR | Status: AC
  Filled 2014-12-05: qty 1

## 2014-12-05 MED ORDER — PROPOFOL 10 MG/ML IV BOLUS
INTRAVENOUS | Status: AC
  Filled 2014-12-05: qty 20

## 2014-12-05 MED ORDER — SODIUM CHLORIDE 0.9 % IJ SOLN: 3.0000 mL | INTRAMUSCULAR | Status: DC | PRN

## 2014-12-05 MED ORDER — PANTOPRAZOLE SODIUM 40 MG PO TBEC
40.0000 mg | DELAYED_RELEASE_TABLET | Freq: Every day | ORAL | Status: DC
  Administered 2014-12-07 – 2014-12-14 (×7): 40 mg via ORAL
  Filled 2014-12-05 (×7): qty 1

## 2014-12-05 MED ORDER — METOPROLOL TARTRATE 12.5 MG HALF TABLET
12.5000 mg | ORAL_TABLET | Freq: Two times a day (BID) | ORAL | Status: DC
  Administered 2014-12-06 – 2014-12-08 (×6): 12.5 mg via ORAL
  Filled 2014-12-05 (×10): qty 1

## 2014-12-05 MED ORDER — CETYLPYRIDINIUM CHLORIDE 0.05 % MT LIQD
7.0000 mL | Freq: Four times a day (QID) | OROMUCOSAL | Status: DC
  Administered 2014-12-05 – 2014-12-06 (×3): 7 mL via OROMUCOSAL

## 2014-12-05 MED ORDER — VECURONIUM BROMIDE 10 MG IV SOLR
INTRAVENOUS | Status: AC
  Filled 2014-12-05: qty 10

## 2014-12-05 MED ORDER — NITROGLYCERIN IN D5W 200-5 MCG/ML-% IV SOLN: 0.0000 ug/min | INTRAVENOUS | Status: DC

## 2014-12-05 MED ORDER — ARTIFICIAL TEARS OP OINT
TOPICAL_OINTMENT | OPHTHALMIC | Status: AC
  Filled 2014-12-05: qty 3.5

## 2014-12-05 MED ORDER — ASPIRIN EC 325 MG PO TBEC
325.0000 mg | DELAYED_RELEASE_TABLET | Freq: Every day | ORAL | Status: DC
  Administered 2014-12-06 – 2014-12-11 (×6): 325 mg via ORAL
  Filled 2014-12-05 (×7): qty 1

## 2014-12-05 MED ORDER — ONDANSETRON HCL 4 MG/2ML IJ SOLN
4.0000 mg | Freq: Four times a day (QID) | INTRAMUSCULAR | Status: DC | PRN
Start: 2014-12-05 — End: 2014-12-14

## 2014-12-05 MED ORDER — DEXMEDETOMIDINE HCL IN NACL 200 MCG/50ML IV SOLN
0.0000 ug/kg/h | INTRAVENOUS | Status: DC
  Administered 2014-12-05: 0.1 ug/kg/h via INTRAVENOUS
  Filled 2014-12-05: qty 50

## 2014-12-05 MED ORDER — FAMOTIDINE IN NACL 20-0.9 MG/50ML-% IV SOLN
20.0000 mg | Freq: Two times a day (BID) | INTRAVENOUS | Status: AC
  Administered 2014-12-05 (×2): 20 mg via INTRAVENOUS
  Filled 2014-12-05: qty 50

## 2014-12-05 MED ORDER — LACTATED RINGERS IV SOLN
INTRAVENOUS | Status: DC
  Administered 2014-12-06: via INTRAVENOUS

## 2014-12-05 MED ORDER — HEMOSTATIC AGENTS (NO CHARGE) OPTIME
TOPICAL | Status: DC | PRN
  Administered 2014-12-05: 1 via TOPICAL

## 2014-12-05 MED ORDER — MIDAZOLAM HCL 2 MG/2ML IJ SOLN
2.0000 mg | INTRAMUSCULAR | Status: DC | PRN
Start: 2014-12-05 — End: 2014-12-06

## 2014-12-05 MED ORDER — BISACODYL 5 MG PO TBEC
10.0000 mg | DELAYED_RELEASE_TABLET | Freq: Every day | ORAL | Status: DC
  Administered 2014-12-06 – 2014-12-11 (×5): 10 mg via ORAL
  Filled 2014-12-05 (×6): qty 2

## 2014-12-05 MED ORDER — PHENYLEPHRINE HCL 10 MG/ML IJ SOLN
0.0000 ug/min | INTRAVENOUS | Status: DC
  Administered 2014-12-05: 50 ug/min via INTRAVENOUS
  Filled 2014-12-05 (×2): qty 2

## 2014-12-05 MED ORDER — PROTAMINE SULFATE 10 MG/ML IV SOLN
INTRAVENOUS | Status: DC | PRN
  Administered 2014-12-05: 300 mg via INTRAVENOUS

## 2014-12-05 MED ORDER — SODIUM CHLORIDE 0.9 % IV SOLN: 250.0000 mL | INTRAVENOUS | Status: DC

## 2014-12-05 MED ORDER — ALBUMIN HUMAN 5 % IV SOLN
INTRAVENOUS | Status: DC | PRN
  Administered 2014-12-05 (×2): via INTRAVENOUS

## 2014-12-05 MED ORDER — MIDAZOLAM HCL 10 MG/2ML IJ SOLN
INTRAMUSCULAR | Status: AC
  Filled 2014-12-05: qty 2

## 2014-12-05 MED ORDER — BISACODYL 10 MG RE SUPP
10.0000 mg | Freq: Every day | RECTAL | Status: DC
  Administered 2014-12-13: 10 mg via RECTAL

## 2014-12-05 MED ORDER — INSULIN REGULAR BOLUS VIA INFUSION
0.0000 [IU] | Freq: Three times a day (TID) | INTRAVENOUS | Status: DC
  Filled 2014-12-05: qty 10

## 2014-12-05 MED ORDER — VECURONIUM BROMIDE 10 MG IV SOLR
INTRAVENOUS | Status: AC
  Filled 2014-12-05: qty 20

## 2014-12-05 MED ORDER — SODIUM CHLORIDE 0.9 % IJ SOLN
OROMUCOSAL | Status: DC | PRN
  Administered 2014-12-05 (×3): 4 mL via TOPICAL

## 2014-12-05 MED ORDER — METOPROLOL TARTRATE 1 MG/ML IV SOLN
2.5000 mg | INTRAVENOUS | Status: DC | PRN
  Filled 2014-12-05: qty 5

## 2014-12-05 MED ORDER — MORPHINE SULFATE 2 MG/ML IJ SOLN
1.0000 mg | INTRAMUSCULAR | Status: DC | PRN
  Administered 2014-12-05: 2 mg via INTRAVENOUS
  Filled 2014-12-05: qty 1

## 2014-12-05 MED ORDER — ARTIFICIAL TEARS OP OINT
TOPICAL_OINTMENT | OPHTHALMIC | Status: DC | PRN
  Administered 2014-12-05: 1 via OPHTHALMIC

## 2014-12-05 SURGICAL SUPPLY — 110 items
APPLIER CLIP 9.375 SM OPEN (CLIP) ×3
APR CLP SM 9.3 20 MLT OPN (CLIP) ×2
BAG DECANTER FOR FLEXI CONT (MISCELLANEOUS) ×6 IMPLANT
BANDAGE ELASTIC 4 VELCRO ST LF (GAUZE/BANDAGES/DRESSINGS) ×4 IMPLANT
BANDAGE ELASTIC 6 VELCRO ST LF (GAUZE/BANDAGES/DRESSINGS) ×4 IMPLANT
BASKET HEART (ORDER IN 25'S) (MISCELLANEOUS) ×1
BASKET HEART (ORDER IN 25S) (MISCELLANEOUS) ×2 IMPLANT
BLADE STERNUM SYSTEM 6 (BLADE) ×3 IMPLANT
BLADE SURG ROTATE 9660 (MISCELLANEOUS) IMPLANT
BNDG GAUZE ELAST 4 BULKY (GAUZE/BANDAGES/DRESSINGS) ×5 IMPLANT
CANISTER SUCTION 2500CC (MISCELLANEOUS) ×3 IMPLANT
CANNULA EZ GLIDE 8.0 24FR (CANNULA) ×1 IMPLANT
CANNULA EZ GLIDE AORTIC 21FR (CANNULA) ×6 IMPLANT
CATH CPB KIT OWEN (MISCELLANEOUS) ×3 IMPLANT
CATH THORACIC 36FR (CATHETERS) ×3 IMPLANT
CLIP APPLIE 9.375 SM OPEN (CLIP) IMPLANT
CLIP FOGARTY SPRING 6M (CLIP) ×1 IMPLANT
CLIP RETRACTION 3.0MM CORONARY (MISCELLANEOUS) ×1 IMPLANT
CLIP TI MEDIUM 24 (CLIP) IMPLANT
CLIP TI WIDE RED SMALL 24 (CLIP) IMPLANT
CRADLE DONUT ADULT HEAD (MISCELLANEOUS) ×3 IMPLANT
DRAIN CHANNEL 32F RND 10.7 FF (WOUND CARE) ×6 IMPLANT
DRAPE CARDIOVASCULAR INCISE (DRAPES) ×3
DRAPE INCISE IOBAN 66X45 STRL (DRAPES) ×3 IMPLANT
DRAPE SLUSH/WARMER DISC (DRAPES) ×3 IMPLANT
DRAPE SRG 135X102X78XABS (DRAPES) ×2 IMPLANT
DRSG AQUACEL AG ADV 3.5X14 (GAUZE/BANDAGES/DRESSINGS) ×2 IMPLANT
DRSG COVADERM 4X14 (GAUZE/BANDAGES/DRESSINGS) ×3 IMPLANT
ELECT BLADE 4.0 EZ CLEAN MEGAD (MISCELLANEOUS) ×3
ELECT REM PT RETURN 9FT ADLT (ELECTROSURGICAL) ×6
ELECTRODE BLDE 4.0 EZ CLN MEGD (MISCELLANEOUS) IMPLANT
ELECTRODE REM PT RTRN 9FT ADLT (ELECTROSURGICAL) ×4 IMPLANT
GAUZE SPONGE 4X4 12PLY STRL (GAUZE/BANDAGES/DRESSINGS) ×7 IMPLANT
GLOVE BIO SURGEON STRL SZ 6 (GLOVE) ×2 IMPLANT
GLOVE BIO SURGEON STRL SZ 6.5 (GLOVE) ×2 IMPLANT
GLOVE BIO SURGEON STRL SZ7 (GLOVE) ×1 IMPLANT
GLOVE BIO SURGEON STRL SZ7.5 (GLOVE) ×2 IMPLANT
GLOVE BIOGEL PI IND STRL 6 (GLOVE) ×1 IMPLANT
GLOVE BIOGEL PI IND STRL 6.5 (GLOVE) IMPLANT
GLOVE BIOGEL PI IND STRL 7.0 (GLOVE) IMPLANT
GLOVE BIOGEL PI INDICATOR 6 (GLOVE) ×1
GLOVE BIOGEL PI INDICATOR 6.5 (GLOVE) ×3
GLOVE BIOGEL PI INDICATOR 7.0 (GLOVE) ×1
GLOVE ORTHO TXT STRL SZ7.5 (GLOVE) ×6 IMPLANT
GLOVE SS PI 9.0 STRL (GLOVE) ×1 IMPLANT
GOWN STRL REUS W/ TWL LRG LVL3 (GOWN DISPOSABLE) ×14 IMPLANT
GOWN STRL REUS W/TWL LRG LVL3 (GOWN DISPOSABLE) ×30
HEMOSTAT POWDER SURGIFOAM 1G (HEMOSTASIS) ×9 IMPLANT
INSERT FOGARTY XLG (MISCELLANEOUS) ×4 IMPLANT
KIT BASIN OR (CUSTOM PROCEDURE TRAY) ×3 IMPLANT
KIT ROOM TURNOVER OR (KITS) ×3 IMPLANT
KIT SUCTION CATH 14FR (SUCTIONS) ×9 IMPLANT
KIT VASOVIEW W/TROCAR VH 2000 (KITS) ×3 IMPLANT
LEAD PACING MYOCARDI (MISCELLANEOUS) ×3 IMPLANT
MARKER GRAFT CORONARY BYPASS (MISCELLANEOUS) ×9 IMPLANT
NS IRRIG 1000ML POUR BTL (IV SOLUTION) ×16 IMPLANT
PACK OPEN HEART (CUSTOM PROCEDURE TRAY) ×3 IMPLANT
PACK TRANSFER 300ML (TOM) (MISCELLANEOUS) ×4 IMPLANT
PAD ARMBOARD 7.5X6 YLW CONV (MISCELLANEOUS) ×8 IMPLANT
PAD ELECT DEFIB RADIOL ZOLL (MISCELLANEOUS) ×3 IMPLANT
PENCIL BUTTON HOLSTER BLD 10FT (ELECTRODE) ×3 IMPLANT
PUNCH AORTIC ROT 4.0MM RCL 40 (MISCELLANEOUS) ×1 IMPLANT
PUNCH AORTIC ROTATE 4.0MM (MISCELLANEOUS) IMPLANT
PUNCH AORTIC ROTATE 4.5MM 8IN (MISCELLANEOUS) IMPLANT
PUNCH AORTIC ROTATE 5MM 8IN (MISCELLANEOUS) IMPLANT
SET CARDIOPLEGIA MPS 5001102 (MISCELLANEOUS) ×1 IMPLANT
SET Y EXTENSION LINE CSP (IV SETS) ×1 IMPLANT
SOLUTION ANTI FOG 6CC (MISCELLANEOUS) IMPLANT
SPONGE LAP 18X18 X RAY DECT (DISPOSABLE) ×2 IMPLANT
SPONGE LAP 4X18 X RAY DECT (DISPOSABLE) IMPLANT
SUT BONE WAX W31G (SUTURE) ×3 IMPLANT
SUT ETHIBOND X763 2 0 SH 1 (SUTURE) ×6 IMPLANT
SUT MNCRL AB 3-0 PS2 18 (SUTURE) ×6 IMPLANT
SUT MNCRL AB 4-0 PS2 18 (SUTURE) IMPLANT
SUT PDS AB 1 CTX 36 (SUTURE) ×6 IMPLANT
SUT PROLENE 2 0 SH DA (SUTURE) IMPLANT
SUT PROLENE 3 0 SH DA (SUTURE) ×7 IMPLANT
SUT PROLENE 3 0 SH1 36 (SUTURE) IMPLANT
SUT PROLENE 4 0 RB 1 (SUTURE) ×6
SUT PROLENE 4 0 SH DA (SUTURE) IMPLANT
SUT PROLENE 4-0 RB1 .5 CRCL 36 (SUTURE) IMPLANT
SUT PROLENE 5 0 C 1 36 (SUTURE) IMPLANT
SUT PROLENE 6 0 C 1 30 (SUTURE) ×2 IMPLANT
SUT PROLENE 7.0 RB 3 (SUTURE) ×11 IMPLANT
SUT PROLENE 8 0 BV175 6 (SUTURE) ×1 IMPLANT
SUT PROLENE BLUE 7 0 (SUTURE) ×4 IMPLANT
SUT PROLENE POLY MONO (SUTURE) ×4 IMPLANT
SUT SILK  1 MH (SUTURE) ×2
SUT SILK 1 MH (SUTURE) ×2 IMPLANT
SUT STEEL 6MS V (SUTURE) IMPLANT
SUT STEEL STERNAL CCS#1 18IN (SUTURE) IMPLANT
SUT STEEL SZ 6 DBL 3X14 BALL (SUTURE) IMPLANT
SUT VIC AB 1 CTX 36 (SUTURE)
SUT VIC AB 1 CTX36XBRD ANBCTR (SUTURE) IMPLANT
SUT VIC AB 2-0 CT1 27 (SUTURE) ×6
SUT VIC AB 2-0 CT1 TAPERPNT 27 (SUTURE) IMPLANT
SUT VIC AB 2-0 CTX 27 (SUTURE) ×1 IMPLANT
SUT VIC AB 3-0 SH 27 (SUTURE)
SUT VIC AB 3-0 SH 27X BRD (SUTURE) IMPLANT
SUT VIC AB 3-0 X1 27 (SUTURE) ×4 IMPLANT
SUT VICRYL 4-0 PS2 18IN ABS (SUTURE) IMPLANT
SUTURE E-PAK OPEN HEART (SUTURE) ×3 IMPLANT
SYSTEM SAHARA CHEST DRAIN ATS (WOUND CARE) ×3 IMPLANT
TAPE CLOTH SURG 4X10 WHT LF (GAUZE/BANDAGES/DRESSINGS) ×1 IMPLANT
TOWEL OR 17X24 6PK STRL BLUE (TOWEL DISPOSABLE) ×6 IMPLANT
TOWEL OR 17X26 10 PK STRL BLUE (TOWEL DISPOSABLE) ×6 IMPLANT
TRAY FOLEY IC TEMP SENS 16FR (CATHETERS) ×3 IMPLANT
TUBING INSUFFLATION (TUBING) ×3 IMPLANT
UNDERPAD 30X30 INCONTINENT (UNDERPADS AND DIAPERS) ×3 IMPLANT
WATER STERILE IRR 1000ML POUR (IV SOLUTION) ×6 IMPLANT

## 2014-12-05 NOTE — Op Note (Signed)
CARDIOTHORACIC SURGERY OPERATIVE NOTE  Date of Procedure: 12/05/2014  Preoperative Diagnosis:   Severe 3-vessel Coronary Artery Disease  S/P Acute non-STEMI  Postoperative Diagnosis: Same  Procedure:    Coronary Artery Bypass Grafting x 5  Left Internal Mammary Artery to Distal Left Anterior Descending Coronary Artery  Saphenous Vein Graft to Posterior Descending Coronary Artery  Saphenous Vein Graft to First Obtuse Marginal Branch of Left Circumflex Coronary Artery  Sequential Saphenous Vein Graft to Second Obtuse Marginal Branch of Left Circumflex Coronary Artery  Sapheonous Vein Graft to Diagonal Branch Coronary Artery  Endoscopic Vein Harvest from Bilateral Thighs and Right Lower Leg  Surgeon: Salvatore Decent. Cornelius Moras, MD  Assistant: Rowe Clack, PA-C  Anesthesia: Quita Skye. Krista Blue, MD  Operative Findings:  Normal LV systolic function  Good quality LIMA conduit for grafting  Fair to good quality SVG conduit for grafting  Diffuse CAD but good quality target vessels for grafting     BRIEF CLINICAL NOTE AND INDICATIONS FOR SURGERY  Patient is a 74 year old obese white male with no previous history of coronary artery disease but risk factors notable for history of hypertension, type 2 diabetes mellitus, and hyperlipidemia was admitted to the hospital on 11/30/2014 with a prolonged episode of substernal chest pain and has ruled in for an acute non-ST segment elevation myocardial infarction and has now been referred for surgical consultation to discuss treatment options for management of severe three-vessel coronary artery disease. The patient states that he first began to experience intermittent episodes of substernal chest discomfort approximately 6 months ago. For the most part these symptoms have been associated with physical activity and relieved by rest. Symptoms progressed recently, and during the early morning hours of 11/30/2014 the patient was awoken from his sleep with  substernal chest pain radiating across the left chest. Symptoms persisted for more than an hour, prompting the patient to call EMS. The patient took multiple aspirin tablets, and by the time he reached the emergency department his chest pain had subsided. Baseline electrocardiogram revealed sinus rhythm without acute ST segment changes. The patient ruled in for acute myocardial infarction based on serial troponin levels with a peak troponin measured 1.66. Limited Transthoracic echocardiogram performed at the time of admission revealed mild left ventricular systolic dysfunction with inferior wall hypokinesis. The patient has remained clinically stable with no further episodes of chest discomfort on intravenous heparin and nitroglycerin. Diagnostic cardiac catheterization was performed by Dr. Excell Seltzer and revealed severe three-vessel coronary artery disease with mild left ventricular systolic dysfunction. Cardiothoracic surgical consultation was requested.  The patient has been seen in consultation and counseled at length regarding the indications, risks and potential benefits of surgery.  All questions have been answered, and the patient provides full informed consent for the operation as described.    DETAILS OF THE OPERATIVE PROCEDURE  Preparation:  The patient is brought to the operating room on the above mentioned date and central monitoring was established by the anesthesia team including placement of Swan-Ganz catheter and radial arterial line. The patient is placed in the supine position on the operating table.  Intravenous antibiotics are administered. General endotracheal anesthesia is induced uneventfully. A Foley catheter is placed.  Baseline transesophageal echocardiogram was performed.  Findings were notable for normal LV systolic function.  There was trace to mild mitral regurgitation.  The patient's chest, abdomen, both groins, and both lower extremities are prepared and draped in a sterile  manner. A time out procedure is performed.   Surgical Approach and Conduit  Harvest:  A median sternotomy incision was performed and the left internal mammary artery is dissected from the chest wall and prepared for bypass grafting. The left internal mammary artery is notably good quality conduit. Simultaneously, the greater saphenous vein is obtained from the patient's right thigh and right lower leg using endoscopic vein harvest technique. The saphenous vein is notably fairly good quality conduit, although portions were unusable.  Therefore, a second segment of saphenous vein was harvested from the left thigh using endoscopic vein harvest technique.  The second segment was good quality. After removal of the saphenous vein, the small surgical incisions in the lower extremity are closed with absorbable suture. Following systemic heparinization, the left internal mammary artery was transected distally noted to have excellent flow.   Extracorporeal Cardiopulmonary Bypass and Myocardial Protection:  The pericardium is opened. The ascending aorta is normal in appearance. The ascending aorta and the right atrium are cannulated for cardiopulmonary bypass.  Adequate heparinization is verified.  The entire pre-bypass portion of the operation was notable for stable hemodynamics.  Cardiopulmonary bypass was begun and the surface of the heart is inspected. Distal target vessels are selected for coronary artery bypass grafting. A cardioplegia cannula is placed in the ascending aorta.  A temperature probe was placed in the interventricular septum.  The patient is allowed to cool passively to [facility]32C systemic temperature.  The aortic cross clamp is applied and cold blood cardioplegia is delivered initially in an antegrade fashion through the aortic root.  Iced saline slush is applied for topical hypothermia.  The initial cardioplegic arrest is rapid with early diastolic arrest.  Repeat doses of cardioplegia are  administered intermittently throughout the entire cross clamp portion of the operation through the aortic root and through subsequently placed vein grafts in order to maintain completely flat electrocardiogram and septal myocardial temperature below 15C.  Myocardial protection was felt to be excellent.  Coronary Artery Bypass Grafting:   The posterior descending branch of the right coronary artery was grafted using a reversed saphenous vein graft in an end-to-side fashion.  At the site of distal anastomosis the target vessel was diffusely diseased and fairly good quality and measured approximately 1.5 mm in diameter.  The first obtuse marginal branch of the left circumflex coronary artery was grafted using a reversed saphenous vein graft in an end-to-side fashion.  At the site of distal anastomosis the target vessel was good quality and measured approximately 2.0 mm in diameter.  The second obtuse marginal branch of the left circumflex coronary artery was grafted in and end-to-side fashion using a sequential saphenous vein graft off of the distal segment of the vein graft placed to the first obtuse marginal branch coronary artery.  At the site of distal anastomosis the target vessel was good quality and measured approximately 1.5 mm in diameter.  The diagonal branch of the left anterior descending coronary artery was grafted using a reversed saphenous vein graft in an end-to-side fashion.  At the site of distal anastomosis the target vessel was fair quality and measured approximately 1.4 mm in diameter.  The distal left anterior coronary artery was grafted with the left internal mammary artery in an end-to-side fashion.  At the site of distal anastomosis the target vessel was good quality and measured approximately 1.7 mm in diameter.  All proximal vein graft anastomoses were placed directly to the ascending aorta prior to removal of the aortic cross clamp.  The septal myocardial temperature rose  rapidly after reperfusion of the left internal  mammary artery graft.  The aortic cross clamp was removed after a total cross clamp time of 91 minutes.   Procedure Completion:  All proximal and distal coronary anastomoses were inspected for hemostasis and appropriate graft orientation. Epicardial pacing wires are fixed to the right ventricular outflow tract and to the right atrial appendage. The patient is rewarmed to 37C temperature. The patient is weaned and disconnected from cardiopulmonary bypass.  The patient's rhythm at separation from bypass was AV paced.  The patient was weaned from cardiopulmonary bypass without any inotropic support. Total cardiopulmonary bypass time for the operation was 130 minutes.  Followup transesophageal echocardiogram performed after separation from bypass revealed no changes from the preoperative exam.  The aortic and venous cannula were removed uneventfully. Protamine was administered to reverse the anticoagulation. The mediastinum and pleural space were inspected for hemostasis and irrigated with saline solution. The mediastinum and the left pleural space were drained using 3 chest tubes placed through separate stab incisions inferiorly.  The soft tissues anterior to the aorta were reapproximated loosely. The sternum is closed with double strength sternal wire. The soft tissues anterior to the sternum were closed in multiple layers and the skin is closed with a running subcuticular skin closure.  The post-bypass portion of the operation was notable for stable rhythm and hemodynamics.  No blood products were administered during the operation.   Disposition:  The patient tolerated the procedure well and is transported to the surgical intensive care in stable condition. There are no intraoperative complications. All sponge instrument and needle counts are verified correct at completion of the operation.    Salvatore Decentlarence H. Cornelius Moraswen MD 12/05/2014 1:51 PM

## 2014-12-05 NOTE — Progress Notes (Signed)
*  PRELIMINARY RESULTS* Echocardiogram Echocardiogram Transesophageal has been performed.  Jeryl Columbialliott, Argel Pablo 12/05/2014, 8:15 AM

## 2014-12-05 NOTE — Anesthesia Postprocedure Evaluation (Signed)
  Anesthesia Post-op Note  Patient: [name]  Procedure(s) Performed: Procedure(s) with comments: CORONARY ARTERY BYPASS GRAFTING (CABG), ON PUMP, TIMES FIVE, USING LEFT INTERNAL MAMMARY ARTERY, BILATERAL GREATER SAPHENOUS VEIN HARVESTED ENDOSCOPICALLY (N/A) - LIMA-LAD; SEQ SVG-OM1-OM2; SVG-DIAG; SVG-PD TRANSESOPHAGEAL ECHOCARDIOGRAM (TEE) (N/A)  Patient Location: SICU  Anesthesia Type:General  Level of Consciousness: Patient remains intubated per anesthesia plan  Airway and Oxygen Therapy: Patient remains intubated per anesthesia plan and Patient placed on Ventilator (see vital sign flow sheet for setting)  Post-op Pain: none  Post-op Assessment: Post-op Vital signs reviewed, Patient's Cardiovascular Status Stable and Respiratory Function Stable  Post-op Vital Signs: Reviewed and stable  Last Vitals:  Filed Vitals:   12/05/14 0532  BP:   Pulse: 62  Temp:   Resp:     Complications: No apparent anesthesia complications

## 2014-12-05 NOTE — Brief Op Note (Addendum)
11/30/2014 - 12/05/2014      [address]       [place] [id number]             [phone]     11/30/2014 - 12/05/2014  12:15 PM  PATIENT:  Jeremy SilenceGeorge W Clarke  74 y.o. male  PRE-OPERATIVE DIAGNOSIS:  CAD  POST-OPERATIVE DIAGNOSIS:  CAD  PROCEDURE:  Procedure(s): CORONARY ARTERY BYPASS GRAFTING (CABG), ON PUMP, TIMES FIVE, USING LEFT INTERNAL MAMMARY ARTERY, BILATERAL GREATER SAPHENOUS VEIN HARVESTED ENDOSCOPICALLY (LIMA-LAD; SEQ SVG-OM1-OM2; SVG-DIAG; SVG-PD) TRANSESOPHAGEAL ECHOCARDIOGRAM (TEE)  SURGEON:    Purcell Nailslarence H Franchelle Foskett, MD  ASSISTANTS:  Rowe ClackWayne E Gold, PA-C  ANESTHESIA:   Heather RobertsJames Singer, MD  CROSSCLAMP TIME:   7491'  CARDIOPULMONARY BYPASS TIME: 130'  FINDINGS:  Normal LV systolic function  Good quality LIMA conduit for grafting  Fair to good quality SVG conduit for grafting  Diffuse CAD but good quality target vessels for grafting  COMPLICATIONS: None  BASELINE WEIGHT: 115 kg  PATIENT DISPOSITION:   TO SICU IN STABLE CONDITION  Purcell NailsClarence H Diamon Reddinger 12/05/2014 1:48 PM

## 2014-12-05 NOTE — Progress Notes (Signed)
      [address]       [place] [id number]             [phone]      Intubated, sedated, shivering  BP 104/63 mmHg  Pulse 80  Temp(Src) 97.3 F (36.3 C) (Core (Comment))  Resp 12  Ht 6' (1.829 m)  Wt 254 lb 3.1 oz (115.3 kg)  BMI 34.47 kg/m2  SpO2 100%   Intake/Output Summary (Last 24 hours) at 12/05/14 1711 Last data filed at 12/05/14 1627  Gross per 24 hour  Intake 5681.19 ml  Output   4715 ml  Net 966.19 ml   CI= 1.8  Minimal CT output  Hct = 30  Stable early postop  Mackynzie Woolford C. Dorris FetchHendrickson, MD [facility] [phone]

## 2014-12-05 NOTE — Anesthesia Procedure Notes (Signed)
Procedure Name: Intubation Date/Time: 12/05/2014 7:46 AM Performed by: Wray KearnsFOLEY, Lleyton Byers A Pre-anesthesia Checklist: Patient identified, Timeout performed, Emergency Drugs available, Suction available and Patient being monitored Patient Re-evaluated:Patient Re-evaluated prior to inductionOxygen Delivery Method: Circle system utilized Preoxygenation: Pre-oxygenation with 100% oxygen Intubation Type: IV induction and Cricoid Pressure applied Ventilation: Mask ventilation without difficulty Laryngoscope Size: Mac and 4 Grade View: Grade I Tube type: Oral Tube size: 8.0 mm Number of attempts: 1 Airway Equipment and Method: Stylet Placement Confirmation: ETT inserted through vocal cords under direct vision,  breath sounds checked- equal and bilateral and positive ETCO2 Secured at: 24 cm Tube secured with: Tape Dental Injury: Teeth and Oropharynx as per pre-operative assessment

## 2014-12-05 NOTE — Anesthesia Preprocedure Evaluation (Addendum)
Anesthesia Evaluation  Patient identified by MRN, date of birth, ID band Patient awake    Reviewed: Allergy & Precautions, NPO status , Patient's Chart, lab work & pertinent test results  Airway Mallampati: I  TM Distance: >3 FB Neck ROM: Full    Dental  (+) Edentulous Upper, Edentulous Lower   Pulmonary neg pulmonary ROS, former smoker,    Pulmonary exam normal       Cardiovascular hypertension, Pt. on medications + Past MI Normal cardiovascular exam Echo:  Left ventricle: The cavity size was normal. Systolic function was normal. The estimated ejection fraction was in the range of 50% to 55%. Probable mild hypokinesis of the mid-apicalinferior myocardium. Doppler parameters are consistent with abnormal left ventricular relaxation (grade 1 diastolic dysfunction). - Mitral valve: Calcified annulus. There was mild regurgitation. - Left atrium: The atrium was mildly dilated. - Right atrium: The atrium was mildly dilated.    Neuro/Psych negative neurological ROS  negative psych ROS   GI/Hepatic negative GI ROS, Neg liver ROS,   Endo/Other  diabetes  Renal/GU negative Renal ROS     Musculoskeletal   Abdominal   Peds  Hematology   Anesthesia Other Findings   Reproductive/Obstetrics                           Anesthesia Physical Anesthesia Plan  ASA: IV  Anesthesia Plan: General   Post-op Pain Management:    Induction: Intravenous  Airway Management Planned: Oral ETT  Additional Equipment: Arterial line, 3D TEE and Ultrasound Guidance Line Placement  Intra-op Plan:   Post-operative Plan: Post-operative intubation/ventilation  Informed Consent: I have reviewed the patients History and Physical, chart, labs and discussed the procedure including the risks, benefits and alternatives for the proposed anesthesia with the patient or authorized representative who has indicated his/her  understanding and acceptance.   Dental advisory given  Plan Discussed with: CRNA, Anesthesiologist and Surgeon  Anesthesia Plan Comments:        Anesthesia Quick Evaluation

## 2014-12-05 NOTE — Procedures (Signed)
Extubation Procedure Note  Patient Details:   Name: Jeremy MilletGeorge W Clarke DOB: 06-08-1941 MRN: [id number]   Airway Documentation:  Airway 8 mm (Active)  Secured at (cm) 22 cm 12/05/2014  7:00 PM  Measured From Lips 12/05/2014  7:00 PM  Secured Location Right 12/05/2014  7:00 PM  Secured By Pink Tape 12/05/2014  7:00 PM  Site Condition Dry 12/05/2014  2:25 PM    Evaluation  O2 sats: stable throughout Complications: No apparent complications Patient did tolerate procedure well. Bilateral Breath Sounds: Clear, Diminished  Patient weaned per rapid wean protocol.  Patient follows commands, sticks out tongue and lifts head off pillow.  NIF -20, VC .7 L.  Patient extubated to 4L nasal cannula without incident.  RN at bedside.  Patient able to vocalize, sats 100%, IS performed 750 x 6.   Donnelly AngelicaMcCollum, Annaston Upham Ann 12/05/2014, 11:15 PM

## 2014-12-05 NOTE — OR Nursing (Signed)
Off-pump call to SICU charge nurse at 1240.

## 2014-12-05 NOTE — OR Nursing (Signed)
40 minute call to SICU at 1304.

## 2014-12-05 NOTE — OR Nursing (Signed)
20 minute call to SICU at 1334.

## 2014-12-05 NOTE — Transfer of Care (Signed)
Immediate Anesthesia Transfer of Care Note  Patient: [name]  Procedure(s) Performed: Procedure(s) with comments: CORONARY ARTERY BYPASS GRAFTING (CABG), ON PUMP, TIMES FIVE, USING LEFT INTERNAL MAMMARY ARTERY, BILATERAL GREATER SAPHENOUS VEIN HARVESTED ENDOSCOPICALLY (N/A) - LIMA-LAD; SEQ SVG-OM1-OM2; SVG-DIAG; SVG-PD TRANSESOPHAGEAL ECHOCARDIOGRAM (TEE) (N/A)  Patient Location: SICU  Anesthesia Type:General  Level of Consciousness: Patient remains intubated per anesthesia plan  Airway & Oxygen Therapy: Patient remains intubated per anesthesia plan and Patient placed on Ventilator (see vital sign flow sheet for setting)  Post-op Assessment: Report given to RN and Post -op Vital signs reviewed and stable  Post vital signs: Reviewed and stable  Last Vitals:  Filed Vitals:   12/05/14 0532  BP:   Pulse: 62  Temp:   Resp:     Complications: No apparent anesthesia complications

## 2014-12-06 ENCOUNTER — Inpatient Hospital Stay (HOSPITAL_COMMUNITY): Payer: Medicare Other

## 2014-12-06 DIAGNOSIS — Z951 Presence of aortocoronary bypass graft: Secondary | ICD-10-CM

## 2014-12-06 LAB — GLUCOSE, CAPILLARY
GLUCOSE-CAPILLARY: 119 mg/dL — AB (ref 65–99)
GLUCOSE-CAPILLARY: 132 mg/dL — AB (ref 65–99)
GLUCOSE-CAPILLARY: 119 mg/dL — AB (ref 65–99)
Glucose-Capillary: 116 mg/dL — ABNORMAL HIGH (ref 65–99)
Glucose-Capillary: 122 mg/dL — ABNORMAL HIGH (ref 65–99)
Glucose-Capillary: 124 mg/dL — ABNORMAL HIGH (ref 65–99)
Glucose-Capillary: 125 mg/dL — ABNORMAL HIGH (ref 65–99)
Glucose-Capillary: 126 mg/dL — ABNORMAL HIGH (ref 65–99)
Glucose-Capillary: 136 mg/dL — ABNORMAL HIGH (ref 65–99)
Glucose-Capillary: 111 mg/dL — ABNORMAL HIGH (ref 65–99)
Glucose-Capillary: 114 mg/dL — ABNORMAL HIGH (ref 65–99)
Glucose-Capillary: 117 mg/dL — ABNORMAL HIGH (ref 65–99)
Glucose-Capillary: 128 mg/dL — ABNORMAL HIGH (ref 65–99)
Glucose-Capillary: 101 mg/dL — ABNORMAL HIGH (ref 65–99)
Glucose-Capillary: 115 mg/dL — ABNORMAL HIGH (ref 65–99)
Glucose-Capillary: 125 mg/dL — ABNORMAL HIGH (ref 65–99)
Glucose-Capillary: 205 mg/dL — ABNORMAL HIGH (ref 65–99)
Glucose-Capillary: 159 mg/dL — ABNORMAL HIGH (ref 65–99)

## 2014-12-06 LAB — POCT I-STAT 3, ART BLOOD GAS (G3+)
ACID-BASE DEFICIT: 1 mmol/L (ref 0.0–2.0)
Acid-base deficit: 3 mmol/L — ABNORMAL HIGH (ref 0.0–2.0)
Bicarbonate: 22.1 mEq/L (ref 20.0–24.0)
Bicarbonate: 24.3 mEq/L — ABNORMAL HIGH (ref 20.0–24.0)
O2 SAT: 98 %
O2 SAT: 98 %
PH ART: 7.331 — AB (ref 7.350–7.450)
TCO2: 23 mmol/L (ref 0–100)
TCO2: 25 mmol/L (ref 0–100)
pCO2 arterial: 41.2 mmHg (ref 35.0–45.0)
pCO2 arterial: 42.2 mmHg (ref 35.0–45.0)
pH, Arterial: 7.381 (ref 7.350–7.450)
pO2, Arterial: 108 mmHg — ABNORMAL HIGH (ref 80.0–100.0)
pO2, Arterial: 108 mmHg — ABNORMAL HIGH (ref 80.0–100.0)

## 2014-12-06 LAB — CBC
HCT: 29.6 % — ABNORMAL LOW (ref 39.0–52.0)
HCT: 30 % — ABNORMAL LOW (ref 39.0–52.0)
Hemoglobin: 10.1 g/dL — ABNORMAL LOW (ref 13.0–17.0)
Hemoglobin: 10.1 g/dL — ABNORMAL LOW (ref 13.0–17.0)
MCH: 30.9 pg (ref 26.0–34.0)
MCH: 30.6 pg (ref 26.0–34.0)
MCHC: 34.1 g/dL (ref 30.0–36.0)
MCHC: 33.7 g/dL (ref 30.0–36.0)
MCV: 90.5 fL (ref 78.0–100.0)
MCV: 90.9 fL (ref 78.0–100.0)
PLATELETS: 122 10*3/uL — AB (ref 150–400)
Platelets: 121 10*3/uL — ABNORMAL LOW (ref 150–400)
RBC: 3.27 MIL/uL — AB (ref 4.22–5.81)
RBC: 3.3 MIL/uL — ABNORMAL LOW (ref 4.22–5.81)
RDW: 13.3 % (ref 11.5–15.5)
RDW: 13.6 % (ref 11.5–15.5)
WBC: 8.8 10*3/uL (ref 4.0–10.5)
WBC: 9.9 10*3/uL (ref 4.0–10.5)

## 2014-12-06 LAB — MAGNESIUM
Magnesium: 2.2 mg/dL (ref 1.7–2.4)
Magnesium: 2.1 mg/dL (ref 1.7–2.4)

## 2014-12-06 LAB — BASIC METABOLIC PANEL
ANION GAP: 7 (ref 5–15)
BUN: 9 mg/dL (ref 6–20)
CALCIUM: 8.2 mg/dL — AB (ref 8.9–10.3)
CO2: 25 mmol/L (ref 22–32)
CREATININE: 0.78 mg/dL (ref 0.61–1.24)
Chloride: 108 mmol/L (ref 101–111)
Glucose, Bld: 117 mg/dL — ABNORMAL HIGH (ref 65–99)
POTASSIUM: 4 mmol/L (ref 3.5–5.1)
Sodium: 140 mmol/L (ref 135–145)

## 2014-12-06 LAB — POCT I-STAT, CHEM 8
BUN: 11 mg/dL (ref 6–20)
CALCIUM ION: 1.19 mmol/L (ref 1.13–1.30)
Chloride: 101 mmol/L (ref 101–111)
Creatinine, Ser: 0.9 mg/dL (ref 0.61–1.24)
Glucose, Bld: 139 mg/dL — ABNORMAL HIGH (ref 65–99)
HCT: 28 % — ABNORMAL LOW (ref 39.0–52.0)
Hemoglobin: 9.5 g/dL — ABNORMAL LOW (ref 13.0–17.0)
Potassium: 3.7 mmol/L (ref 3.5–5.1)
Sodium: 140 mmol/L (ref 135–145)
TCO2: 19 mmol/L (ref 0–100)

## 2014-12-06 LAB — CREATININE, SERUM
Creatinine, Ser: 1.11 mg/dL (ref 0.61–1.24)
GFR calc non Af Amer: >60 mL/min (ref >60)

## 2014-12-06 MED ORDER — INSULIN DETEMIR 100 UNIT/ML ~~LOC~~ SOLN
35.0000 [IU] | Freq: Once | SUBCUTANEOUS | Status: AC
  Administered 2014-12-06: 35 [IU] via SUBCUTANEOUS
  Filled 2014-12-06: qty 0.35

## 2014-12-06 MED ORDER — INSULIN DETEMIR 100 UNIT/ML ~~LOC~~ SOLN
35.0000 [IU] | Freq: Every day | SUBCUTANEOUS | Status: DC
  Filled 2014-12-06: qty 0.35

## 2014-12-06 MED ORDER — POTASSIUM CHLORIDE 10 MEQ/50ML IV SOLN
INTRAVENOUS | Status: AC
  Filled 2014-12-06: qty 50

## 2014-12-06 MED ORDER — POTASSIUM CHLORIDE 10 MEQ/50ML IV SOLN
INTRAVENOUS | Status: AC
  Filled 2014-12-06: qty 150

## 2014-12-06 MED ORDER — INSULIN ASPART 100 UNIT/ML ~~LOC~~ SOLN: 3.0000 [IU] | Freq: Three times a day (TID) | SUBCUTANEOUS | Status: DC

## 2014-12-06 MED ORDER — ENOXAPARIN SODIUM 40 MG/0.4ML ~~LOC~~ SOLN
40.0000 mg | Freq: Every day | SUBCUTANEOUS | Status: DC
  Administered 2014-12-06 – 2014-12-13 (×8): 40 mg via SUBCUTANEOUS
  Filled 2014-12-06 (×9): qty 0.4

## 2014-12-06 MED ORDER — FUROSEMIDE 10 MG/ML IJ SOLN
40.0000 mg | Freq: Once | INTRAMUSCULAR | Status: AC
  Administered 2014-12-06: 40 mg via INTRAVENOUS
  Filled 2014-12-06: qty 4

## 2014-12-06 MED ORDER — INSULIN ASPART 100 UNIT/ML ~~LOC~~ SOLN
0.0000 [IU] | SUBCUTANEOUS | Status: DC
  Administered 2014-12-06 – 2014-12-07 (×4): 2 [IU] via SUBCUTANEOUS

## 2014-12-06 MED ORDER — POTASSIUM CHLORIDE 10 MEQ/50ML IV SOLN
10.0000 meq | INTRAVENOUS | Status: AC
  Administered 2014-12-06 (×3): 10 meq via INTRAVENOUS

## 2014-12-06 MED ORDER — POTASSIUM CHLORIDE 10 MEQ/50ML IV SOLN
10.0000 meq | INTRAVENOUS | Status: AC
  Administered 2014-12-06 (×2): 10 meq via INTRAVENOUS
  Filled 2014-12-06: qty 50

## 2014-12-06 NOTE — Progress Notes (Addendum)
1 Day Post-Op Procedure(s) (LRB): CORONARY ARTERY BYPASS GRAFTING (CABG), ON PUMP, TIMES FIVE, USING LEFT INTERNAL MAMMARY ARTERY, BILATERAL GREATER SAPHENOUS VEIN HARVESTED ENDOSCOPICALLY (N/A) TRANSESOPHAGEAL ECHOCARDIOGRAM (TEE) (N/A) Subjective: Some discomfort Denies nausea  Objective: Vital signs in last 24 hours: Temp:  [96.8 F (36 C)-100.9 F (38.3 C)] 99.5 F (37.5 C) (05/21 0700) Pulse Rate:  [78-91] 80 (05/21 0700) Cardiac Rhythm:  [-] Atrial paced (05/21 0740) Resp:  [8-26] 24 (05/21 0700) BP: (67-140)/(52-96) 113/67 mmHg (05/21 0700) SpO2:  [93 %-100 %] 97 % (05/21 0700) Arterial Line BP: (88-137)/(55-84) 105/58 mmHg (05/21 0700) FiO2 (%):  [40 %-50 %] 40 % (05/20 2240) Weight:  [268 lb 1.3 oz (121.6 kg)] 268 lb 1.3 oz (121.6 kg) (05/21 0600)  Hemodynamic parameters for last 24 hours: PAP: (21-45)/(7-29) 25/9 mmHg CO:  [3.9 L/min-6.3 L/min] 6.3 L/min CI:  [1.7 L/min/m2-2.7 L/min/m2] 2.7 L/min/m2  Intake/Output from previous day: 05/20 0701 - 05/21 0700 In: 7695.4 [I.V.:5280.4; Blood:485; NG/GT:30; IV Piggyback:1900] Out: 7505 [Urine:5390; Blood:1625; Chest Tube:490] Intake/Output this shift:    General appearance: alert, cooperative and no distress Neurologic: intact Heart: regular rate and rhythm Lungs: diminished breath sounds bibasilar Abdomen: mildly distended, + BS, nontender Has a loud pericardial friction rub  Lab Results:  Recent Labs  12/05/14 2015 12/06/14 0400  WBC 10.0 8.8  HGB 10.3* 10.1*  HCT 29.5* 29.6*  PLT 134* 121*   BMET:  Recent Labs  12/05/14 2001 12/05/14 2015 12/06/14 0400  NA 140  --  140  K 4.6  --  4.0  CL 107  --  108  CO2  --   --  25  GLUCOSE 130*  --  117*  BUN 10  --  9  CREATININE 0.80 0.85 0.78  CALCIUM  --   --  8.2*    PT/INR:  Recent Labs  12/05/14 1430  LABPROT 17.6*  INR 1.44   ABG    Component Value Date/Time   PHART 7.381 12/06/2014 0008   HCO3 24.3* 12/06/2014 0008   TCO2 25  12/06/2014 0008   ACIDBASEDEF 1.0 12/06/2014 0008   O2SAT 98.0 12/06/2014 0008   CBG (last 3)   Recent Labs  12/06/14 0006 12/06/14 0104 12/06/14 0157  GLUCAP 126* 124* 122*    Assessment/Plan: S/P Procedure(s) (LRB): CORONARY ARTERY BYPASS GRAFTING (CABG), ON PUMP, TIMES FIVE, USING LEFT INTERNAL MAMMARY ARTERY, BILATERAL GREATER SAPHENOUS VEIN HARVESTED ENDOSCOPICALLY (N/A) TRANSESOPHAGEAL ECHOCARDIOGRAM (TEE) (N/A) POD # 1  CV- good hemodynamics, dc swan  ASA, statin, beta blocker, restart ACE/ARB in a day or two  RESP- IS for basilar atelectasis  RENAL- creatinine and lytes ok, diurese  ENDO_ CBG well controlled with drip, transition to long acting + SSI  Anemia secondary to ABL- mild, follow  OOB and ambulate  DC CT  SCD + enoxaparin for DVT prophylaxis   LOS: 6 days    Loreli SlotSteven C Delesha Pohlman 12/06/2014

## 2014-12-06 NOTE — Progress Notes (Signed)
      [address]       [place] [id number]             [phone]      Resting comfortably  BP 110/55 mmHg  Pulse 72  Temp(Src) 98.2 F (36.8 C) (Oral)  Resp 17  Ht 6' (1.829 m)  Wt 268 lb 1.3 oz (121.6 kg)  BMI 36.35 kg/m2  SpO2 96%   Intake/Output Summary (Last 24 hours) at 12/06/14 1933 Last data filed at 12/06/14 1800  Gross per 24 hour  Intake 2104.6 ml  Output   3715 ml  Net -1610.4 ml    K= 3.7- being supplemented  Doing well POD # 1  Horton Ellithorpe C. Dorris FetchHendrickson, MD [facility] [phone]

## 2014-12-07 ENCOUNTER — Inpatient Hospital Stay (HOSPITAL_COMMUNITY): Payer: Medicare Other

## 2014-12-07 LAB — GLUCOSE, CAPILLARY
GLUCOSE-CAPILLARY: 112 mg/dL — AB (ref 65–99)
GLUCOSE-CAPILLARY: 121 mg/dL — AB (ref 65–99)
GLUCOSE-CAPILLARY: 134 mg/dL — AB (ref 65–99)
GLUCOSE-CAPILLARY: 151 mg/dL — AB (ref 65–99)
GLUCOSE-CAPILLARY: 162 mg/dL — AB (ref 65–99)
Glucose-Capillary: 128 mg/dL — ABNORMAL HIGH (ref 65–99)

## 2014-12-07 LAB — CBC
HEMATOCRIT: 28.4 % — AB (ref 39.0–52.0)
Hemoglobin: 9.6 g/dL — ABNORMAL LOW (ref 13.0–17.0)
MCH: 30.9 pg (ref 26.0–34.0)
MCHC: 33.8 g/dL (ref 30.0–36.0)
MCV: 91.3 fL (ref 78.0–100.0)
Platelets: 108 10*3/uL — ABNORMAL LOW (ref 150–400)
RBC: 3.11 MIL/uL — ABNORMAL LOW (ref 4.22–5.81)
RDW: 13.6 % (ref 11.5–15.5)
WBC: 10.7 10*3/uL — ABNORMAL HIGH (ref 4.0–10.5)

## 2014-12-07 LAB — BASIC METABOLIC PANEL
Anion gap: 8 (ref 5–15)
BUN: 13 mg/dL (ref 6–20)
CALCIUM: 8.6 mg/dL — AB (ref 8.9–10.3)
CHLORIDE: 103 mmol/L (ref 101–111)
CO2: 26 mmol/L (ref 22–32)
CREATININE: 0.88 mg/dL (ref 0.61–1.24)
GFR calc Af Amer: >60 mL/min (ref >60)
Glucose, Bld: 147 mg/dL — ABNORMAL HIGH (ref 65–99)
POTASSIUM: 3.9 mmol/L (ref 3.5–5.1)
Sodium: 137 mmol/L (ref 135–145)

## 2014-12-07 MED ORDER — AMIODARONE HCL IN DEXTROSE 360-4.14 MG/200ML-% IV SOLN
60.0000 mg/h | INTRAVENOUS | Status: AC
  Administered 2014-12-08 (×2): 60 mg/h via INTRAVENOUS
  Filled 2014-12-07 (×2): qty 200

## 2014-12-07 MED ORDER — FUROSEMIDE 40 MG PO TABS
40.0000 mg | ORAL_TABLET | Freq: Every day | ORAL | Status: DC
  Administered 2014-12-07 – 2014-12-14 (×8): 40 mg via ORAL
  Filled 2014-12-07 (×8): qty 1

## 2014-12-07 MED ORDER — INSULIN ASPART 100 UNIT/ML ~~LOC~~ SOLN
0.0000 [IU] | Freq: Three times a day (TID) | SUBCUTANEOUS | Status: DC
  Administered 2014-12-07: 3 [IU] via SUBCUTANEOUS
  Administered 2014-12-08: 2 [IU] via SUBCUTANEOUS
  Administered 2014-12-09 – 2014-12-10 (×2): 3 [IU] via SUBCUTANEOUS
  Administered 2014-12-12 – 2014-12-14 (×2): 2 [IU] via SUBCUTANEOUS

## 2014-12-07 MED ORDER — AMIODARONE LOAD VIA INFUSION
150.0000 mg | Freq: Once | INTRAVENOUS | Status: AC
  Administered 2014-12-08: 150 mg via INTRAVENOUS
  Filled 2014-12-07: qty 83.34

## 2014-12-07 MED ORDER — INSULIN DETEMIR 100 UNIT/ML ~~LOC~~ SOLN
25.0000 [IU] | Freq: Every day | SUBCUTANEOUS | Status: DC
  Administered 2014-12-07 – 2014-12-08 (×2): 25 [IU] via SUBCUTANEOUS
  Filled 2014-12-07 (×3): qty 0.25

## 2014-12-07 MED ORDER — AMIODARONE HCL IN DEXTROSE 360-4.14 MG/200ML-% IV SOLN
30.0000 mg/h | INTRAVENOUS | Status: DC
  Administered 2014-12-08 – 2014-12-09 (×3): 30 mg/h via INTRAVENOUS
  Filled 2014-12-07 (×7): qty 200

## 2014-12-07 MED ORDER — POTASSIUM CHLORIDE ER 10 MEQ PO TBCR
20.0000 meq | EXTENDED_RELEASE_TABLET | Freq: Every day | ORAL | Status: DC
  Administered 2014-12-07 – 2014-12-08 (×2): 20 meq via ORAL
  Filled 2014-12-07 (×3): qty 2

## 2014-12-07 MED ORDER — METFORMIN HCL 500 MG PO TABS
500.0000 mg | ORAL_TABLET | Freq: Every day | ORAL | Status: DC
  Administered 2014-12-07 – 2014-12-14 (×8): 500 mg via ORAL
  Filled 2014-12-07 (×10): qty 1

## 2014-12-07 MED ORDER — INSULIN ASPART 100 UNIT/ML ~~LOC~~ SOLN: 0.0000 [IU] | Freq: Every day | SUBCUTANEOUS | Status: DC

## 2014-12-07 NOTE — Progress Notes (Signed)
Pt complaining of not being able to urinate and pressure with foley catheter in place. Bladder scanned pt >250, proceeded to flush foley and make sure balloon was completely inflated. Upon checking the catheter balloon, pt began to bleed excessively from penis. On call MD Dorris Fetch(Hendrickson) notified, order given to replace foley. Foley is now draining properly, urine is bloody, will continue to flush and monitor foley.

## 2014-12-07 NOTE — Progress Notes (Signed)
2 Days Post-Op Procedure(s) (LRB): CORONARY ARTERY BYPASS GRAFTING (CABG), ON PUMP, TIMES FIVE, USING LEFT INTERNAL MAMMARY ARTERY, BILATERAL GREATER SAPHENOUS VEIN HARVESTED ENDOSCOPICALLY (N/A) TRANSESOPHAGEAL ECHOCARDIOGRAM (TEE) (N/A) Subjective: Difficulty with clots in bladder last night No complaints this AM  Objective: Vital signs in last 24 hours: Temp:  [98.2 F (36.8 C)-99.7 F (37.6 C)] 98.7 F (37.1 C) (05/22 0400) Pulse Rate:  [72-123] 80 (05/22 0700) Cardiac Rhythm:  [-] Atrial paced (05/21 2000) Resp:  [15-33] 25 (05/22 0700) BP: (94-149)/(55-131) 108/68 mmHg (05/22 0700) SpO2:  [92 %-100 %] 92 % (05/22 0700) Arterial Line BP: (115-143)/(59-70) 115/59 mmHg (05/21 1200) Weight:  [264 lb 15.9 oz (120.2 kg)] 264 lb 15.9 oz (120.2 kg) (05/22 0500)  Hemodynamic parameters for last 24 hours: PAP: (32-47)/(13-18) 47/18 mmHg  Intake/Output from previous day: 05/21 0701 - 05/22 0700 In: 1321 [P.O.:300; I.V.:771; IV Piggyback:250] Out: 2055 [Urine:2015; Chest Tube:40] Intake/Output this shift:    General appearance: alert and no distress Neurologic: intact Heart: regular rate and rhythm Lungs: diminished breath sounds bibasilar Abdomen: normal findings: soft, non-tender  Lab Results:  Recent Labs  12/06/14 1700 12/07/14 0400  WBC 9.9 10.7*  HGB 10.1* 9.6*  HCT 30.0* 28.4*  PLT 122* 108*   BMET:  Recent Labs  12/06/14 0400 12/06/14 1658 12/06/14 1700 12/07/14 0400  NA 140 140  --  137  K 4.0 3.7  --  3.9  CL 108 101  --  103  CO2 25  --   --  26  GLUCOSE 117* 139*  --  147*  BUN 9 11  --  13  CREATININE 0.78 0.90 1.11 0.88  CALCIUM 8.2*  --   --  8.6*    PT/INR:  Recent Labs  12/05/14 1430  LABPROT 17.6*  INR 1.44   ABG    Component Value Date/Time   PHART 7.381 12/06/2014 0008   HCO3 24.3* 12/06/2014 0008   TCO2 19 12/06/2014 1658   ACIDBASEDEF 1.0 12/06/2014 0008   O2SAT 98.0 12/06/2014 0008   CBG (last 3)   Recent Labs  12/06/14 1938 12/06/14 2347 12/07/14 0355  GLUCAP 159* 134* 151*    Assessment/Plan: S/P Procedure(s) (LRB): CORONARY ARTERY BYPASS GRAFTING (CABG), ON PUMP, TIMES FIVE, USING LEFT INTERNAL MAMMARY ARTERY, BILATERAL GREATER SAPHENOUS VEIN HARVESTED ENDOSCOPICALLY (N/A) TRANSESOPHAGEAL ECHOCARDIOGRAM (TEE) (N/A) Plan for transfer to step-down: see transfer orders   CV- stable  RESP- continue IS for mild bilateral atelectasis  RENAL_ creatinine OK, continue diuresis  Hematuria- likely secondary to Foley trauma, will give voiding trial today  ENDO- CBG well controlled  Change to AC/HS  Restart metformin, decrease levemir  SCD + enoxaparin for DVT prophylaxis  Ambulate   LOS: 7 days    Loreli SlotSteven C Daryana Whirley 12/07/2014

## 2014-12-07 NOTE — Progress Notes (Signed)
  Amiodarone Drug - Drug Interaction Consult Note  Recommendations: MONITOR K+ AND HR  Amiodarone is metabolized by the cytochrome P450 system and therefore has the potential to cause many drug interactions. Amiodarone has an average plasma half-life of 50 days (range 20 to 100 days).   There is potential for drug interactions to occur several weeks or months after stopping treatment and the onset of drug interactions may be slow after initiating amiodarone.   [x]  Statins (ATORVASTATIN): Increased risk of myopathy. Simvastatin- restrict dose to 20mg  daily. Other statins: counsel patients to report any muscle pain or weakness immediately.  []  Anticoagulants: Amiodarone can increase anticoagulant effect. Consider warfarin dose reduction. Patients should be monitored closely and the dose of anticoagulant altered accordingly, remembering that amiodarone levels take several weeks to stabilize.  []  Antiepileptics: Amiodarone can increase plasma concentration of phenytoin, the dose should be reduced. Note that small changes in phenytoin dose can result in large changes in levels. Monitor patient and counsel on signs of toxicity.  [x]  Beta blockers (LOPRESSOR): increased risk of bradycardia, AV block and myocardial depression. Sotalol - avoid concomitant use.  []   Calcium channel blockers (diltiazem and verapamil): increased risk of bradycardia, AV block and myocardial depression.  []   Cyclosporine: Amiodarone increases levels of cyclosporine. Reduced dose of cyclosporine is recommended.  []  Digoxin dose should be halved when amiodarone is started.  [x]  Diuretics (LASIX): increased risk of cardiotoxicity if hypokalemia occurs.  []  Oral hypoglycemic agents (glyburide, glipizide, glimepiride): increased risk of hypoglycemia. Patient's glucose levels should be monitored closely when initiating amiodarone therapy.   []  Drugs that prolong the QT interval:  Torsades de pointes risk may be increased  with concurrent use - avoid if possible.  Monitor QTc, also keep magnesium/potassium WNL if concurrent therapy can't be avoided. [name]. Antibiotics: e.g. fluoroquinolones, erythromycin. . Antiarrhythmics: e.g. quinidine, procainamide, disopyramide, sotalol. . Antipsychotics: e.g. phenothiazines, haloperidol.  . Lithium, tricyclic antidepressants, and methadone. Thank You,  Colleen CanBryk, Jazzlin Clements Pauline  12/07/2014 10:57 PM

## 2014-12-08 ENCOUNTER — Inpatient Hospital Stay (HOSPITAL_COMMUNITY): Payer: Medicare Other

## 2014-12-08 LAB — BASIC METABOLIC PANEL
Anion gap: 10 (ref 5–15)
BUN: 19 mg/dL (ref 6–20)
CO2: 24 mmol/L (ref 22–32)
Calcium: 8.3 mg/dL — ABNORMAL LOW (ref 8.9–10.3)
Chloride: 101 mmol/L (ref 101–111)
Creatinine, Ser: 0.81 mg/dL (ref 0.61–1.24)
GFR calc non Af Amer: >60 mL/min (ref >60)
GLUCOSE: 154 mg/dL — AB (ref 65–99)
POTASSIUM: 3.8 mmol/L (ref 3.5–5.1)
SODIUM: 135 mmol/L (ref 135–145)

## 2014-12-08 LAB — CBC
HCT: 28.7 % — ABNORMAL LOW (ref 39.0–52.0)
Hemoglobin: 9.6 g/dL — ABNORMAL LOW (ref 13.0–17.0)
MCH: 30.4 pg (ref 26.0–34.0)
MCHC: 33.4 g/dL (ref 30.0–36.0)
MCV: 90.8 fL (ref 78.0–100.0)
Platelets: 138 10*3/uL — ABNORMAL LOW (ref 150–400)
RBC: 3.16 MIL/uL — AB (ref 4.22–5.81)
RDW: 13.5 % (ref 11.5–15.5)
WBC: 9.4 10*3/uL (ref 4.0–10.5)

## 2014-12-08 LAB — GLUCOSE, CAPILLARY
GLUCOSE-CAPILLARY: 125 mg/dL — AB (ref 65–99)
Glucose-Capillary: 113 mg/dL — ABNORMAL HIGH (ref 65–99)
Glucose-Capillary: 134 mg/dL — ABNORMAL HIGH (ref 65–99)
Glucose-Capillary: 128 mg/dL — ABNORMAL HIGH (ref 65–99)

## 2014-12-08 MED ORDER — SODIUM CHLORIDE 0.9 % IJ SOLN: 3.0000 mL | INTRAMUSCULAR | Status: DC | PRN

## 2014-12-08 MED ORDER — SODIUM CHLORIDE 0.9 % IV SOLN: 250.0000 mL | INTRAVENOUS | Status: DC | PRN

## 2014-12-08 MED ORDER — MAGNESIUM HYDROXIDE 400 MG/5ML PO SUSP: 30.0000 mL | Freq: Every day | ORAL | Status: DC | PRN

## 2014-12-08 MED ORDER — MOVING RIGHT ALONG BOOK
Freq: Once | Status: AC
Start: 2014-12-08 — End: 2014-12-08
  Administered 2014-12-08: 07:00:00
  Filled 2014-12-08: qty 1

## 2014-12-08 MED ORDER — LISINOPRIL 10 MG PO TABS
10.0000 mg | ORAL_TABLET | Freq: Every day | ORAL | Status: DC
  Administered 2014-12-08 – 2014-12-12 (×5): 10 mg via ORAL
  Filled 2014-12-08 (×6): qty 1

## 2014-12-08 MED ORDER — GUAIFENESIN-DM 100-10 MG/5ML PO SYRP: 15.0000 mL | ORAL_SOLUTION | ORAL | Status: DC | PRN

## 2014-12-08 MED ORDER — ALUM & MAG HYDROXIDE-SIMETH 200-200-20 MG/5ML PO SUSP: 15.0000 mL | ORAL | Status: DC | PRN

## 2014-12-08 MED ORDER — SODIUM CHLORIDE 0.9 % IJ SOLN
3.0000 mL | Freq: Two times a day (BID) | INTRAMUSCULAR | Status: DC
  Administered 2014-12-08 – 2014-12-13 (×9): 3 mL via INTRAVENOUS

## 2014-12-08 MED ORDER — ZOLPIDEM TARTRATE 5 MG PO TABS: 5.0000 mg | ORAL_TABLET | Freq: Every evening | ORAL | Status: DC | PRN

## 2014-12-08 NOTE — Patient Care Conference (Signed)
Patient attempted to void but had some hematuria. Did with foley on unit.Could not viod and also converted from S/R to A-Fib with sl RVR. MD called , #16 F placed and started on Amiodarone gtt with promising effect.

## 2014-12-08 NOTE — Progress Notes (Signed)
Utilization review completed.  

## 2014-12-08 NOTE — Progress Notes (Addendum)
[address]       [place] [id number]             [phone]      3 Days Post-Op Procedure(s) (LRB): CORONARY ARTERY BYPASS GRAFTING (CABG), ON PUMP, TIMES FIVE, USING LEFT INTERNAL MAMMARY ARTERY, BILATERAL GREATER SAPHENOUS VEIN HARVESTED ENDOSCOPICALLY (N/A) TRANSESOPHAGEAL ECHOCARDIOGRAM (TEE) (N/A) Subjective: Afib with RVR , now on amio gtt  Objective: Vital signs in last 24 hours: Temp:  [97.9 F (36.6 C)-98.5 F (36.9 C)] 98.5 F (36.9 C) (05/23 0519) Pulse Rate:  [78-126] 102 (05/23 0519) Cardiac Rhythm:  [-] Sinus tachycardia;Normal sinus rhythm (05/22 2000) Resp:  [16-30] 16 (05/23 0519) BP: (108-149)/(64-99) 115/88 mmHg (05/23 0519) SpO2:  [94 %-100 %] 100 % (05/23 0519) Weight:  [266 lb 6.4 oz (120.838 kg)] 266 lb 6.4 oz (120.838 kg) (05/23 0519)  Hemodynamic parameters for last 24 hours:    Intake/Output from previous day: 05/22 0701 - 05/23 0700 In: 280 [P.O.:220; I.V.:60] Out: 1240 [Urine:1240] Intake/Output this shift:    General appearance: alert, cooperative and no distress Heart: irregularly irregular rhythm Lungs: clear to auscultation bilaterally Abdomen: benign Extremities: + edema Wound: evh incis ok, chest dressing CDI  Lab Results:  Recent Labs  12/06/14 1700 12/07/14 0400  WBC 9.9 10.7*  HGB 10.1* 9.6*  HCT 30.0* 28.4*  PLT 122* 108*   BMET:  Recent Labs  12/06/14 0400 12/06/14 1658 12/06/14 1700 12/07/14 0400  NA 140 140  --  137  K 4.0 3.7  --  3.9  CL 108 101  --  103  CO2 25  --   --  26  GLUCOSE 117* 139*  --  147*  BUN 9 11  --  13  CREATININE 0.78 0.90 1.11 0.88  CALCIUM 8.2*  --   --  8.6*    PT/INR:  Recent Labs  12/05/14 1430  LABPROT 17.6*  INR 1.44   ABG    Component Value Date/Time   PHART 7.381 12/06/2014 0008   HCO3 24.3* 12/06/2014 0008   TCO2 19 12/06/2014 1658   ACIDBASEDEF 1.0 12/06/2014 0008   O2SAT 98.0 12/06/2014 0008   CBG (last 3)   Recent Labs   12/07/14 1622 12/07/14 2141 12/08/14 0633  GLUCAP 112* 121* 113*    Meds Scheduled Meds: . acetaminophen  1,000 mg Oral 4 times per day   Or  . acetaminophen (TYLENOL) oral liquid 160 mg/5 mL  1,000 mg Per Tube 4 times per day  . aspirin EC  325 mg Oral Daily   Or  . aspirin  324 mg Per Tube Daily  . atorvastatin  40 mg Oral q1800  . bisacodyl  10 mg Oral Daily   Or  . bisacodyl  10 mg Rectal Daily  . docusate sodium  200 mg Oral Daily  . enoxaparin (LOVENOX) injection  40 mg Subcutaneous QHS  . furosemide  40 mg Oral Daily  . insulin aspart  0-15 Units Subcutaneous TID WC  . insulin aspart  0-5 Units Subcutaneous QHS  . insulin detemir  25 Units Subcutaneous Daily  . metFORMIN  500 mg Oral Q breakfast  . metoprolol tartrate  12.5 mg Oral BID   Or  . metoprolol tartrate  12.5 mg Per Tube BID  . pantoprazole  40 mg Oral Daily  . potassium chloride  20 mEq Oral Daily  . sodium chloride  3 mL Intravenous Q12H   Continuous Infusions: . amiodarone 30  mg/hr (12/08/14 1610)   PRN Meds:.sodium chloride, alum & mag hydroxide-simeth, guaiFENesin-dextromethorphan, magnesium hydroxide, ondansetron (ZOFRAN) IV, oxyCODONE, sodium chloride, traMADol, zolpidem  Xrays Dg Chest Port 1 View  12/07/2014   CLINICAL DATA:  Post CABG.  EXAM: PORTABLE CHEST - 1 VIEW  COMPARISON:  12/06/2014  FINDINGS: Swan-Ganz catheter and chest tubes have been removed. The right jugular central venous catheter is still present. Negative for a pneumothorax. Heart size remains enlarged. Low lung volumes. Again noted are streaky densities in the left upper lung and persistent densities at the left lung base.  IMPRESSION: Removal of support apparatuses as described. Negative for pneumothorax.  Persistent low lung volumes with patchy densities in left lung. Findings could represent atelectasis.   Electronically Signed   By: Richarda Overlie M.D.   On: 12/07/2014 08:44    Assessment/Plan: S/P Procedure(s) (LRB): CORONARY  ARTERY BYPASS GRAFTING (CABG), ON PUMP, TIMES FIVE, USING LEFT INTERNAL MAMMARY ARTERY, BILATERAL GREATER SAPHENOUS VEIN HARVESTED ENDOSCOPICALLY (N/A) TRANSESOPHAGEAL ECHOCARDIOGRAM (TEE) (N/A)   1 afib- cont amio load 2 no new labs 3 sugars well controlled 4 can start low dose ace inhibitor 5 cont diuresis 6 recheck labs in am  LOS: 8 days    GOLD,WAYNE E 12/08/2014  I have seen and examined the patient and agree with the assessment and plan as outlined.  Purcell Nails 12/08/2014 9:17 AM

## 2014-12-08 NOTE — Progress Notes (Signed)
Medicare Important Message given? YES  (If response is "NO", the following Medicare IM given date fields will be blank)  Date Medicare IM given: 12/08/14 Medicare IM given by:  Luismario Coston  

## 2014-12-08 NOTE — Progress Notes (Signed)
CARDIAC REHAB PHASE I   PRE:  Rate/Rhythm: 94 a.fib  BP:  Lying: 85/58 Sitting: 109/79       SaO2: 99 2L  MODE:  Ambulation: 200 ft   POST:  Rate/Rhythm: 119 a. fib  BP:  Sitting: 97/69        SaO2: 96 2L  Pt BP low, asymptomatic, mildly diaphoretic. Pt assisted to bathroom, did have small bowel movement, states he feels like he needs to go more but doesn't want to strain. RN notified. Pt ambulated 200 ft on 2L O2, assist x2, rolling walker, IV, foley, slow steady gait, tolerated fair. Standing rest x 2. Pt denies CP, dizziness, Clarke/o mild DOE. Pt states he had a "rough night" and hasn't felt well since. Pt to recliner after walk, feet elevated, call bell within reach. Pt had not eaten his lunch, offered food, pt declined all but ice cream. RN notified.  [id number]  [name], Jeremy Tadros C, RN, BSN 12/08/2014 2:01 PM

## 2014-12-08 NOTE — Evaluation (Signed)
Physical Therapy Evaluation Patient Details Name: [name] MRN: [id number] DOB: [birth year] Today's Date: 12/08/2014   History of Present Illness  Adm with NSTEMI, cardiac cath, and ultimately CABG (5/20).Post-op hematuria and afib PMHx-DM, Lt rotator cuff repair  Clinical Impression  Patient is s/p above surgery resulting in functional limitations due to the deficits listed below (see PT Problem List). Pt highly functional prior to surgery and anticipate he will make good progress. Patient will benefit from skilled PT to increase their independence and safety with mobility to allow discharge to the venue listed below.       Follow Up Recommendations Home health PT;Supervision for mobility/OOB    Equipment Recommendations  Rolling walker with 5" wheels    Recommendations for Other Services OT consult     Precautions / Restrictions Precautions Precautions: Sternal Precaution Comments: pt able to state no pulling; educated on other precaution      Mobility  Bed Mobility Overal bed mobility: Needs Assistance Bed Mobility: Sit to Sidelying         Sit to sidelying: Min assist General bed mobility comments: max cues for technique; assist to maintain sidelying while raising legs to bed (with assist)  Transfers Overall transfer level: Needs assistance Equipment used: Rolling walker (2 wheeled) Transfers: Sit to/from Stand Sit to Stand: Min assist         General transfer comment: from recliner and toilet; vc to maintain sternal precautions  Ambulation/Gait Ambulation/Gait assistance: Min guard Ambulation Distance (Feet): 15 Feet (toileted; 15) Assistive device: Rolling walker (2 wheeled) Gait Pattern/deviations: Step-through pattern;Decreased stride length;Trunk flexed     General Gait Details: pt upset he had been calling for help and no one had come; did not want to walk any further and returned to bed   Stairs            Wheelchair Mobility     Modified Rankin (Stroke Patients Only)       Balance Overall balance assessment: No apparent balance deficits (not formally assessed)                                           Pertinent Vitals/Pain HR 92-106 SaO2 on 2L 95-97%  Pain Assessment: Faces Faces Pain Scale: Hurts little more Pain Location: chest Pain Intervention(s): Limited activity within patient's tolerance;Monitored during session;Repositioned    Home Living Family/patient expects to be discharged to:: Private residence Living Arrangements: Spouse/significant other Available Help at Discharge: Family;Available 24 hours/day Type of Home: House Home Access: Stairs to enter Entrance Stairs-Rails: Right Entrance Stairs-Number of Steps: 3 Home Layout: One level Home Equipment: Cane - single point Additional Comments: regular height toilets    Prior Function Level of Independence: Independent               Hand Dominance        Extremity/Trunk Assessment   Upper Extremity Assessment: Overall WFL for tasks assessed           Lower Extremity Assessment: Overall WFL for tasks assessed      Cervical / Trunk Assessment: Normal  Communication   Communication: No difficulties  Cognition Arousal/Alertness: Awake/alert Behavior During Therapy: Flat affect Overall Cognitive Status: Within Functional Limits for tasks assessed                      General Comments      Exercises  Assessment/Plan    PT Assessment Patient needs continued PT services  PT Diagnosis Difficulty walking   PT Problem List Decreased activity tolerance;Decreased mobility;Decreased knowledge of use of DME;Decreased knowledge of precautions;Obesity;Pain  PT Treatment Interventions DME instruction;Gait training;Stair training;Functional mobility training;Therapeutic activities;Balance training;Patient/family education   PT Goals (Current goals can be found in the Care Plan section)  Acute Rehab PT Goals Patient Stated Goal: return home as independent as possible PT Goal Formulation: With patient Time For Goal Achievement: 12/15/14 Potential to Achieve Goals: Good    Frequency Min 3X/week   Barriers to discharge        Co-evaluation               End of Session Equipment Utilized During Treatment: Gait belt;Oxygen Activity Tolerance: Patient tolerated treatment well Patient left: in bed;with call bell/phone within reach           Time: [id number] PT Time Calculation (min) (ACUTE ONLY): 22 min   Charges:   PT Evaluation $Initial PT Evaluation Tier I: 1 Procedure     PT G Codes:        [name] 12/08/2014, 4:30 PM Pager [phone]

## 2014-12-09 ENCOUNTER — Encounter (HOSPITAL_COMMUNITY): Payer: Self-pay | Admitting: Thoracic Surgery (Cardiothoracic Vascular Surgery)

## 2014-12-09 LAB — CBC
HCT: 27.8 % — ABNORMAL LOW (ref 39.0–52.0)
HEMOGLOBIN: 9.4 g/dL — AB (ref 13.0–17.0)
MCH: 30.9 pg (ref 26.0–34.0)
MCHC: 33.8 g/dL (ref 30.0–36.0)
MCV: 91.4 fL (ref 78.0–100.0)
PLATELETS: 187 10*3/uL (ref 150–400)
RBC: 3.04 MIL/uL — AB (ref 4.22–5.81)
RDW: 13.5 % (ref 11.5–15.5)
WBC: 8.2 10*3/uL (ref 4.0–10.5)

## 2014-12-09 LAB — BASIC METABOLIC PANEL
ANION GAP: 10 (ref 5–15)
BUN: 21 mg/dL — ABNORMAL HIGH (ref 6–20)
CO2: 25 mmol/L (ref 22–32)
Calcium: 8.3 mg/dL — ABNORMAL LOW (ref 8.9–10.3)
Chloride: 100 mmol/L — ABNORMAL LOW (ref 101–111)
Creatinine, Ser: 0.79 mg/dL (ref 0.61–1.24)
GFR calc Af Amer: >60 mL/min (ref >60)
GFR calc non Af Amer: >60 mL/min (ref >60)
Glucose, Bld: 115 mg/dL — ABNORMAL HIGH (ref 65–99)
POTASSIUM: 3.6 mmol/L (ref 3.5–5.1)
Sodium: 135 mmol/L (ref 135–145)

## 2014-12-09 LAB — GLUCOSE, CAPILLARY
GLUCOSE-CAPILLARY: 171 mg/dL — AB (ref 65–99)
Glucose-Capillary: 97 mg/dL (ref 65–99)
Glucose-Capillary: 116 mg/dL — ABNORMAL HIGH (ref 65–99)

## 2014-12-09 MED ORDER — METOPROLOL TARTRATE 25 MG/10 ML ORAL SUSPENSION
12.5000 mg | Freq: Two times a day (BID) | ORAL | Status: DC
  Filled 2014-12-09 (×2): qty 5

## 2014-12-09 MED ORDER — WARFARIN - PHYSICIAN DOSING INPATIENT
Freq: Every day | Status: DC
  Administered 2014-12-09 – 2014-12-10 (×2)
  Administered 2014-12-11 – 2014-12-13 (×3): 1

## 2014-12-09 MED ORDER — AMIODARONE HCL 200 MG PO TABS
400.0000 mg | ORAL_TABLET | Freq: Two times a day (BID) | ORAL | Status: DC
  Administered 2014-12-09 – 2014-12-10 (×4): 400 mg via ORAL
  Filled 2014-12-09 (×6): qty 2

## 2014-12-09 MED ORDER — METOPROLOL TARTRATE 25 MG PO TABS
25.0000 mg | ORAL_TABLET | Freq: Two times a day (BID) | ORAL | Status: DC
  Administered 2014-12-09 (×2): 25 mg via ORAL
  Filled 2014-12-09 (×4): qty 1

## 2014-12-09 MED ORDER — ATORVASTATIN CALCIUM 80 MG PO TABS
80.0000 mg | ORAL_TABLET | Freq: Every day | ORAL | Status: DC
  Administered 2014-12-09 – 2014-12-13 (×5): 80 mg via ORAL
  Filled 2014-12-09 (×6): qty 1

## 2014-12-09 MED ORDER — POTASSIUM CHLORIDE ER 10 MEQ PO TBCR
40.0000 meq | EXTENDED_RELEASE_TABLET | Freq: Every day | ORAL | Status: DC
  Administered 2014-12-09 – 2014-12-14 (×5): 40 meq via ORAL
  Filled 2014-12-09 (×6): qty 4

## 2014-12-09 MED ORDER — POTASSIUM CHLORIDE CRYS ER 20 MEQ PO TBCR
40.0000 meq | EXTENDED_RELEASE_TABLET | Freq: Once | ORAL | Status: AC
  Administered 2014-12-09: 40 meq via ORAL
  Filled 2014-12-09: qty 2

## 2014-12-09 MED ORDER — WARFARIN SODIUM 2.5 MG PO TABS
2.5000 mg | ORAL_TABLET | Freq: Every day | ORAL | Status: DC
  Administered 2014-12-09 – 2014-12-10 (×2): 2.5 mg via ORAL
  Filled 2014-12-09 (×3): qty 1

## 2014-12-09 MED FILL — Sodium Bicarbonate IV Soln 8.4%: INTRAVENOUS | Qty: 50 | Status: AC

## 2014-12-09 MED FILL — Sodium Chloride IV Soln 0.9%: INTRAVENOUS | Qty: 2000 | Status: AC

## 2014-12-09 MED FILL — Heparin Sodium (Porcine) Inj 1000 Unit/ML: INTRAMUSCULAR | Qty: 30 | Status: AC

## 2014-12-09 MED FILL — Mannitol IV Soln 20%: INTRAVENOUS | Qty: 500 | Status: AC

## 2014-12-09 MED FILL — Heparin Sodium (Porcine) Inj 1000 Unit/ML: INTRAMUSCULAR | Qty: 10 | Status: AC

## 2014-12-09 MED FILL — Potassium Chloride Inj 2 mEq/ML: INTRAVENOUS | Qty: 40 | Status: AC

## 2014-12-09 MED FILL — Lidocaine HCl IV Inj 20 MG/ML: INTRAVENOUS | Qty: 5 | Status: AC

## 2014-12-09 MED FILL — Electrolyte-R (PH 7.4) Solution: INTRAVENOUS | Qty: 5000 | Status: AC

## 2014-12-09 MED FILL — Magnesium Sulfate Inj 50%: INTRAMUSCULAR | Qty: 10 | Status: AC

## 2014-12-09 NOTE — Progress Notes (Signed)
Physical Therapy Treatment Patient Details Name: HILBERTO BURZYNSKI MRN: [id number] DOB: 07-02-1941 Today's Date: 12/09/2014    History of Present Illness Adm with NSTEMI, cardiac cath, and ultimately CABG (5/20).Post-op hematuria and afib PMHx-DM, Lt rotator cuff repair    PT Comments    Pt progressing well towards physical therapy goals. Was able to perform mobility with increased independence this session, however continues to require cueing to maintain sternal precautions. Pt on RA throughout session with sats stable >95% at all times. Per RN was left on RA at end of session. Noted occasional increase in HR up to the 140's during gait training, which was able to be lowered with standing rest and breathing techniques. Will need to practice stairs next session to prepare for d/c.    Follow Up Recommendations  Home health PT;Supervision for mobility/OOB     Equipment Recommendations  Rolling walker with 5" wheels    Recommendations for Other Services OT consult     Precautions / Restrictions Precautions Precautions: Fall;Sternal Precaution Comments: Pt states he knows his sternal precautions however not able to recall any of them from memory. Sternal precautions reinforced.  Restrictions Weight Bearing Restrictions: Yes (Sternal)    Mobility  Bed Mobility Overal bed mobility: Needs Assistance Bed Mobility: Rolling;Sidelying to Sit Rolling: Supervision Sidelying to sit: Min assist       General bed mobility comments: VC's to maintain sternal precautions. Pt able to maneuver himself to his side, however required assist to elevate trunk to full sitting position without UE use.   Transfers Overall transfer level: Needs assistance Equipment used: Rolling walker (2 wheeled) Transfers: Sit to/from Stand Sit to Stand: Min guard         General transfer comment: Pt was able to power-up to full standing while holding heart pillow. Pt was cued for technique, to scoot to very  EOB and perform a large anterior lean to be able to get weight over feet to stand.   Ambulation/Gait Ambulation/Gait assistance: Supervision Ambulation Distance (Feet): 250 Feet Assistive device: Rolling walker (2 wheeled) Gait Pattern/deviations: Step-through pattern;Decreased stride length;Trunk flexed Gait velocity: Decreased Gait velocity interpretation: Below normal speed for age/gender General Gait Details: Pt on RA throughout gait training. Sats remained >95%. HR occasionally jumped into the 140's however was able to be lowered with standing rest and breathing techniques. No LOB noted.    Stairs            Wheelchair Mobility    Modified Rankin (Stroke Patients Only)       Balance Overall balance assessment: No apparent balance deficits (not formally assessed)                                  Cognition Arousal/Alertness: Awake/alert Behavior During Therapy: Flat affect Overall Cognitive Status: Within Functional Limits for tasks assessed                      Exercises      General Comments        Pertinent Vitals/Pain Pain Assessment: No/denies pain    Home Living                      Prior Function            PT Goals (current goals can now be found in the care plan section) Acute Rehab PT Goals Patient Stated Goal: return home as  independent as possible PT Goal Formulation: With patient Time For Goal Achievement: 12/15/14 Potential to Achieve Goals: Good Progress towards PT goals: Progressing toward goals    Frequency  Min 3X/week    PT Plan Current plan remains appropriate    Co-evaluation             End of Session Equipment Utilized During Treatment: Gait belt Activity Tolerance: Patient tolerated treatment well Patient left: in bed;with call bell/phone within reach     Time: 0745-0818 PT Time Calculation (min) (ACUTE ONLY): 33 min  Charges:  $Gait Training: 8-22 mins $Therapeutic Activity:  8-22 mins                    G Codes:      Conni SlipperKirkman, Gerry Blanchfield 12/09/2014, 8:25 AM  Conni SlipperLaura Hussain Maimone, PT, DPT Acute Rehabilitation Services Pager: [phone]

## 2014-12-09 NOTE — Progress Notes (Signed)
CARDIAC REHAB PHASE I   PRE:  Rate/Rhythm: 106 afib  BP:  Supine: 110/70  Sitting:   Standing:    SaO2: 96%RA  MODE:  Ambulation: 350 ft   POST:  Rate/Rhythm: 120  BP:  Supine: 130/80  Sitting:   Standing:    SaO2: 99%RA 1321-1400 Assisted to bathroom and changed pt's bed as right leg draining some. Pt walked 350 ft with rolling walker on RA with minimal asst and tolerated well. Stopped once to rest. Did well on RA. Back to bed after walk.    Luetta Nuttingharlene Sidonie Dexheimer, RN BSN  12/09/2014 1:53 PM

## 2014-12-09 NOTE — Consult Note (Signed)
   [facility]   12/09/2014  [name] [facility] August 31, 1940 [id number] Patient evaluated for community based chronic disease management services with [facility] as a benefit of patient's [facility] Medicare Insurance. Spoke with patient at bedside to explain [facility].  Patient will receive post discharge transition of care call and will be evaluated for monthly home visits for assessments and disease process education.  Left contact information and [facility] literature at bedside.  Consent form signed. Made Inpatient Case Manager aware that [facility] following. Of note, [facility] services does not replace or interfere with any services that are arranged by inpatient case management or social work.  For additional questions or referrals please contact:   Charlesetta ShanksVictoria Kortnie Stovall, RN BSN CCM [facility] Liaison  [phone] business mobile phone

## 2014-12-09 NOTE — Progress Notes (Signed)
    Subjective:  Denies dyspnea; chest "sore"   Objective:  Filed Vitals:   12/08/14 0519 12/08/14 1556 12/08/14 2057 12/09/14 0500  BP: 115/88 113/80 123/58 131/82  Pulse: 102 90 99 99  Temp: 98.5 F (36.9 C) 97.6 F (36.4 C) 98.2 F (36.8 C) 97.3 F (36.3 C)  TempSrc: Oral Oral Oral Oral  Resp: 16 18 20 18   Height:      Weight: 266 lb 6.4 oz (120.838 kg)     SpO2: 100% 100% 98% 99%    Intake/Output from previous day:  Intake/Output Summary (Last 24 hours) at 12/09/14 0853 Last data filed at 12/09/14 0646  Gross per 24 hour  Intake    240 ml  Output   2100 ml  Net  -1860 ml    Physical Exam: Physical exam: Well-developed obese in no acute distress.  Skin is warm and dry.  HEENT is normal.  Neck is supple.  Chest is clear to auscultation with normal expansion.  Cardiovascular exam is irregular Abdominal exam nontender or distended. No masses palpated. Extremities show 1+ edema. neuro grossly intact    Lab Results: Basic Metabolic Panel:  Recent Labs  16/04/9604/21/16 1700  12/08/14 0853 12/09/14 0410  NA  --   < > 135 135  K  --   < > 3.8 3.6  CL  --   < > 101 100*  CO2  --   < > 24 25  GLUCOSE  --   < > 154* 115*  BUN  --   < > 19 21*  CREATININE 1.11  < > 0.81 0.79  CALCIUM  --   < > 8.3* 8.3*  MG 2.1  --   --   --   < > = values in this interval not displayed. CBC:  Recent Labs  12/08/14 0853 12/09/14 0410  WBC 9.4 8.2  HGB 9.6* 9.4*  HCT 28.7* 27.8*  MCV 90.8 91.4  PLT 138* 187     Assessment/Plan:  1 s/p CABG-continue ASA; increase lipitor to 80 mg po daily 2 post operative atrial fibrillation-patient remains in atrial fibrillation; rate high normal; increase metroprolol to 25 mg po BID; continue amiodarone; if atrial fibrillation persists, will need short term coumadin. Anticipate DCing amiodarone in approximately 8 weeks if sinus reestablished. 3 post operative volume excess-continue lasix; remains volume overloaded on exam 4  hypertension-continue lopressor and lisinopril  Jeremy MillersBrian Asiyah Clarke 12/09/2014, 8:53 AM   '

## 2014-12-09 NOTE — Progress Notes (Addendum)
[address]       [employer] [place] [address]             [phone]      4 Days Post-Op Procedure(s) (LRB): CORONARY ARTERY BYPASS GRAFTING (CABG), ON PUMP, TIMES FIVE, USING LEFT INTERNAL MAMMARY ARTERY, BILATERAL GREATER SAPHENOUS VEIN HARVESTED ENDOSCOPICALLY (N/A) TRANSESOPHAGEAL ECHOCARDIOGRAM (TEE) (N/A) Subjective: Feels ok, afib rate is improved Urine cleared  Objective: Vital signs in last 24 hours: Temp:  [97.3 F (36.3 C)-98.2 F (36.8 C)] 97.3 F (36.3 C) (05/24 0500) Pulse Rate:  [90-99] 99 (05/24 0500) Cardiac Rhythm:  [-] Atrial fibrillation (05/23 2139) Resp:  [18-20] 18 (05/24 0500) BP: (113-131)/(58-82) 131/82 mmHg (05/24 0500) SpO2:  [98 %-100 %] 99 % (05/24 0500)  Hemodynamic parameters for last 24 hours:    Intake/Output from previous day: 05/23 0701 - 05/24 0700 In: 476 [P.O.:476] Out: 2100 [Urine:2100] Intake/Output this shift:    General appearance: alert, cooperative and no distress Heart: irregularly irregular rhythm Lungs: mildly dim in bases Abdomen: benign Extremities: + LE edema Wound: incis healing well  Lab Results:  Recent Labs  12/08/14 0853 12/09/14 0410  WBC 9.4 8.2  HGB 9.6* 9.4*  HCT 28.7* 27.8*  PLT 138* 187   BMET:  Recent Labs  12/08/14 0853 12/09/14 0410  NA 135 135  K 3.8 3.6  CL 101 100*  CO2 24 25  GLUCOSE 154* 115*  BUN 19 21*  CREATININE 0.81 0.79  CALCIUM 8.3* 8.3*    PT/INR: No results for input(s): LABPROT, INR in the last 72 hours. ABG    Component Value Date/Time   PHART 7.381 12/06/2014 0008   HCO3 24.3* 12/06/2014 0008   TCO2 19 12/06/2014 1658   ACIDBASEDEF 1.0 12/06/2014 0008   O2SAT 98.0 12/06/2014 0008   CBG (last 3)   Recent Labs  12/08/14 1150 12/08/14 1634 12/08/14 2129  GLUCAP 134* 128* 125*    Meds Scheduled Meds: . acetaminophen  1,000 mg Oral 4 times per day   Or  . acetaminophen (TYLENOL) oral liquid 160 mg/5 mL  1,000 mg Per Tube 4 times per  day  . aspirin EC  325 mg Oral Daily   Or  . aspirin  324 mg Per Tube Daily  . atorvastatin  40 mg Oral q1800  . bisacodyl  10 mg Oral Daily   Or  . bisacodyl  10 mg Rectal Daily  . docusate sodium  200 mg Oral Daily  . enoxaparin (LOVENOX) injection  40 mg Subcutaneous QHS  . furosemide  40 mg Oral Daily  . insulin aspart  0-15 Units Subcutaneous TID WC  . insulin aspart  0-5 Units Subcutaneous QHS  . insulin detemir  25 Units Subcutaneous Daily  . lisinopril  10 mg Oral Daily  . metFORMIN  500 mg Oral Q breakfast  . metoprolol tartrate  12.5 mg Oral BID   Or  . metoprolol tartrate  12.5 mg Per Tube BID  . pantoprazole  40 mg Oral Daily  . potassium chloride  20 mEq Oral Daily  . sodium chloride  3 mL Intravenous Q12H   Continuous Infusions: . amiodarone 30 mg/hr (12/09/14 0003)   PRN Meds:.sodium chloride, alum & mag hydroxide-simeth, guaiFENesin-dextromethorphan, magnesium hydroxide, ondansetron (ZOFRAN) IV, oxyCODONE, sodium chloride, traMADol, zolpidem  Xrays Dg Chest 2 View  12/08/2014   CLINICAL DATA:  CABG.  EXAM: CHEST  2 VIEW  COMPARISON:  12/07/2014.  FINDINGS: Prior CABG. Cardiomegaly. No pulmonary  venous congestion . No pulmonary venous congestion. Bibasilar subsegmental atelectasis again noted. No pleural effusion pneumothorax.  IMPRESSION: 1. Prior CABG.  Cardiomegaly.  No pulmonary venous congestion. 2. Bibasilar subsegmental atelectasis.   Electronically Signed   By: Maisie Fushomas  Register   On: 12/08/2014 07:51    Assessment/Plan: S/P Procedure(s) (LRB): CORONARY ARTERY BYPASS GRAFTING (CABG), ON PUMP, TIMES FIVE, USING LEFT INTERNAL MAMMARY ARTERY, BILATERAL GREATER SAPHENOUS VEIN HARVESTED ENDOSCOPICALLY (N/A) TRANSESOPHAGEAL ECHOCARDIOGRAM (TEE) (N/A)  1 doing better, will convert amio to po, increase beta blocker dose 2 Cont diuresis,replace K+ 3 d/c foley today 4 sugars ok, will d/c insulin , may need to increase metformin 5 push rehab/pulm toilet as  able     LOS: 9 days    GOLD,WAYNE E 12/09/2014  I have seen and examined the patient and agree with the assessment and plan as outlined.  Will start coumadin for post-op persistent atrial fibrillation.  Continue metoprolol and amiodarone.  Purcell NailsClarence H Owen 12/09/2014 4:35 PM

## 2014-12-10 LAB — GLUCOSE, CAPILLARY
GLUCOSE-CAPILLARY: 106 mg/dL — AB (ref 65–99)
GLUCOSE-CAPILLARY: 127 mg/dL — AB (ref 65–99)
Glucose-Capillary: 151 mg/dL — ABNORMAL HIGH (ref 65–99)
Glucose-Capillary: 112 mg/dL — ABNORMAL HIGH (ref 65–99)

## 2014-12-10 LAB — PROTIME-INR
INR: 1.27 (ref 0.00–1.49)
PROTHROMBIN TIME: 16 s — AB (ref 11.6–15.2)

## 2014-12-10 MED ORDER — COUMADIN BOOK
Freq: Once | Status: AC
Start: 2014-12-10 — End: 2014-12-10
  Administered 2014-12-10: 12:00:00
  Filled 2014-12-10: qty 1

## 2014-12-10 MED ORDER — OFF THE BEAT BOOK
Freq: Once | Status: AC
  Administered 2014-12-10: 12:00:00
  Filled 2014-12-10: qty 1

## 2014-12-10 MED ORDER — METOPROLOL TARTRATE 50 MG PO TABS
50.0000 mg | ORAL_TABLET | Freq: Two times a day (BID) | ORAL | Status: DC
  Administered 2014-12-10 – 2014-12-11 (×4): 50 mg via ORAL
  Filled 2014-12-10 (×6): qty 1

## 2014-12-10 MED ORDER — MAGNESIUM HYDROXIDE 400 MG/5ML PO SUSP: 30.0000 mL | Freq: Every day | ORAL | Status: DC | PRN

## 2014-12-10 MED ORDER — LACTULOSE 10 GM/15ML PO SOLN
20.0000 g | Freq: Every day | ORAL | Status: DC | PRN
  Filled 2014-12-10: qty 30

## 2014-12-10 MED ORDER — WARFARIN VIDEO: Freq: Once | Status: DC

## 2014-12-10 NOTE — Progress Notes (Signed)
Pt ambulated 350 feet with NT; steady gait noted; no walker needed; pt back to room to BR; will cont. To monitor.

## 2014-12-10 NOTE — Progress Notes (Signed)
CARDIAC REHAB PHASE I   PRE:  Rate/Rhythm: 87 A fib  BP:  Supine:   Sitting: 122/72  Standing:    SaO2: 97% RA  MODE:  Ambulation: 550 ft   POST:  Rate/Rhythm: 138 A fib  BP:  Supine:   Sitting: 130/78  Standing:    SaO2: 95% RA  1020-1124   pt. Walked 56850ft with min. assistance. Patient did stopped and rest 3 times for about 1-2 minutes.Pt SaO2 remained over 90%. Pt. returned to recliner with LE elevation and VSS. Did education and pt. was receptive. Practiced IS and pt. Went to 1200 mL. Discussed CRPII and sent referral to [facility]. Asked pt. RN to ask pharmacist to discuss coumadin. Pt. wife was interested in home health, case manager notified. Encouraged to watch d/c video before going home.  [name] D Elizah Lydon,MS,ACSM-RCEP 12/10/2014 11:16 AM

## 2014-12-10 NOTE — Progress Notes (Signed)
Pt given Off the Beat booklet and Coumadin fact sheet; pt to receive Coumadin booklet today and watch Coumadin video; pt with visitors in room at this time; will have pt watch video this afternoon per pt request; will cont. To monitor.

## 2014-12-10 NOTE — Progress Notes (Addendum)
       [address]       [place] [id number]             [phone]          5 Days Post-Op Procedure(s) (LRB): CORONARY ARTERY BYPASS GRAFTING (CABG), ON PUMP, TIMES FIVE, USING LEFT INTERNAL MAMMARY ARTERY, BILATERAL GREATER SAPHENOUS VEIN HARVESTED ENDOSCOPICALLY (N/A) TRANSESOPHAGEAL ECHOCARDIOGRAM (TEE) (N/A)  Subjective: Comfortable, no complaints. No BM yet, but passing flatus.   Objective: Vital signs in last 24 hours: Patient Vitals for the past 24 hrs:  BP Temp Temp src Pulse Resp SpO2 Weight  12/10/14 0356 126/80 mmHg 98.3 F (36.8 C) Oral (!) 106 17 99 % 258 lb 3.2 oz (117.119 kg)  12/09/14 2003 136/80 mmHg 99 F (37.2 C) Oral 81 19 98 % -  12/09/14 1844 116/74 mmHg 98.4 F (36.9 C) Oral 68 18 98 % -   Current Weight  12/10/14 258 lb 3.2 oz (117.119 kg)  BASELINE WEIGHT:115 kg   Intake/Output from previous day: 05/24 0701 - 05/25 0700 In: 1080 [P.O.:1080] Out: -   CBGs 116-106   PHYSICAL EXAM:  Heart: Irr irr, rate 100-110 Lungs: Clear Wound: R leg incision with bloody drainage, remaining incisions clean and dry Extremities:  +LE edema    Lab Results: CBC: Recent Labs  12/08/14 0853 12/09/14 0410  WBC 9.4 8.2  HGB 9.6* 9.4*  HCT 28.7* 27.8*  PLT 138* 187   BMET:  Recent Labs  12/08/14 0853 12/09/14 0410  NA 135 135  K 3.8 3.6  CL 101 100*  CO2 24 25  GLUCOSE 154* 115*  BUN 19 21*  CREATININE 0.81 0.79  CALCIUM 8.3* 8.3*    PT/INR:  Recent Labs  12/10/14 0445  LABPROT 16.0*  INR 1.27      Assessment/Plan: S/P Procedure(s) (LRB): CORONARY ARTERY BYPASS GRAFTING (CABG), ON PUMP, TIMES FIVE, USING LEFT INTERNAL MAMMARY ARTERY, BILATERAL GREATER SAPHENOUS VEIN HARVESTED ENDOSCOPICALLY (N/A) TRANSESOPHAGEAL ECHOCARDIOGRAM (TEE) (N/A)  CV- remains in AF, rate elevated at times. Continue po Amio, Lopressor-  Will increase in dose and watch since BPs stable, continue low dose anticoagulation.    Vol  overload- continue diuresis.  DM- sugars stable on po meds.  CRPI, pulm toilet.  LOC today.   LOS: 10 days    [name],[name] 12/10/2014  I have seen and examined the patient and agree with the assessment and plan as outlined.  Purcell NailsClarence Clarke [name] 12/10/2014 8:58 AM

## 2014-12-11 DIAGNOSIS — I4891 Unspecified atrial fibrillation: Secondary | ICD-10-CM | POA: Diagnosis not present

## 2014-12-11 LAB — COMPREHENSIVE METABOLIC PANEL
ALK PHOS: 106 U/L (ref 38–126)
ALT: 52 U/L (ref 17–63)
AST: 63 U/L — ABNORMAL HIGH (ref 15–41)
Albumin: 2.9 g/dL — ABNORMAL LOW (ref 3.5–5.0)
Anion gap: 9 (ref 5–15)
BUN: 21 mg/dL — ABNORMAL HIGH (ref 6–20)
CO2: 26 mmol/L (ref 22–32)
CREATININE: 1.05 mg/dL (ref 0.61–1.24)
Calcium: 9 mg/dL (ref 8.9–10.3)
Chloride: 101 mmol/L (ref 101–111)
GFR calc Af Amer: >60 mL/min (ref >60)
GFR calc non Af Amer: >60 mL/min (ref >60)
Glucose, Bld: 147 mg/dL — ABNORMAL HIGH (ref 65–99)
Potassium: 3.9 mmol/L (ref 3.5–5.1)
Sodium: 136 mmol/L (ref 135–145)
Total Bilirubin: 1.8 mg/dL — ABNORMAL HIGH (ref 0.3–1.2)
Total Protein: 6.1 g/dL — ABNORMAL LOW (ref 6.5–8.1)

## 2014-12-11 LAB — URINALYSIS, ROUTINE W REFLEX MICROSCOPIC
Bilirubin Urine: NEGATIVE
GLUCOSE, UA: 100 mg/dL — AB
KETONES UR: NEGATIVE mg/dL
NITRITE: NEGATIVE
SPECIFIC GRAVITY, URINE: 1.025 (ref 1.005–1.030)
Urobilinogen, UA: 4 mg/dL — ABNORMAL HIGH (ref 0.0–1.0)
pH: 7 (ref 5.0–8.0)

## 2014-12-11 LAB — GLUCOSE, CAPILLARY
GLUCOSE-CAPILLARY: 128 mg/dL — AB (ref 65–99)
GLUCOSE-CAPILLARY: 167 mg/dL — AB (ref 65–99)
Glucose-Capillary: 113 mg/dL — ABNORMAL HIGH (ref 65–99)
Glucose-Capillary: 127 mg/dL — ABNORMAL HIGH (ref 65–99)

## 2014-12-11 LAB — URINE MICROSCOPIC-ADD ON

## 2014-12-11 LAB — PROTIME-INR
INR: 1.18 (ref 0.00–1.49)
Prothrombin Time: 15.2 seconds (ref 11.6–15.2)

## 2014-12-11 MED ORDER — AMIODARONE HCL 200 MG PO TABS
200.0000 mg | ORAL_TABLET | Freq: Two times a day (BID) | ORAL | Status: DC
  Administered 2014-12-11 (×2): 200 mg via ORAL
  Filled 2014-12-11 (×4): qty 1

## 2014-12-11 MED ORDER — WARFARIN SODIUM 5 MG PO TABS
5.0000 mg | ORAL_TABLET | Freq: Every day | ORAL | Status: DC
  Administered 2014-12-11 – 2014-12-13 (×3): 5 mg via ORAL
  Filled 2014-12-11 (×4): qty 1

## 2014-12-11 NOTE — Progress Notes (Signed)
Epicardial pacing wires removed per protocol without problems. Pt tolerated well. VSS. Pt on bedrest for 1 hour, VS monitoring. Pt educated. Call in reach.

## 2014-12-11 NOTE — Discharge Summary (Signed)
Physician Discharge Summary  Patient ID: [name] MRN: [id number] DOB/AGE: [birth year] 75 y.o.  Admit date: 11/30/2014 Discharge date: 12/14/2014  Admission Diagnoses: Severe three-vessel coronary artery disease status post acute non-STEMI  Discharge Diagnoses:  Principal Problem:   S/P CABG x 5 Active Problems:   NSTEMI (non-ST elevated myocardial infarction)   Atrial fibrillation  Patient Active Problem List   Diagnosis Date Noted  . Atrial fibrillation 12/11/2014  . 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 NSTEMI (non-ST elevated myocardial infarction) 11/30/2014  . Nephrolithiasis    History of present illness:  The patient is a 74 year old gentleman who presented to the emergency department with chest pain. EKG showed nonspecific ST abnormalities and inferior infarct. Initial troponins were slightly elevated but did rise and he was admitted with findings consistent with NSTEMI. He was seen in cardiology consultation and admitted for further evaluation and treatment to include diagnostic cardiac catheterization.  Discharged Condition: good  Hospital Course: The patient was admitted to the cardiology service and placed on a heparin drip. He did rule in for non-STEMI. Cardiac catheterization was performed and revealed the following:    CARDIAC CATHETERIZATION:  FINAL CONCLUSION:  Severe diffuse three-vessel coronary artery disease with severe calcific stenosis of the LAD, severe stenosis of the second OM branch, and total occlusion of a large, dominant RCA with the presence of left-to-right collaterals  Mild segmental LV contraction abnormality with preserved overall LVEF  RECOMMENDATIONS:  Cardiac surgery consultation for consideration of CABG  Will transfer patient to A 2 Heart stepdown bed, resume IV heparin, and continue IV NTG.    Coronary Findings    Dominance: Right   Left Main   . LM lesion, 25% stenosed. calcified . Mild distal left main stenosis  with associated calcification     Left Anterior Descending   . Ost LAD lesion, 50% stenosed. calcified .   [name] Prox LAD to Mid LAD lesion, 99% stenosed. calcified, diffuse .   [name] Mid LAD lesion, 80% stenosed. calcified .   [name] First Product manager   . Ost 1st Diag to 1st Diag lesion, 75% stenosed. calcified .     Left Circumflex   . Prox Cx lesion, 30% stenosed. calcified .   [name] First Obtuse Marginal Branch   . 1st Mrg lesion, 40% stenosed.   . Second Obtuse Marginal Branch   The vessel is angiographically normal.   . 2nd Mrg lesion, 99% stenosed.     Right Coronary Artery  Dist RCA filled by collaterals from 1st Sept.   . Mid RCA lesion, 100% stenosed. calcified .   [name] Inferior Septal   Inf Sept filled by collaterals from 2nd Sept.   . First Right Posterolateral   1st RPLB filled by collaterals from 1st Sept.         Due to these findings cardiothoracic surgical consultation was obtained with Tressie Stalker M.D. who evaluated the patient and studies and agree with recommendations to proceed with coronary artery surgical revascularization. The procedure was scheduled and on 01/05/2015 was taken the operating room and underwent the following procedure:  CARDIOTHORACIC SURGERY OPERATIVE NOTE  Date of Procedure:12/05/2014  Preoperative Diagnosis:  Severe 3-vessel Coronary Artery Disease  S/P Acute non-STEMI  Postoperative Diagnosis:Same  Procedure:   Coronary Artery Bypass Grafting x 5 Left Internal Mammary Artery to Distal Left Anterior Descending Coronary Artery Saphenous Vein Graft to Posterior Descending Coronary Artery Saphenous Vein Graft to First Obtuse Marginal Branch of Left Circumflex Coronary Artery Sequential Saphenous  Vein Graft to Second Obtuse Marginal Branch of Left Circumflex Coronary Artery Sapheonous Vein Graft to  Diagonal Branch Coronary Artery Endoscopic Vein Harvest from Bilateral Thighs and Right Lower Leg  Surgeon:Clarence H. Cornelius Moras, MD  Assistant:Wayne Clifton Custard, PA-C  Anesthesia:James D. Krista Blue, MD  Operative Findings:  Normal LV systolic function  Good quality LIMA conduit for grafting  Fair to good quality SVG conduit for grafting  Diffuse CAD but good quality target vessels for grafting   The patient tolerated the procedure well and is transported to the surgical intensive care in stable condition. There are no intraoperative complications. All sponge instrument and needle counts are verified correct at completion of the operation.   Post operative hospital course:  The patient has overall progressed nicely. He was weaned from the ventilator using standard protocols. He maintained stable hemodynamics in the intensive care unit. He does have a moderate postoperative volume overload which is responding slowly to diuretics . He has an expected acute blood loss anemia which is stable. He has had postoperative atrial fibrillation which is chemically cardioverted to sinus rhythm with amiodarone and beta blocker. He has been started on Coumadin anticoagulation therapy. All routine lines, monitors and drainage devices have been discontinued in standard fashion. Incisions are healing well without evidence of infection. He is tolerating gradually increasing activity using standard protocols. Oxygen has been weaned and he maintains good saturations on room air. He has been started on an ACE inhibitor and blood pressure is tolerating this. Blood sugars are under good control. He is on his preoperative dose of Glucophage. Currently he is felt to be quite stable for tentative discharge in the next 24-48 hours pending ongoing reevaluation of his overall recovery.  Consults: cardiology  Significant Diagnostic Studies: angiography: cardiac catheterization  Treatments: surgery:  as above  Discharge Exam: Blood pressure 142/88, pulse 88, temperature 98.6 F (37 C), temperature source Oral, resp. rate 12, height 6' (1.829 m), weight 246 lb 1.6 oz (111.63 kg), SpO2 100 %. Cardiovascular: RRR Pulmonary: Clear to auscultation bilaterally; no rales, wheezes, or rhonchi. Abdomen: Soft, non tender, bowel sounds present. Extremities: Mild bilateral lower extremity edema. Wounds: Sternal wound is clean and dry. No erythema or signs of infection. Skin edges of right EVH wound slightly separated so there is minor sero sanguinous ooze. No signs of infection.   Disposition: 01-Home or Self Care      Discharge Instructions    AMB Referral to [facility] Care Management    Complete by:  As directed   Reason for consult:  Post hospital follow up  Expected date of contact:  1-3 days (reserved for hospital discharges)  Please assign to community nurse for transition of care calls and assess for home visits. Questions please call: Charlesetta Shanks, RN BSN CCM [facility]  [phone] business mobile phone     Amb Referral to Cardiac Rehabilitation    Complete by:  As directed   Congestive Heart Failure: If diagnosis is Heart Failure, patient MUST meet each of the CMS criteria: 1. Left Ventricular Ejection Fraction </= 35% 2. NYHA class II-IV symptoms despite being on optimal heart failure therapy for at least 6 weeks. 3. Stable = have not had a recent (<6 weeks) or planned (<6 months) major cardiovascular hospitalization or procedure  Program Details: - Physician supervised classes - 1-3 classes per week over a 12-18 week period, generally for a total of 36 sessions  Physician Certification: I certify that the above Cardiac Rehabilitation treatment is  medically necessary and is medically approved by me for treatment of this patient. The patient is willing and cooperative, able to ambulate and medically stable to participate in exercise rehabilitation. The  participant's progress and Individualized Treatment Plan will be reviewed by the Medical Director, Cardiac Rehab staff and as indicated by the Referring/Ordering Physician.  Diagnosis:  CABG          Medications at time of discharge:   Medication List    STOP taking these medications        atenolol 100 MG tablet  Commonly known as:  TENORMIN     doxazosin 4 MG tablet  Commonly known as:  CARDURA     naproxen sodium 220 MG tablet  Commonly known as:  ANAPROX     simvastatin 40 MG tablet  Commonly known as:  ZOCOR      TAKE these medications        amiodarone 400 MG tablet  Commonly known as:  PACERONE  Take 1 tablet (400 mg total) by mouth 2 (two) times daily.     aspirin EC 81 MG tablet  Take 81 mg by mouth daily.     atorvastatin 80 MG tablet  Commonly known as:  LIPITOR  Take 1 tablet (80 mg total) by mouth daily at 6 PM.     furosemide 40 MG tablet  Commonly known as:  LASIX  Take 1 tablet (40 mg total) by mouth daily. For one week then stop.     metFORMIN 500 MG tablet  Commonly known as:  GLUCOPHAGE  Take 500 mg by mouth daily.     Metoprolol Tartrate 75 MG Tabs  Take 75 mg by mouth 2 (two) times daily.     OMEGA 3 PO  Take 2 tablets by mouth daily.     Potassium Chloride ER 20 MEQ Tbcr  Take 20 mEq by mouth daily. For one week then stop.     PRODIGY TWIST TOP LANCETS 28G Misc     quinapril 40 MG tablet  Commonly known as:  ACCUPRIL  Take 40 mg by mouth daily.     traMADol 50 MG tablet  Commonly known as:  ULTRAM  Take 1 tablet (50 mg total) by mouth every 6 (six) hours as needed for moderate pain.     warfarin 5 MG tablet  Commonly known as:  COUMADIN  Take 1 tablet (5 mg total) by mouth daily at 6 PM. Or as directed.        Follow-up Information    Follow up with Purcell Nails, MD.   Specialty:  Cardiothoracic Surgery   Why:  Office will contact with  follow-up appt   Contact information:   [address] [place] [address] [phone]       Follow up with Tereso Newcomer, PA-C On 12/30/2014.   Specialties:  Physician Assistant, Radiology, Interventional Cardiology   Why:  Appointment time is at 9:00 am   Contact information:   [address] [place] [address] [phone]       Follow up with GREEN, [name], MD.   Specialty:  Internal Medicine   Why:  Call for a follow up appointment regarding further diabetes management and surveillance of HGA1C 7.9   Contact information:   [address] [place] [address] [phone]       Follow up with Quintella Reichert, MD On 12/17/2014.   Specialty:  Cardiology  Why:  Appointment is for PT and INR to be drawn (as is on Coumadin) at [facility] and time is at 10:35 am   Contact information:   [address] [place] [address] [phone]       Follow up with [facility].   Why:  Physical Therapist and Nurse Aide   Contact information:   [address] [place] [address] [phone]      The patient has been discharged on:   1.Beta Blocker:  Yes [ y  ]                              No   [   ]                              If No, reason:  2.Ace Inhibitor/ARB: Yes [place][y   ]                                     No  [    ]                                     If No, reason:  3.Statin:   Yes [  y ]                  No  [   ]                  If No, reason:  4.Ecasa:  Yes  [  y ]                  No   [   ]                  If No, reason: Signed: Joshva Labreck PA-C 12/16/2014, 8:06 AM

## 2014-12-11 NOTE — Progress Notes (Signed)
[address]       [place] [address]             [phone]      6 Days Post-Op Procedure(s) (LRB): CORONARY ARTERY BYPASS GRAFTING (CABG), ON PUMP, TIMES FIVE, USING LEFT INTERNAL MAMMARY ARTERY, BILATERAL GREATER SAPHENOUS VEIN HARVESTED ENDOSCOPICALLY (N/A) TRANSESOPHAGEAL ECHOCARDIOGRAM (TEE) (N/A) Subjective: Currently maintaining sinus rhythm Feels better each day  Objective: Vital signs in last 24 hours: Temp:  [97.5 F (36.4 C)-98.5 F (36.9 C)] 98.5 F (36.9 C) (05/26 0441) Pulse Rate:  [73-94] 73 (05/26 0441) Cardiac Rhythm:  [-] Atrial fibrillation (05/25 2111) Resp:  [16-20] 20 (05/26 0441) BP: (119-138)/(80-90) 138/90 mmHg (05/26 0441) SpO2:  [97 %-100 %] 97 % (05/26 0441) Weight:  [256 lb 9.6 oz (116.393 kg)] 256 lb 9.6 oz (116.393 kg) (05/26 0441)  Hemodynamic parameters for last 24 hours:    Intake/Output from previous day: 05/25 0701 - 05/26 0700 In: 480 [P.O.:480] Out: -  Intake/Output this shift:    General appearance: alert, cooperative and no distress Heart: regular rate and rhythm Lungs: clear to auscultation bilaterally Abdomen: soft, nontender Extremities: + edema LE's Wound: incis healing well  Lab Results:  Recent Labs  12/08/14 0853 12/09/14 0410  WBC 9.4 8.2  HGB 9.6* 9.4*  HCT 28.7* 27.8*  PLT 138* 187   BMET:  Recent Labs  12/08/14 0853 12/09/14 0410  NA 135 135  K 3.8 3.6  CL 101 100*  CO2 24 25  GLUCOSE 154* 115*  BUN 19 21*  CREATININE 0.81 0.79  CALCIUM 8.3* 8.3*    PT/INR:  Recent Labs  12/11/14 0416  LABPROT 15.2  INR 1.18   ABG    Component Value Date/Time   PHART 7.381 12/06/2014 0008   HCO3 24.3* 12/06/2014 0008   TCO2 19 12/06/2014 1658   ACIDBASEDEF 1.0 12/06/2014 0008   O2SAT 98.0 12/06/2014 0008   CBG (last 3)   Recent Labs  12/10/14 1703 12/10/14 2110 12/11/14 0617  GLUCAP 112* 127* 113*    Meds Scheduled Meds: . amiodarone  400 mg Oral BID  . aspirin  EC  325 mg Oral Daily  . atorvastatin  80 mg Oral q1800  . bisacodyl  10 mg Oral Daily   Or  . bisacodyl  10 mg Rectal Daily  . docusate sodium  200 mg Oral Daily  . enoxaparin (LOVENOX) injection  40 mg Subcutaneous QHS  . furosemide  40 mg Oral Daily  . insulin aspart  0-15 Units Subcutaneous TID WC  . insulin aspart  0-5 Units Subcutaneous QHS  . lisinopril  10 mg Oral Daily  . metFORMIN  500 mg Oral Q breakfast  . metoprolol tartrate  50 mg Oral BID  . pantoprazole  40 mg Oral Daily  . potassium chloride  40 mEq Oral Daily  . sodium chloride  3 mL Intravenous Q12H  . warfarin  2.5 mg Oral q1800  . warfarin   Does not apply Once  . Warfarin - Physician Dosing Inpatient   Does not apply q1800   Continuous Infusions:  PRN Meds:.sodium chloride, alum & mag hydroxide-simeth, guaiFENesin-dextromethorphan, lactulose, magnesium hydroxide, ondansetron (ZOFRAN) IV, oxyCODONE, sodium chloride, traMADol, zolpidem  Xrays No results found.  Assessment/Plan: S/P Procedure(s) (LRB): CORONARY ARTERY BYPASS GRAFTING (CABG), ON PUMP, TIMES FIVE, USING LEFT INTERNAL MAMMARY ARTERY, BILATERAL GREATER SAPHENOUS VEIN HARVESTED ENDOSCOPICALLY (N/A) TRANSESOPHAGEAL ECHOCARDIOGRAM (TEE) (N/A)  1 conts to show good clinical progression 2  sinus rhythm, QTc is prolonged, will decrease the amiodarone dose 3 INR has decreased , will increase dose of coumadin, check LFT's 4 d/c epw's 5 prob d/c in next 24-48 hours 6 cont lasix  LOS: 11 days    GOLD,WAYNE E 12/11/2014

## 2014-12-11 NOTE — Progress Notes (Signed)
UR Completed. Verdene Creson, RN, BSN.  [phone] 

## 2014-12-11 NOTE — Progress Notes (Addendum)
CARDIAC REHAB PHASE I   PRE:  Rate/Rhythm: 77 SR  BP:  Sitting: 128/84        SaO2: 100 RA  MODE:  Ambulation: 550 ft   POST:  Rate/Rhythm: 84 SR  BP:  Sitting: 123/90         SaO2: 98 RA  Pt has not walked today, pt states he is having pain in his penis from possible foley trauma. Pt states he has been "peeing blood."  Pt needed encouragement to walk, ultimately agreeable. Pt very talkative today, ambulated 550 ft on RA, hand held assist, mildly unsteady gait (pt states he feels steady), tolerated well. Pt still c/o moderate DOE, denies CP, dizziness.  Brief standing rest x 4, declined use of rolling walker. Pt to bed after walk in preparation to d/c EPW, RN at bedside, call bell within reach.  Encouraged pt to ambulate again today and continue IS use.  Pt verbalized understanding.  [phone]    Joylene GrapesMonge, Floyce Bujak C, RN, BSN 12/11/2014 2:17 PM

## 2014-12-11 NOTE — Discharge Instructions (Addendum)
Endoscopic Saphenous Vein Harvesting °Care After °Refer to this sheet in the next few weeks. These instructions provide you with information on caring for yourself after your procedure. Your health care provider may also give you more specific instructions. Your treatment has been planned according to current medical practices, but problems sometimes occur. Call your health care provider if you have any problems or questions after your procedure. °HOME CARE INSTRUCTIONS °Medicine °· Take whatever pain medicine your surgeon prescribes. Follow the directions carefully. Do not take over-the-counter pain medicine unless your surgeon says it is okay. Some pain medicine can cause bleeding problems for several weeks after surgery. °· Follow your surgeon's instructions about driving. You will probably not be permitted to drive after heart surgery. °· Take any medicines your surgeon prescribes. Any medicines you took before your heart surgery should be checked with your health care provider before you start taking them again. °Wound care °· If your surgeon has prescribed an elastic bandage or stocking, ask how long you should wear it. °· Check the area around your surgical cuts (incisions) whenever your bandages (dressings) are changed. Look for any redness or swelling. °· You will need to return to have the stitches (sutures) or staples taken out. Ask your surgeon when to do that. °· Ask your surgeon when you can shower or bathe. °Activity °· Try to keep your legs raised when you are sitting. °· Do any exercises your health care providers have given you. These may include deep breathing exercises, coughing, walking, or other exercises. °SEEK MEDICAL CARE IF: °· You have any questions about your medicines. °· You have more leg pain, especially if your pain medicine stops working. °· New or growing bruises develop on your leg. °· Your leg swells, feels tight, or becomes red. °· You have numbness in your leg. °SEEK IMMEDIATE  MEDICAL CARE IF: °· Your pain gets much worse. °· Blood or fluid leaks from any of the incisions. °· Your incisions become warm, swollen, or red. °· You have chest pain. °· You have trouble breathing. °· You have a fever. °· You have more pain near your leg incision. °MAKE SURE YOU: °· Understand these instructions. °· Will watch your condition. °· Will get help right away if you are not doing well or get worse. °Document Released: 03/16/2011 Document Revised: 07/09/2013 Document Reviewed: 03/16/2011 °ExitCare® Patient Information ©2015 [employer]. This information is not intended to replace advice given to you by your health care provider. Make sure you discuss any questions you have with your health care provider. °Coronary Artery Bypass Grafting, Care After °These instructions give you information on caring for yourself after your procedure. Your doctor may also give you more specific instructions. Call your doctor if you have any problems or questions after your procedure.  °HOME CARE °· Only take medicine as told by your doctor. Take medicines exactly as told. Do not stop taking medicines or start any new medicines without talking to your doctor first. °· Take your pulse as told by your doctor. °· Do deep breathing as told by your doctor. Use your breathing device (incentive spirometer), if given, to practice deep breathing several times a day. Support your chest with a pillow or your arms when you take deep breaths or cough. °· Keep the area clean, dry, and protected where the surgery cuts (incisions) were made. Remove bandages (dressings) only as told by your doctor. If strips were applied to surgical area, do not take them off. They fall off   on their own.  Check the surgery area daily for puffiness (swelling), redness, or leaking fluid.  If surgery cuts were made in your legs:  Avoid crossing your legs.  Avoid sitting for long periods of time. Change positions every 30 minutes.  Raise your legs  when you are sitting. Place them on pillows.  Wear stockings that help keep blood clots from forming in your legs (compression stockings).  Only take sponge baths until your doctor says it is okay to take showers. Pat the surgery area dry. Do not rub the surgery area with a washcloth or towel. Do not bathe, swim, or use a hot tub until your doctor says it is okay.  Eat foods that are high in fiber. These include raw fruits and vegetables, whole grains, beans, and nuts. Choose lean meats. Avoid canned, processed, and fried foods.  Drink enough fluids to keep your pee (urine) clear or pale yellow.  Weigh yourself every day.  Rest and limit activity as told by your doctor. You may be told to:  Stop any activity if you have chest pain, shortness of breath, changes in heartbeat, or dizziness. Get help right away if this happens.  Move around often for short amounts of time or take short walks as told by your doctor. Gradually become more active. You may need help to strengthen your muscles and build endurance.  Avoid lifting, pushing, or pulling anything heavier than 10 pounds (4.5 kg) for at least 6 weeks after surgery.  Do not drive until your doctor says it is okay.  Ask your doctor when you can go back to work.  Ask your doctor when you can begin sexual activity again.  Follow up with your doctor as told. GET HELP IF:  You have puffiness, redness, more pain, or fluid draining from the incision site.  You have a fever.  You have puffiness in your ankles or legs.  You have pain in your legs.  You gain 2 or more pounds (0.9 kg) a day.  You feel sick to your stomach (nauseous) or throw up (vomit).  You have watery poop (diarrhea). GET HELP RIGHT AWAY IF:  You have chest pain that goes to your jaw or arms.  You have shortness of breath.  You have a fast or irregular heartbeat.  You notice a "clicking" in your breastbone when you move.  You have numbness or weakness in  your arms or legs.  You feel dizzy or light-headed. MAKE SURE YOU:  Understand these instructions.  Will watch your condition.  Will get help right away if you are not doing well or get worse. Document Released: 07/09/2013 Document Reviewed: 07/09/2013 [facility] Patient Information 2015 [place], [place]. This information is not intended to replace advice given to you by your health care provider. Make sure you discuss any questions you have with your health care provider. ------------------------------------------------  Information on my medicine - Coumadin   (Warfarin)  This medication education was reviewed with me or my healthcare representative as part of my discharge preparation.  The pharmacist that spoke with me during my hospital stay was:  Benny Lennert, [facility]  Why was Coumadin prescribed for you? Coumadin was prescribed for you because you have a blood clot or a medical condition that can cause an increased risk of forming blood clots. Blood clots can cause serious health problems by blocking the flow of blood to the heart, lung, or brain. Coumadin can prevent harmful blood clots from forming. As a reminder  your indication for Coumadin is:   Stroke Prevention Because Of Atrial Fibrillation  What test will check on my response to Coumadin? While on Coumadin (warfarin) you will need to have an INR test regularly to ensure that your dose is keeping you in the desired range. The INR (international normalized ratio) number is calculated from the result of the laboratory test called prothrombin time (PT).  If an INR APPOINTMENT HAS NOT ALREADY BEEN MADE FOR YOU please schedule an appointment to have this lab work done by your health care provider within 7 days. Your INR goal is usually a number between:  2 to 3 or your provider may give you a more narrow range like 2-2.5.  Ask your health care provider during an office visit what your goal INR is.  What  do you need to  know   About  COUMADIN? Take Coumadin (warfarin) exactly as prescribed by your healthcare provider about the same time each day.  DO NOT stop taking without talking to the doctor who prescribed the medication.  Stopping without other blood clot prevention medication to take the place of Coumadin may increase your risk of developing a new clot or stroke.  Get refills before you run out.  What do you do if you miss a dose? If you miss a dose, take it as soon as you remember on the same day then continue your regularly scheduled regimen the next day.  Do not take two doses of Coumadin at the same time.  Important Safety Information A possible side effect of Coumadin (Warfarin) is an increased risk of bleeding. You should call your healthcare provider right away if you experience any of the following: ? Bleeding from an injury or your nose that does not stop. ? Unusual colored urine (red or dark brown) or unusual colored stools (red or black). ? Unusual bruising for unknown reasons. ? A serious fall or if you hit your head (even if there is no bleeding).  Some foods or medicines interact with Coumadin (warfarin) and might alter your response to warfarin. To help avoid this: ? Eat a balanced diet, maintaining a consistent amount of Vitamin K. ? Notify your provider about major diet changes you plan to make. ? Avoid alcohol or limit your intake to 1 drink for women and 2 drinks for men per day. (1 drink is 5 oz. wine, 12 oz. beer, or 1.5 oz. liquor.)  Make sure that ANY health care provider who prescribes medication for you knows that you are taking Coumadin (warfarin).  Also make sure the healthcare provider who is monitoring your Coumadin knows when you have started a new medication including herbals and non-prescription products.  Coumadin (Warfarin)  Major Drug Interactions  Increased Warfarin Effect Decreased Warfarin Effect  Alcohol (large quantities) Antibiotics (esp. Septra/Bactrim, Flagyl,  Cipro) Amiodarone (Cordarone) Aspirin (ASA) Cimetidine (Tagamet) Megestrol (Megace) NSAIDs (ibuprofen, naproxen, etc.) Piroxicam (Feldene) Propafenone (Rythmol SR) Propranolol (Inderal) Isoniazid (INH) Posaconazole (Noxafil) Barbiturates (Phenobarbital) Carbamazepine (Tegretol) Chlordiazepoxide (Librium) Cholestyramine (Questran) Griseofulvin Oral Contraceptives Rifampin Sucralfate (Carafate) Vitamin K   Coumadin (Warfarin) Major Herbal Interactions  Increased Warfarin Effect Decreased Warfarin Effect  Garlic Ginseng Ginkgo biloba Coenzyme Q10 Green tea St. Johns wort    Coumadin (Warfarin) FOOD Interactions  Eat a consistent number of servings per week of foods HIGH in Vitamin K (1 serving =  cup)  Collards (cooked, or boiled & drained) Kale (cooked, or boiled & drained) Mustard greens (cooked, or boiled & drained) Parsley *serving size  only =  cup Spinach (cooked, or boiled & drained) Swiss chard (cooked, or boiled & drained) Turnip greens (cooked, or boiled & drained)  Eat a consistent number of servings per week of foods MEDIUM-HIGH in Vitamin K (1 serving = 1 cup)  Asparagus (cooked, or boiled & drained) Broccoli (cooked, boiled & drained, or raw & chopped) Brussel sprouts (cooked, or boiled & drained) *serving size only =  cup Lettuce, raw (green leaf, endive, romaine) Spinach, raw Turnip greens, raw & chopped   These websites have more information on Coumadin (warfarin):  http://www.king-russell.com/; https://www.hines.net/; Activity: 1.May walk up steps                2.No lifting more than ten pounds for four weeks.                 3.No driving for four weeks.                4.Stop any activity that causes chest pain, shortness of breath, dizziness,   sweating or excessive weakness.                5.Avoid straining.                6.Continue with your breathing exercises daily.  Diet: Diabetic diet and Low fat, Low salt diet  Wound Care: May  shower.  Clean wounds with mild soap and water daily. Contact the office at [phone] if any problems arise.  Coronary Artery Bypass Grafting, Care After Refer to this sheet in the next few weeks. These instructions provide you with information on caring for yourself after your procedure. Your health care provider may also give you more specific instructions. Your treatment has been planned according to current medical practices, but problems sometimes occur. Call your health care provider if you have any problems or questions after your procedure. WHAT TO EXPECT AFTER THE PROCEDURE Recovery from surgery will be different for everyone. Some people feel well after 3 or 4 weeks, while for others it takes longer. After your procedure, it is typical to have the following:  Nausea and a lack of appetite.   Constipation.  Weakness and fatigue.   Depression or irritability.   Pain or discomfort at your incision site. HOME CARE INSTRUCTIONS  Take medicines only as directed by your health care provider. Do not stop taking medicines or start any new medicines without first checking with your health care provider.  Take your pulse as directed by your health care provider.  Perform deep breathing as directed by your health care provider. If you were given a device called an incentive spirometer, use it to practice deep breathing several times a day. Support your chest with a pillow or your arms when you take deep breaths or cough.  Keep incision areas clean, dry, and protected. Remove or change any bandages (dressings) only as directed by your health care provider. You may have skin adhesive strips over the incision areas. Do not take the strips off. They will fall off on their own.  Check incision areas daily for any swelling, redness, or drainage.  If incisions were made in your legs, do the following:  Avoid crossing your legs.   Avoid sitting for long periods of time. Change positions  every 30 minutes.   Elevate your legs when you are sitting.  Wear compression stockings as directed by your health care provider. These stockings help keep blood clots from forming in your legs.  Take showers  once your health care provider approves. Until then, only take sponge baths. Pat incisions dry. Do not rub incisions with a washcloth or towel. Do not take baths, swim, or use a hot tub until your health care provider approves.  Eat foods that are high in fiber, such as raw fruits and vegetables, whole grains, beans, and nuts. Meats should be lean cut. Avoid canned, processed, and fried foods.  Drink enough fluid to keep your urine clear or pale yellow.  Weigh yourself every day. This helps identify if you are retaining fluid that may make your heart and lungs work harder.  Rest and limit activity as directed by your health care provider. You may be instructed to:  Stop any activity at once if you have chest pain, shortness of breath, irregular heartbeats, or dizziness. Get help right away if you have any of these symptoms.  Move around frequently for short periods or take short walks as directed by your health care provider. Increase your activities gradually. You may need physical therapy or cardiac rehabilitation to help strengthen your muscles and build your endurance.  Avoid lifting, pushing, or pulling anything heavier than 10 lb (4.5 kg) for at least 6 weeks after surgery.  Do not drive until your health care provider approves.  Ask your health care provider when you may return to work.  Ask your health care provider when you may resume sexual activity.  Keep all follow-up visits as directed by your health care provider. This is important. SEEK MEDICAL CARE IF:  You have swelling, redness, increasing pain, or drainage at the site of an incision.  You have a fever.  You have swelling in your ankles or legs.  You have pain in your legs.   You gain 2 or more pounds  (0.9 kg) a day.  You are nauseous or vomit.  You have diarrhea. SEEK IMMEDIATE MEDICAL CARE IF:  You have chest pain that goes to your jaw or arms.  You have shortness of breath.   You have a fast or irregular heartbeat.   You notice a "clicking" in your breastbone (sternum) when you move.   You have numbness or weakness in your arms or legs.  You feel dizzy or light-headed.  MAKE SURE YOU:  Understand these instructions.  Will watch your condition.  Will get help right away if you are not doing well or get worse. Document Released: 01/21/2005 Document Revised: 11/18/2013 Document Reviewed: 12/11/2012 [facility] Patient Information 2015 [place], [place]. This information is not intended to replace advice given to you by your health care provider. Make sure you discuss any questions you have with your health care provider.

## 2014-12-11 NOTE — Progress Notes (Signed)
PT Cancellation Note  Patient Details Name: Jeremy MilletGeorge W Uriarte MRN: [id number] DOB: 1941/07/16   Cancelled Treatment:    Reason Eval/Treat Not Completed: Patient not medically ready. Pt on bedrest as pacing wires just got pulled. Noted that pt ambulated well with cardiac rehab earlier this afternoon. Will check back tomorrow.    Conni SlipperKirkman, Amran Malter 12/11/2014, 3:08 PM   Conni SlipperLaura Bernadett Milian, PT, DPT Acute Rehabilitation Services Pager: [phone]

## 2014-12-11 NOTE — Care Management Note (Signed)
Case Management Note  Patient Details  Name: Jeremy MilletGeorge W Clarke MRN: [id number] Date of Birth: 1941/01/19  Subjective/Objective:    Pt s/p CABG                Action/Plan: PTA pt lived at home with spouse- PT recommending HH which pt is agreeable to will need orders prior to discharge  Expected Discharge Date:                  Expected Discharge Plan:  Home w Home Health Services  In-House Referral:     Discharge planning Services  CM Consult  Post Acute Care Choice:    Choice offered to:     DME Arranged:    DME Agency:     HH Arranged:    HH Agency:     Status of Service:  In process, will continue to follow  Medicare Important Message Given:  Yes Date Medicare IM Given:  12/08/14 Medicare IM give by:    Date Additional Medicare IM Given:    Additional Medicare Important Message give by:     If discussed at Long Length of Stay Meetings, dates discussed:  12/11/14  Additional Comments: 12/10/14- spoke with pt at bedside- per conversation pt states that he has all needed DME that a friend is loaning him including RW, shower chair, BSC- pt would like HH-PT and aide- will ask MD to place order- [facility] Chitara Clonch, [place], [place] 12/11/2014, 9:52 AM

## 2014-12-12 ENCOUNTER — Other Ambulatory Visit: Payer: Self-pay

## 2014-12-12 LAB — URINE CULTURE
Colony Count: NO GROWTH
Culture: NO GROWTH

## 2014-12-12 LAB — GLUCOSE, CAPILLARY
GLUCOSE-CAPILLARY: 158 mg/dL — AB (ref 65–99)
GLUCOSE-CAPILLARY: 136 mg/dL — AB (ref 65–99)
Glucose-Capillary: 127 mg/dL — ABNORMAL HIGH (ref 65–99)

## 2014-12-12 LAB — PROTIME-INR
INR: 1.2 (ref 0.00–1.49)
Prothrombin Time: 15.3 seconds — ABNORMAL HIGH (ref 11.6–15.2)

## 2014-12-12 MED ORDER — METOPROLOL TARTRATE 1 MG/ML IV SOLN: 10.0000 mg | Freq: Once | INTRAVENOUS | Status: DC

## 2014-12-12 MED ORDER — TRAMADOL HCL 50 MG PO TABS: 50.0000 mg | ORAL_TABLET | Freq: Four times a day (QID) | ORAL | Status: DC | PRN

## 2014-12-12 MED ORDER — METOPROLOL TARTRATE 50 MG PO TABS
75.0000 mg | ORAL_TABLET | Freq: Two times a day (BID) | ORAL | Status: DC
  Administered 2014-12-12 – 2014-12-14 (×4): 75 mg via ORAL
  Filled 2014-12-12 (×5): qty 1

## 2014-12-12 MED ORDER — METOPROLOL TARTRATE 50 MG PO TABS: 50.0000 mg | ORAL_TABLET | Freq: Two times a day (BID) | ORAL | Status: DC

## 2014-12-12 MED ORDER — FUROSEMIDE 40 MG PO TABS: 40.0000 mg | ORAL_TABLET | Freq: Every day | ORAL | Status: DC

## 2014-12-12 MED ORDER — AMIODARONE HCL 200 MG PO TABS: 200.0000 mg | ORAL_TABLET | Freq: Two times a day (BID) | ORAL | Status: DC

## 2014-12-12 MED ORDER — WARFARIN SODIUM 5 MG PO TABS: 5.0000 mg | ORAL_TABLET | Freq: Every day | ORAL | Status: DC

## 2014-12-12 MED ORDER — ASPIRIN EC 81 MG PO TBEC
81.0000 mg | DELAYED_RELEASE_TABLET | Freq: Every day | ORAL | Status: DC
  Administered 2014-12-12 – 2014-12-14 (×3): 81 mg via ORAL
  Filled 2014-12-12 (×3): qty 1

## 2014-12-12 MED ORDER — AMIODARONE HCL 200 MG PO TABS
400.0000 mg | ORAL_TABLET | Freq: Two times a day (BID) | ORAL | Status: DC
  Administered 2014-12-12 – 2014-12-14 (×4): 400 mg via ORAL
  Filled 2014-12-12 (×5): qty 2

## 2014-12-12 MED ORDER — LISINOPRIL 10 MG PO TABS: 10.0000 mg | ORAL_TABLET | Freq: Every day | ORAL | Status: DC

## 2014-12-12 MED ORDER — ATORVASTATIN CALCIUM 80 MG PO TABS: 80.0000 mg | ORAL_TABLET | Freq: Every day | ORAL | Status: DC

## 2014-12-12 MED ORDER — POTASSIUM CHLORIDE ER 20 MEQ PO TBCR: 20.0000 meq | EXTENDED_RELEASE_TABLET | Freq: Every day | ORAL | Status: DC

## 2014-12-12 MED ORDER — AMIODARONE IV BOLUS ONLY 150 MG/100ML
150.0000 mg | Freq: Once | INTRAVENOUS | Status: DC
  Filled 2014-12-12: qty 100

## 2014-12-12 NOTE — Progress Notes (Signed)
CARDIAC REHAB PHASE I   Went to ambulate pt, pt HR 135, sinus tach. Pt sitting on side of bed, eating lunch, states he "feels fine."  RN notified. Returned approximately 1 hr later to see pt, pt HR still sustaining 130's, pt lying in bed, denies SOB, dizziness. Spoke with RN, will hold off ambulating for now, MD to see. Will follow up tomorrow.   1450  Joylene GrapesMonge, Khrystyna Schwalm C, RN, BSN 12/12/2014 2:45 PM

## 2014-12-12 NOTE — Progress Notes (Signed)
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 Pt was able to perform transfers and ambulation with mod I without any noted unsteadiness or LOB. Pt declines stair training however states she does not have to go up stairs if he goes in the back way to his home. Recommending follow up with cardiac rehab at d/c.   Follow Up Recommendations  Other (comment) (Cardiac rehab)     Equipment Recommendations  None recommended by PT    Recommendations for Other Services       Precautions / Restrictions Precautions Precautions: Fall;Sternal Precaution Comments: Reviewed sternal precautions Restrictions Weight Bearing Restrictions: Yes (Sternal precautions)    Mobility  Bed Mobility               General bed mobility comments: Pt was received sitting EOB with family present.   Transfers Overall transfer level: Modified independent   Transfers: Sit to/from Stand           General transfer comment: No assist required. VC's for sternal precautions.   Ambulation/Gait Ambulation/Gait assistance: Modified independent (Device/Increase time) Ambulation Distance (Feet): 600 Feet Assistive device: None Gait Pattern/deviations: WFL(Within Functional Limits) Gait velocity: Decreased Gait velocity interpretation: Below normal speed for age/gender General Gait Details: No unsteadiness or LOB noted without AD.    Stairs Stairs:  (Pt declined stair training. States does not have to do steps)          Wheelchair Mobility    Modified Rankin (Stroke Patients Only)       Balance Overall balance assessment: No apparent balance deficits (not formally assessed)                                   Cognition Arousal/Alertness: Awake/alert Behavior During Therapy: WFL for tasks assessed/performed Overall Cognitive Status: Within Functional Limits for tasks assessed                      Exercises      General Comments General comments (skin integrity, edema, etc.): Discussed basic HEP until starts cardiac rehab.      Pertinent Vitals/Pain Pain Assessment: No/denies pain    Home Living                      Prior Function            PT Goals (current goals can now be found in the care plan section) Acute Rehab PT Goals Patient Stated Goal: return home as independent as possible PT Goal Formulation: With patient Time For Goal Achievement: 12/15/14 Potential to Achieve Goals: Good Progress towards PT goals: Progressing toward goals    Frequency  Min 3X/week    PT Plan Current plan remains appropriate    Co-evaluation             End of Session Equipment Utilized During Treatment: Gait belt Activity Tolerance: Patient tolerated treatment well Patient left: Other (comment) (Sitting EOB with family present and call bell by his side)     Time: 2549-8264 PT Time Calculation (min) (ACUTE ONLY): 21 min  Charges:  $Gait Training: 8-22 mins  G Codes:      Rolinda Roan 12/12/2014, 11:20 AM   Rolinda Roan, PT, DPT Acute Rehabilitation Services Pager: [phone]

## 2014-12-12 NOTE — Care Management Note (Addendum)
Case Management Note  Patient Details  Name: [name] MRN: [id number] Date of Birth: [birth year]  Subjective/Objective:    Pt s/p CABG                Action/Plan: PTA pt lived at home with spouse- PT recommending HH which pt is agreeable to will need orders prior to discharge  Expected Discharge Date:                  Expected Discharge Plan:  Home w Home Health Services  In-House Referral:     Discharge planning Services  CM Consult  Post Acute Care Choice:    Choice offered to:     DME Arranged:    DME Agency:     HH Arranged:  PT, Nurse's Aide HH Agency:  [facility]  Status of Service:  In process, will continue to follow  Medicare Important Message Given:  Yes Date Medicare IM Given:  12/08/14 Medicare IM give by:   Lelon Mastsamantha Silverio Hagan Date Additional Medicare IM Given:   12/12/14 Additional Medicare Important Message give by:   Raynald BlendSamantha Drequan Ironside  If discussed at Long Length of Stay Meetings, dates discussed:  12/11/14  Additional Comments:  12/12/14 Raynald BlendSamantha Drequan Ironside, RN, BSN [phone].  CM [facility] bedside [facility] recommendations.  [facility] has already accepted referral.    12/11/14 Raynald BlendSamantha Aleria Maheu, RN, BSN [phone].  CM requested bedside [facility] per PT recommendation from PT consult.  CM offered pt choice of HH, pt chose [facility].  [facility] contacted, referral accepted.  Pt spouse will provide supervision as recommended by PT.   12/10/14- spoke with pt at bedside- per conversation pt states that he has all needed DME that a friend is loaning him including RW, shower chair, BSC- pt would like HH-PT and aide- will ask MD to place order- [name], [place] 12/12/2014, 11:39 AM

## 2014-12-12 NOTE — Progress Notes (Addendum)
      [address]       [place] [id number]             [phone]        7 Days Post-Op Procedure(s) (LRB): CORONARY ARTERY BYPASS GRAFTING (CABG), ON PUMP, TIMES FIVE, USING LEFT INTERNAL MAMMARY ARTERY, BILATERAL GREATER SAPHENOUS VEIN HARVESTED ENDOSCOPICALLY (N/A) TRANSESOPHAGEAL ECHOCARDIOGRAM (TEE) (N/A)  Subjective: Patient states breathing is so so this am. He hopes to go home soon.  Objective: Vital signs in last 24 hours: Temp:  [98.3 F (36.8 C)-98.4 F (36.9 C)] 98.4 F (36.9 C) (05/27 0537) Pulse Rate:  [72-84] 74 (05/27 0537) Cardiac Rhythm:  [-] Atrial fibrillation (05/26 2039) Resp:  [18-21] 21 (05/27 0537) BP: (128-145)/(68-83) 137/83 mmHg (05/27 0537) SpO2:  [95 %-98 %] 95 % (05/27 0537) Weight:  [251 lb 15.8 oz (114.3 kg)] 251 lb 15.8 oz (114.3 kg) (05/27 0537)  Pre op weight 115 kg Current Weight  12/12/14 251 lb 15.8 oz (114.3 kg)      Intake/Output from previous day: 05/26 0701 - 05/27 0700 In: 720 [P.O.:720] Out: 1400 [Urine:1400]   Physical Exam:  Cardiovascular: RRR Pulmonary: Clear to auscultation bilaterally; no rales, wheezes, or rhonchi. Abdomen: Soft, non tender, bowel sounds present. Extremities: Mild bilateral lower extremity edema. Wounds: Sternal wound is clean and dry.  No erythema or signs of infection. Skin edges of right EVH wound slightly separated so there is minor sero sanguinous ooze. No signs of infection.  Lab Results: CBC:No results for input(s): WBC, HGB, HCT, PLT in the last 72 hours. BMET:  Recent Labs  12/11/14 1103  NA 136  K 3.9  CL 101  CO2 26  GLUCOSE 147*  BUN 21*  CREATININE 1.05  CALCIUM 9.0    PT/INR:  Lab Results  Component Value Date   INR 1.20 12/12/2014   INR 1.18 12/11/2014   INR 1.27 12/10/2014   ABG:  INR: Will add last result for INR, ABG once components are confirmed Will add last 4 CBG results once components are confirmed  Assessment/Plan:  1. CV -  Previous a fib. SR in the 70's. On Amiodarone 200 mg bid, Lisinopril 10 mg daily, Lopressor 50 mg bid, Coumadin 5 mg. INR 1.2. Will decrease ecasa to 81 mg as on Coumadin. 2.  Pulmonary - Encourage incentive spirometer 3. Volume Overload - On Lasix 40 mg daily and will continue one week after discharge 4.  Acute blood loss anemia - Last H and H 9.4 and 27.8 5. DM-CBGs 158/167/127. On Metformin 500 mg daily. Pre op HGA1C 7.9. Will need follow up with medical doctor after discharge 6. Will discuss discharge disposition with surgeon  ZIMMERMAN,DONIELLE MPA-C 12/12/2014,7:59 AM  I have seen and examined the patient and agree with the assessment and plan as outlined.  However, [name] has gone back into Afib w/ HR 120.  BP elevated as well.  Will give extra dose IV amiodarone and increase oral doses of both amiodarone and metoprolol.  Hold plans for d/c until rhythm under adequate control.  [name] 12/12/2014 2:30 PM

## 2014-12-12 NOTE — Progress Notes (Signed)
Patient heart rate increased approximately 1335.  Patient had been working with cardiac rehab but HR never decreased after rest.  Dr. Cornelius Moraswen has been notified.

## 2014-12-12 NOTE — Patient Outreach (Addendum)
   This RNCM was successful in making contact  With patient's emergency contact and wife, Elease Hashimotoatricia. Elease Hashimotoatricia satisfied HIPPA identifiers by providing patient's date of birth and address. Elease Hashimotoatricia stated patient was in the hospital and would be discharged on Tomorrow or Sunday, Dec 14, 2014.   This patient and RNCM agreed to make telephonic contact on Monday, Dec 15, 2014.

## 2014-12-13 LAB — GLUCOSE, CAPILLARY
GLUCOSE-CAPILLARY: 136 mg/dL — AB (ref 65–99)
GLUCOSE-CAPILLARY: 135 mg/dL — AB (ref 65–99)
Glucose-Capillary: 120 mg/dL — ABNORMAL HIGH (ref 65–99)
Glucose-Capillary: 141 mg/dL — ABNORMAL HIGH (ref 65–99)

## 2014-12-13 LAB — PROTIME-INR
INR: 1.3 (ref 0.00–1.49)
PROTHROMBIN TIME: 16.3 s — AB (ref 11.6–15.2)

## 2014-12-13 LAB — CREATININE, SERUM
CREATININE: 0.89 mg/dL (ref 0.61–1.24)
GFR calc Af Amer: >60 mL/min (ref >60)

## 2014-12-13 MED ORDER — LISINOPRIL 20 MG PO TABS
20.0000 mg | ORAL_TABLET | Freq: Every day | ORAL | Status: DC
  Administered 2014-12-13 – 2014-12-14 (×2): 20 mg via ORAL
  Filled 2014-12-13 (×2): qty 1

## 2014-12-13 NOTE — Progress Notes (Addendum)
CARDIAC REHAB PHASE I   PRE:  Rate/Rhythm: 78 SR  BP:  Supine:   Sitting: 145/92  Standing:    SaO2: 99% RA  MODE:  Ambulation: 550 ft   POST:  Rate/Rhythm: 88 SR  BP:  Supine:   Sitting: 162/99 rck 155/95 Standing:    SaO2: 98% RA  1007-1045 Pt tolerated ambulation well with assist x1, gait steady. To chair after walk with legs elevated. BP elevated before and after walk. Informed pt's RN regarding elevated DBP.   Artist Paislinty M Alroy Portela, MS, ACSM CCEP

## 2014-12-13 NOTE — Progress Notes (Addendum)
      [address]       [place] [id number]             [phone]      8 Days Post-Op Procedure(s) (LRB): CORONARY ARTERY BYPASS GRAFTING (CABG), ON PUMP, TIMES FIVE, USING LEFT INTERNAL MAMMARY ARTERY, BILATERAL GREATER SAPHENOUS VEIN HARVESTED ENDOSCOPICALLY (N/A) TRANSESOPHAGEAL ECHOCARDIOGRAM (TEE) (N/A)   Subjective:  [name] has no new complaints.  He states he is doing better this morning.  + ambulation +BM  Objective: Vital signs in last 24 hours: Temp:  [97.7 F (36.5 C)-98.6 F (37 C)] 98.5 F (36.9 C) (05/28 0450) Pulse Rate:  [71-125] 71 (05/28 0450) Cardiac Rhythm:  [-] Atrial fibrillation (05/27 2052) Resp:  [17-18] 18 (05/28 0450) BP: (150-167)/(85-103) 150/94 mmHg (05/28 0450) SpO2:  [97 %-99 %] 97 % (05/28 0450) Weight:  [252 lb 3.3 oz (114.4 kg)] 252 lb 3.3 oz (114.4 kg) (05/28 0450)  Intake/Output from previous day: 05/27 0701 - 05/28 0700 In: 1080 [P.O.:1080] Out: 1950 [Urine:1950]  General appearance: alert, cooperative and no distress Heart: regular rate and rhythm Lungs: clear to auscultation bilaterally Abdomen: soft, non-tender; bowel sounds normal; no masses,  no organomegaly Extremities: edema trace Wound: clean, some serous [facility], likely due to seroma formation  Lab Results: No results for input(s): WBC, HGB, HCT, PLT in the last 72 hours. BMET:  Recent Labs  12/11/14 1103 12/13/14 0535  NA 136  --   K 3.9  --   CL 101  --   CO2 26  --   GLUCOSE 147*  --   BUN 21*  --   CREATININE 1.05 0.89  CALCIUM 9.0  --     PT/INR:  Recent Labs  12/13/14 0535  LABPROT 16.3*  INR 1.30   ABG    Component Value Date/Time   PHART 7.381 12/06/2014 0008   HCO3 24.3* 12/06/2014 0008   TCO2 19 12/06/2014 1658   ACIDBASEDEF 1.0 12/06/2014 0008   O2SAT 98.0 12/06/2014 0008   CBG (last 3)   Recent Labs  12/12/14 0600 12/12/14 2134 12/13/14 0619  GLUCAP 127* 136* 120*     Assessment/Plan: S/P Procedure(s) (LRB): CORONARY ARTERY BYPASS GRAFTING (CABG), ON PUMP, TIMES FIVE, USING LEFT INTERNAL MAMMARY ARTERY, BILATERAL GREATER SAPHENOUS VEIN HARVESTED ENDOSCOPICALLY (N/A) TRANSESOPHAGEAL ECHOCARDIOGRAM (TEE) (N/A)  1. CV- Previous A. Fib, maintaining NSR- continue Amiodarone, Lopressor, will increase Lisinopril for HTN 2. INR 1.30, Coumadin ordered at 5 mg daily, continue current dose for now 3. Pulm- off oxygen, no acute issues, continue IS 4. Renal- creatinine okay, remains mildly hypervolemic, continue lasix 5. DM- cbgs controlled, continue Metformin 6. Dispo- patient stable, maintaining NSR this morning, can hopefully d/c in AM if remains in sinus  LOS: 13 days    [name], [name] 12/13/2014  Patient looks great Adjusting Coumadin dose but should be ready for discharge tomorrow  Jeremy Nationseter Van trigt M.D.

## 2014-12-14 LAB — GLUCOSE, CAPILLARY: Glucose-Capillary: 134 mg/dL — ABNORMAL HIGH (ref 65–99)

## 2014-12-14 LAB — PROTIME-INR
INR: 1.42 (ref 0.00–1.49)
Prothrombin Time: 17.4 seconds — ABNORMAL HIGH (ref 11.6–15.2)

## 2014-12-14 MED ORDER — METOPROLOL TARTRATE 75 MG PO TABS: 75.0000 mg | ORAL_TABLET | Freq: Two times a day (BID) | ORAL | Status: DC

## 2014-12-14 MED ORDER — AMIODARONE HCL 400 MG PO TABS: 400.0000 mg | ORAL_TABLET | Freq: Two times a day (BID) | ORAL | Status: DC

## 2014-12-14 NOTE — Progress Notes (Signed)
Patient sutures and IV removed.  Patient given prescriptions and discharge paperwork.

## 2014-12-14 NOTE — Progress Notes (Signed)
      [address]       [place] [id number]             [phone]      9 Days Post-Op Procedure(s) (LRB): CORONARY ARTERY BYPASS GRAFTING (CABG), ON PUMP, TIMES FIVE, USING LEFT INTERNAL MAMMARY ARTERY, BILATERAL GREATER SAPHENOUS VEIN HARVESTED ENDOSCOPICALLY (N/A) TRANSESOPHAGEAL ECHOCARDIOGRAM (TEE) (N/A)   Subjective:  [name] has no complaints this morning.  He states his lower leg incision is no longer draining.  He is ready to go home.  Objective: Vital signs in last 24 hours: Temp:  [98.4 F (36.9 C)-98.9 F (37.2 C)] 98.6 F (37 C) (05/29 0531) Pulse Rate:  [64-71] 68 (05/29 0531) Cardiac Rhythm:  [-] Normal sinus rhythm (05/28 2200) Resp:  [12-20] 12 (05/29 0531) BP: (132-157)/(84-97) 154/97 mmHg (05/29 0531) SpO2:  [99 %-100 %] 100 % (05/29 0531) Weight:  [246 lb 1.6 oz (111.63 kg)] 246 lb 1.6 oz (111.63 kg) (05/29 0531)  Intake/Output from previous day: 05/28 0701 - 05/29 0700 In: 3 [I.V.:3] Out: -   General appearance: alert, cooperative and no distress Heart: regular rate and rhythm Lungs: clear to auscultation bilaterally Abdomen: soft, non-tender; bowel sounds normal; no masses,  no organomegaly Extremities: edema trace Wound: clean and dry  Lab Results: No results for input(s): WBC, HGB, HCT, PLT in the last 72 hours. BMET:  Recent Labs  12/11/14 1103 12/13/14 0535  NA 136  --   K 3.9  --   CL 101  --   CO2 26  --   GLUCOSE 147*  --   BUN 21*  --   CREATININE 1.05 0.89  CALCIUM 9.0  --     PT/INR:  Recent Labs  12/14/14 0421  LABPROT 17.4*  INR 1.42   ABG    Component Value Date/Time   PHART 7.381 12/06/2014 0008   HCO3 24.3* 12/06/2014 0008   TCO2 19 12/06/2014 1658   ACIDBASEDEF 1.0 12/06/2014 0008   O2SAT 98.0 12/06/2014 0008   CBG (last 3)   Recent Labs  12/13/14 1641 12/13/14 2129 12/14/14 0617  GLUCAP 135* 141* 134*    Assessment/Plan: S/P Procedure(s) (LRB): CORONARY ARTERY BYPASS  GRAFTING (CABG), ON PUMP, TIMES FIVE, USING LEFT INTERNAL MAMMARY ARTERY, BILATERAL GREATER SAPHENOUS VEIN HARVESTED ENDOSCOPICALLY (N/A) TRANSESOPHAGEAL ECHOCARDIOGRAM (TEE) (N/A)  1. CV- Maintaining NSR- remains hypertensive- continue Amiodarone, Lopressor, will resume home regimen of ACE 2. Pulm- off oxygen, no acute issues, continue IS 3. INR 1.42, slowly trending up will continue Coumadin at current regimen 4. Renal- creatinine has been WNL, continue Lasix 5. DM- cbgs controlled, will require outpatient followup 6. Dispo- patient stable, will d/c home today   LOS: 14 days    [name], ERIN 12/14/2014

## 2014-12-16 ENCOUNTER — Other Ambulatory Visit: Payer: Self-pay

## 2014-12-16 DIAGNOSIS — I2510 Atherosclerotic heart disease of native coronary artery without angina pectoris: Secondary | ICD-10-CM | POA: Diagnosis not present

## 2014-12-16 DIAGNOSIS — I10 Essential (primary) hypertension: Secondary | ICD-10-CM | POA: Diagnosis not present

## 2014-12-16 DIAGNOSIS — E785 Hyperlipidemia, unspecified: Secondary | ICD-10-CM | POA: Diagnosis not present

## 2014-12-16 DIAGNOSIS — Z7901 Long term (current) use of anticoagulants: Secondary | ICD-10-CM | POA: Diagnosis not present

## 2014-12-16 DIAGNOSIS — Z95818 Presence of other cardiac implants and grafts: Secondary | ICD-10-CM | POA: Diagnosis not present

## 2014-12-16 DIAGNOSIS — E119 Type 2 diabetes mellitus without complications: Secondary | ICD-10-CM | POA: Diagnosis not present

## 2014-12-16 DIAGNOSIS — Z48812 Encounter for surgical aftercare following surgery on the circulatory system: Secondary | ICD-10-CM | POA: Diagnosis not present

## 2014-12-16 NOTE — Patient Outreach (Signed)
Patient was released from the hospital on Dec 14, 2014 after CABG. Patient lives at home with wife, Elease Hashimotoatricia who is his primary caregiver. [facility]. Patient's wife states they have very good support from patient's brother and their daughter.   Plan: Telephone call next week for #2 week of Transition of Care.

## 2014-12-17 ENCOUNTER — Ambulatory Visit (INDEPENDENT_AMBULATORY_CARE_PROVIDER_SITE_OTHER): Payer: Medicare Other | Admitting: *Deleted

## 2014-12-17 DIAGNOSIS — I2510 Atherosclerotic heart disease of native coronary artery without angina pectoris: Secondary | ICD-10-CM | POA: Diagnosis not present

## 2014-12-17 DIAGNOSIS — Z951 Presence of aortocoronary bypass graft: Secondary | ICD-10-CM

## 2014-12-17 DIAGNOSIS — Z7901 Long term (current) use of anticoagulants: Secondary | ICD-10-CM | POA: Diagnosis not present

## 2014-12-17 DIAGNOSIS — E119 Type 2 diabetes mellitus without complications: Secondary | ICD-10-CM | POA: Diagnosis not present

## 2014-12-17 DIAGNOSIS — I480 Paroxysmal atrial fibrillation: Secondary | ICD-10-CM | POA: Diagnosis not present

## 2014-12-17 DIAGNOSIS — Z95818 Presence of other cardiac implants and grafts: Secondary | ICD-10-CM | POA: Diagnosis not present

## 2014-12-17 DIAGNOSIS — I10 Essential (primary) hypertension: Secondary | ICD-10-CM | POA: Diagnosis not present

## 2014-12-17 DIAGNOSIS — Z48812 Encounter for surgical aftercare following surgery on the circulatory system: Secondary | ICD-10-CM | POA: Diagnosis not present

## 2014-12-17 DIAGNOSIS — E785 Hyperlipidemia, unspecified: Secondary | ICD-10-CM | POA: Diagnosis not present

## 2014-12-17 LAB — POCT INR: INR: 4.2

## 2014-12-17 NOTE — Patient Instructions (Signed)

## 2014-12-18 DIAGNOSIS — E118 Type 2 diabetes mellitus with unspecified complications: Secondary | ICD-10-CM | POA: Diagnosis not present

## 2014-12-18 DIAGNOSIS — E1165 Type 2 diabetes mellitus with hyperglycemia: Secondary | ICD-10-CM | POA: Diagnosis not present

## 2014-12-18 DIAGNOSIS — I2510 Atherosclerotic heart disease of native coronary artery without angina pectoris: Secondary | ICD-10-CM | POA: Diagnosis not present

## 2014-12-19 DIAGNOSIS — I10 Essential (primary) hypertension: Secondary | ICD-10-CM | POA: Diagnosis not present

## 2014-12-19 DIAGNOSIS — E785 Hyperlipidemia, unspecified: Secondary | ICD-10-CM | POA: Diagnosis not present

## 2014-12-19 DIAGNOSIS — Z48812 Encounter for surgical aftercare following surgery on the circulatory system: Secondary | ICD-10-CM | POA: Diagnosis not present

## 2014-12-19 DIAGNOSIS — Z95818 Presence of other cardiac implants and grafts: Secondary | ICD-10-CM | POA: Diagnosis not present

## 2014-12-19 DIAGNOSIS — Z7901 Long term (current) use of anticoagulants: Secondary | ICD-10-CM | POA: Diagnosis not present

## 2014-12-19 DIAGNOSIS — E119 Type 2 diabetes mellitus without complications: Secondary | ICD-10-CM | POA: Diagnosis not present

## 2014-12-19 DIAGNOSIS — I2510 Atherosclerotic heart disease of native coronary artery without angina pectoris: Secondary | ICD-10-CM | POA: Diagnosis not present

## 2014-12-23 ENCOUNTER — Ambulatory Visit (INDEPENDENT_AMBULATORY_CARE_PROVIDER_SITE_OTHER): Payer: Medicare Other | Admitting: Pharmacist

## 2014-12-23 DIAGNOSIS — I480 Paroxysmal atrial fibrillation: Secondary | ICD-10-CM

## 2014-12-23 DIAGNOSIS — I4891 Unspecified atrial fibrillation: Secondary | ICD-10-CM

## 2014-12-23 DIAGNOSIS — Z951 Presence of aortocoronary bypass graft: Secondary | ICD-10-CM

## 2014-12-23 LAB — POCT INR: INR: 3.9

## 2014-12-24 ENCOUNTER — Other Ambulatory Visit: Payer: Self-pay

## 2014-12-24 DIAGNOSIS — E119 Type 2 diabetes mellitus without complications: Secondary | ICD-10-CM | POA: Diagnosis not present

## 2014-12-24 DIAGNOSIS — I10 Essential (primary) hypertension: Secondary | ICD-10-CM | POA: Diagnosis not present

## 2014-12-24 DIAGNOSIS — I2510 Atherosclerotic heart disease of native coronary artery without angina pectoris: Secondary | ICD-10-CM | POA: Diagnosis not present

## 2014-12-24 DIAGNOSIS — Z95818 Presence of other cardiac implants and grafts: Secondary | ICD-10-CM | POA: Diagnosis not present

## 2014-12-24 DIAGNOSIS — Z48812 Encounter for surgical aftercare following surgery on the circulatory system: Secondary | ICD-10-CM | POA: Diagnosis not present

## 2014-12-24 DIAGNOSIS — E785 Hyperlipidemia, unspecified: Secondary | ICD-10-CM | POA: Diagnosis not present

## 2014-12-24 DIAGNOSIS — Z7901 Long term (current) use of anticoagulants: Secondary | ICD-10-CM | POA: Diagnosis not present

## 2014-12-26 ENCOUNTER — Other Ambulatory Visit: Payer: Self-pay | Admitting: Thoracic Surgery (Cardiothoracic Vascular Surgery)

## 2014-12-26 DIAGNOSIS — E119 Type 2 diabetes mellitus without complications: Secondary | ICD-10-CM | POA: Diagnosis not present

## 2014-12-26 DIAGNOSIS — Z48812 Encounter for surgical aftercare following surgery on the circulatory system: Secondary | ICD-10-CM | POA: Diagnosis not present

## 2014-12-26 DIAGNOSIS — I10 Essential (primary) hypertension: Secondary | ICD-10-CM | POA: Diagnosis not present

## 2014-12-26 DIAGNOSIS — Z951 Presence of aortocoronary bypass graft: Secondary | ICD-10-CM

## 2014-12-26 DIAGNOSIS — I2510 Atherosclerotic heart disease of native coronary artery without angina pectoris: Secondary | ICD-10-CM | POA: Diagnosis not present

## 2014-12-26 DIAGNOSIS — Z95818 Presence of other cardiac implants and grafts: Secondary | ICD-10-CM | POA: Diagnosis not present

## 2014-12-26 DIAGNOSIS — E785 Hyperlipidemia, unspecified: Secondary | ICD-10-CM | POA: Diagnosis not present

## 2014-12-26 DIAGNOSIS — Z7901 Long term (current) use of anticoagulants: Secondary | ICD-10-CM | POA: Diagnosis not present

## 2014-12-29 ENCOUNTER — Ambulatory Visit (INDEPENDENT_AMBULATORY_CARE_PROVIDER_SITE_OTHER): Payer: Self-pay | Admitting: Physician Assistant

## 2014-12-29 ENCOUNTER — Ambulatory Visit
Admission: RE | Admit: 2014-12-29 | Discharge: 2014-12-29 | Disposition: A | Discharge status: Home / Self Care | Payer: Medicare Other | Source: Ambulatory Visit | Attending: Thoracic Surgery (Cardiothoracic Vascular Surgery) | Admitting: Thoracic Surgery (Cardiothoracic Vascular Surgery)

## 2014-12-29 VITALS — BP 98/60 | HR 60 | Resp 20 | Ht 72.0 in | Wt 225.0 lb

## 2014-12-29 DIAGNOSIS — I10 Essential (primary) hypertension: Secondary | ICD-10-CM | POA: Insufficient documentation

## 2014-12-29 DIAGNOSIS — R918 Other nonspecific abnormal finding of lung field: Secondary | ICD-10-CM | POA: Diagnosis not present

## 2014-12-29 DIAGNOSIS — I2510 Atherosclerotic heart disease of native coronary artery without angina pectoris: Secondary | ICD-10-CM | POA: Insufficient documentation

## 2014-12-29 DIAGNOSIS — E119 Type 2 diabetes mellitus without complications: Secondary | ICD-10-CM | POA: Insufficient documentation

## 2014-12-29 DIAGNOSIS — E785 Hyperlipidemia, unspecified: Secondary | ICD-10-CM | POA: Insufficient documentation

## 2014-12-29 DIAGNOSIS — Z951 Presence of aortocoronary bypass graft: Secondary | ICD-10-CM

## 2014-12-29 NOTE — Progress Notes (Signed)
Cardiology Office Note   Date:  12/30/2014   ID:  [name], DOB [birth year], MRN [id number]  PCP:  Enrique Sack, MD  Cardiologist:  Dr. Tobias Alexander     Chief Complaint  Patient presents with  . Coronary Artery Disease    Status post CABG  . Atrial Fibrillation  . Hospitalization Follow-up     History of Present Illness: [name] is a 74 y.o. male with a hx of HTN, HL, diabetes. Admitted 5/15-5/29 with a non-STEMI. Cardiac catheterization demonstrated severe 3 vessel CAD, mild segmental LV contraction with preserved overall LVEF. Patient was seen by Dr. Cornelius Moras and underwent CABG 12/05/14 (LIMA-LAD, SVG-PDA, SVG-OM1, SVG-OM2, SVG-DX). Postoperative course was complicated by atrial fibrillation. He converted to NSR on amiodarone and beta blocker. He continued to have recurrent episodes of atrial fibrillation. Therefore, he was also placed on Coumadin for anticoagulation.  He returns for FU.  Since discharge, he has been doing well. Chest soreness is improved. He denies significant dyspnea. He is NYHA 2-2b. He denies orthopnea, PND or significant pedal edema. He denies dizziness, near syncope or syncope. He denies fevers, cough, wheezing. He was seen by the PA at Dr. Orvan July office yesterday. Chest x-ray was performed.   Studies/Reports Reviewed Today:  Carotid US 12/03/14 Bilateral ICA 1-39%  LHC 12/01/14 LM: 25% LAD: Ostial 50%, proximal to mid 99%, mid 80%, D1 75% LCx: Proximal 30%, OM1 40%, OM2 99% RCA: Mid 100%, left-right collaterals  Echo 11/30/14 EF 50-55%, inferior HK, grade 1 diastolic dysfunction Mild MR Mild LAE, mild RAE  Past Medical History  Diagnosis Date  . Hypertension   . Diabetes mellitus without complication   . Nephrolithiasis   . Hyperlipidemia   . 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 Rotator cuff repair Left   . Cardiac catheterization N/A 12/01/2014    Procedure: Left Heart Cath and Coronary Angiography;  Surgeon: Tonny Bollman, MD;  Location: [facility] INVASIVE CV LAB;  Service: Cardiovascular;  Laterality: N/A;  . Coronary artery bypass graft N/A 12/05/2014    Procedure: CORONARY ARTERY BYPASS GRAFTING (CABG), ON PUMP, TIMES FIVE, USING LEFT INTERNAL MAMMARY ARTERY, BILATERAL GREATER SAPHENOUS VEIN HARVESTED ENDOSCOPICALLY;  Surgeon: Purcell Nails, MD;  Location: [facility] OR;  Service: Open Heart Surgery;  Laterality: N/A;  LIMA-LAD; SEQ SVG-OM1-OM2; SVG-DIAG; SVG-PD  . Tee without cardioversion N/A 12/05/2014    Procedure: TRANSESOPHAGEAL ECHOCARDIOGRAM (TEE);  Surgeon: Purcell Nails, MD;  Location: [facility] OR;  Service: Open Heart Surgery;  Laterality: N/A;     Current Outpatient Prescriptions  Medication Sig Dispense Refill  . amiodarone (PACERONE) 200 MG tablet Take 1 tablet (200 mg total) by mouth as directed. 200 mg twice daily for 1 week then decrease to 200 mg daily 90 tablet 3  . aspirin EC 81 MG tablet Take 81 mg by mouth daily.    [name] atorvastatin (LIPITOR) 80 MG tablet Take 1 tablet (80 mg total) by mouth daily at 6 PM. 30 tablet 1  . metFORMIN (GLUCOPHAGE) 500 MG tablet Take 500 mg by mouth daily.  0  . Omega-3 Fatty Acids (OMEGA 3 PO) Take 2 tablets by mouth daily.    [name] PRODIGY TWIST TOP LANCETS 28G MISC   0  . quinapril (ACCUPRIL) 40 MG tablet  Take 40 mg by mouth daily.  0  . traMADol (ULTRAM) 50 MG tablet Take 1 tablet (50 mg total) by mouth every 6 (six) hours as needed for moderate pain. 30 tablet 0  . warfarin (COUMADIN) 5 MG tablet Take 1 tablet (5 mg total) by mouth daily at 6 PM. Or as directed. 30 tablet 1  . metoprolol (LOPRESSOR) 50 MG tablet Take 1 tablet (50 mg total) by mouth 2 (two) times daily. 60 tablet 11   No current facility-administered medications for this visit.    Allergies:   Review of patient's allergies indicates  no known allergies.    Social History:  The patient  reports that he has quit smoking. He does not have any smokeless tobacco history on file. He reports that he drinks about 1.2 oz of alcohol per week.   Family History:  The patient's family history includes Alzheimer's disease in his brother and father; Heart attack in his mother; Heart disease in his brother and mother.    ROS:   Please see the history of present illness.   Review of Systems  Constitution: Positive for decreased appetite.  All other systems reviewed and are negative.     PHYSICAL EXAM: VS:  BP 90/60 mmHg  Pulse 52  Ht 6' (1.829 m)  Wt 229 lb 12.8 oz (104.237 kg)  BMI 31.16 kg/m2    Wt Readings from Last 3 Encounters:  12/30/14 229 lb 12.8 oz (104.237 kg)  12/29/14 225 lb (102.059 kg)  12/14/14 246 lb 1.6 oz (111.63 kg)     GEN: Well nourished, well developed, in no acute distress HEENT: normal Neck: no JVD,  no masses Cardiac:  Normal S1/S2, RRR; no murmur, no rubs or gallops, no edema  Respiratory:  clear to auscultation bilaterally, no wheezing, rhonchi or rales. GI: soft, nontender, nondistended, + BS MS: no deformity or atrophy Skin: warm and dry  Neuro:  CNs II-XII intact, Strength and sensation are intact Psych: Normal affect   EKG:  EKG is ordered today.  It demonstrates:   Sinus bradycardia, HR 52, first-degree AV block (PR-2 68 ms), LAD, IVCD   Recent Labs: 11/30/2014: TSH 1.816 12/06/2014: Magnesium 2.1 12/09/2014: Hemoglobin 9.4*; Platelets 187 12/11/2014: ALT 52; BUN 21*; Potassium 3.9; Sodium 136 12/13/2014: Creatinine, Ser 0.89    Lipid Panel    Component Value Date/Time   CHOL 156 11/30/2014 0713   TRIG 78 11/30/2014 0713   HDL 42 11/30/2014 0713   CHOLHDL 3.7 11/30/2014 0713   VLDL 16 11/30/2014 0713   LDLCALC 98 11/30/2014 0713      ASSESSMENT AND PLAN:  Coronary artery disease S/P CABG x 5:  Patient is doing well after recent non-STEMI followed by CABG. He will  continue aspirin, statin, beta blocker, ACE inhibitor. He will be referred to cardiac rehabilitation.  Paroxysmal atrial fibrillation:  Maintaining NSR. His amiodarone was reduced to 200 mg twice a day by the PA at Dr. Orvan July office yesterday.  He is somewhat bradycardic.  BP is also low.  He is not symptomatic.   - Continue Amiodarone 200 mg twice a day 1 week   - Then reduce Amiodarone to 200 mg daily. He will need to remain on this until seen back in follow-up.  - Continue Coumadin for now.  - Decrease Metoprolol Tartrate to 50 mg Twice daily   Essential hypertension:  BP running low.  As noted, reduce beta-blocker dose.  If needed, can reduce ACE inhibitor dose as well.  Hyperlipidemia:  Continue statin.  Managed by PCP.   Diabetes mellitus type 2, controlled:  FU with PCP.     Current medicines are reviewed at length with the patient today.  Concerns regarding medicines are as outlined above.  The following changes have been made:    As above  Labs/ tests ordered today include:   Orders Placed This Encounter  Procedures  . EKG 12-Lead    Disposition:   FU with Dr. Tobias Alexander 6-8 weeks.    Signed, Brynda Rim, MHS 12/30/2014 10:18 AM    [facility] [address] [place], [place], [place]  [address] Phone: [phone]; Fax: [phone]

## 2014-12-29 NOTE — Progress Notes (Signed)
  HPI:  Patient returns for routine postoperative follow-up having undergone CABG x 5  on 01/05/2015. The patient's early postoperative recovery while in the hospital was notable for Atrial Fibrillation.  He required coumadin therapy for this.  Since hospital discharge the patient reports he is doing well.  He states that has no complaints except for some mild back discomfort at times.  He is ambulating without difficulty, but is avoiding the extreme heat.  His appetite and bowel habits are back to normail  He is no longer utilizing pain medications.  His INR is therapeutic and is being monitored appropriately.  He does remain on Amiodarone which has not been reduced since hospital discharge.   Current Outpatient Prescriptions  Medication Sig Dispense Refill  . amiodarone (PACERONE) 400 MG tablet Take 1 tablet (400 mg total) by mouth 2 (two) times daily. 60 tablet 1  . aspirin EC 81 MG tablet Take 81 mg by mouth daily.    [name] atorvastatin (LIPITOR) 80 MG tablet Take 1 tablet (80 mg total) by mouth daily at 6 PM. 30 tablet 1  . metFORMIN (GLUCOPHAGE) 500 MG tablet Take 500 mg by mouth daily.  0  . Metoprolol Tartrate 75 MG TABS Take 75 mg by mouth 2 (two) times daily. 60 tablet 3  . Omega-3 Fatty Acids (OMEGA 3 PO) Take 2 tablets by mouth daily.    . potassium chloride 20 MEQ TBCR Take 20 mEq by mouth daily. For one week then stop. 7 tablet 0  . PRODIGY TWIST TOP LANCETS 28G MISC   0  . quinapril (ACCUPRIL) 40 MG tablet Take 40 mg by mouth daily.  0  . traMADol (ULTRAM) 50 MG tablet Take 1 tablet (50 mg total) by mouth every 6 (six) hours as needed for moderate pain. 30 tablet 0  . warfarin (COUMADIN) 5 MG tablet Take 1 tablet (5 mg total) by mouth daily at 6 PM. Or as directed. 30 tablet 1   No current facility-administered medications for this visit.    Physical Exam:  BP 98/60 mmHg  Pulse 60  Resp 20  Ht 6' (1.829 m)  Wt 225 lb (102.059 kg)  BMI 30.51 kg/m2  SpO2 98%  Gen: no apparent  distress Heart: RRR Lungs: CTA bilaterally Abd: soft non-tender, non-distended Skin: Incisions healing well, no evidence of infection present  Diagnostic Tests:  CXR: post surgical changes, small residual left pleural effusion  A/P:  1. S/P CABG- doing well 2. A.Fib- currently NSR, INR is therapeutic, patient instructed to decrease Amiodarone to 200 mg BID for 1 week, then start 200 mg daily for 1 week and then discontinue the medication 3. Activity- patient instructed he may resume driving.  He was educated on sternal precautions for another 2 weeks.  He was instructed that once 6 weeks from surgery is completed he can resume regular activity, however he should still avoid weight greater than 10-15 lbs.   4. RTC in 3 months with Dr. Charna Busman, PA-C [facility] [phone]

## 2014-12-30 ENCOUNTER — Ambulatory Visit (INDEPENDENT_AMBULATORY_CARE_PROVIDER_SITE_OTHER): Payer: Medicare Other

## 2014-12-30 ENCOUNTER — Ambulatory Visit (INDEPENDENT_AMBULATORY_CARE_PROVIDER_SITE_OTHER): Payer: Medicare Other | Admitting: Physician Assistant

## 2014-12-30 ENCOUNTER — Encounter: Payer: Self-pay | Admitting: Physician Assistant

## 2014-12-30 VITALS — BP 90/60 | HR 52 | Ht 72.0 in | Wt 229.8 lb

## 2014-12-30 DIAGNOSIS — I2510 Atherosclerotic heart disease of native coronary artery without angina pectoris: Secondary | ICD-10-CM

## 2014-12-30 DIAGNOSIS — Z951 Presence of aortocoronary bypass graft: Secondary | ICD-10-CM

## 2014-12-30 DIAGNOSIS — I10 Essential (primary) hypertension: Secondary | ICD-10-CM | POA: Diagnosis not present

## 2014-12-30 DIAGNOSIS — I480 Paroxysmal atrial fibrillation: Secondary | ICD-10-CM

## 2014-12-30 DIAGNOSIS — E119 Type 2 diabetes mellitus without complications: Secondary | ICD-10-CM

## 2014-12-30 DIAGNOSIS — E785 Hyperlipidemia, unspecified: Secondary | ICD-10-CM

## 2014-12-30 LAB — POCT INR: INR: 3.3

## 2014-12-30 MED ORDER — AMIODARONE HCL 200 MG PO TABS: 200.0000 mg | ORAL_TABLET | ORAL | Status: DC

## 2014-12-30 MED ORDER — METOPROLOL TARTRATE 50 MG PO TABS: 50.0000 mg | ORAL_TABLET | Freq: Two times a day (BID) | ORAL | Status: DC

## 2014-12-30 NOTE — Patient Instructions (Signed)
Medication Instructions:  1. CHANGE AMIODARONE TO 200 MG TWICE DAILY FOR 1 WEEK THEN DECREASE TO 200 MG DAILY  2. DECREASE METOPROLOL TARTRATE TO 50 MG TWICE DAILY  Labwork: NONE  Testing/Procedures: NONE  Follow-Up: DR. Delton See 02/20/15 @ 1:45  Any Other Special Instructions Will Be Listed Below (If Applicable). CARDIAC REHAB AT [place]

## 2015-01-02 ENCOUNTER — Other Ambulatory Visit: Payer: Self-pay

## 2015-01-06 ENCOUNTER — Ambulatory Visit (INDEPENDENT_AMBULATORY_CARE_PROVIDER_SITE_OTHER): Payer: Medicare Other | Admitting: Surgery

## 2015-01-06 DIAGNOSIS — Z951 Presence of aortocoronary bypass graft: Secondary | ICD-10-CM

## 2015-01-06 DIAGNOSIS — I480 Paroxysmal atrial fibrillation: Secondary | ICD-10-CM | POA: Diagnosis not present

## 2015-01-06 LAB — POCT INR: INR: 2.3

## 2015-01-08 ENCOUNTER — Other Ambulatory Visit: Payer: Self-pay

## 2015-01-08 NOTE — Patient Outreach (Signed)
  Patient continues to work with HHPT post CABG Surgery.  Patient has very good support at home, with wife Elease Hashimoto as being his primary caregiver.  Patient states he is very happy, and does not have any further needs.  Plan: Discharge from caseload by sending Corrie Mckusick. Christell Constant, CM Assistant, message using In Basket to update advising her of patient completing his case management goals.  Discharge letter sent to primary care physician, Dr. Chilton Si and patient.

## 2015-01-12 ENCOUNTER — Ambulatory Visit (INDEPENDENT_AMBULATORY_CARE_PROVIDER_SITE_OTHER): Payer: Self-pay | Admitting: Surgical

## 2015-01-12 VITALS — BP 128/88 | HR 66 | Resp 20 | Ht 72.0 in | Wt 229.0 lb

## 2015-01-12 DIAGNOSIS — Z951 Presence of aortocoronary bypass graft: Secondary | ICD-10-CM

## 2015-01-12 DIAGNOSIS — Z4889 Encounter for other specified surgical aftercare: Secondary | ICD-10-CM

## 2015-01-12 NOTE — Progress Notes (Signed)
[address]       [place], [id number]             [phone]                  Jeremy MilletGeorge W Clarke [name] #[id number] Date of Birth: [birth year]  Referring OZ:HYQMVD:[name], Dorma RussellEdwin, MD Primary Cardiology: Primary Care:[name], Lorenda IshiharaEDWIN JAY, MD  Chief Complaint:  Follow Up Visit Date of Procedure:12/05/2014  Preoperative Diagnosis:  Severe 3-vessel Coronary Artery Disease  S/P Acute non-STEMI  Postoperative Diagnosis:Same  Procedure:   Coronary Artery Bypass Grafting x 5 Left Internal Mammary Artery to Distal Left Anterior Descending Coronary Artery Saphenous Vein Graft to Posterior Descending Coronary Artery Saphenous Vein Graft to First Obtuse Marginal Branch of Left Circumflex Coronary Artery Sequential Saphenous Vein Graft to Second Obtuse Marginal Branch of Left Circumflex Coronary Artery Sapheonous Vein Graft to Diagonal Branch Coronary Artery Endoscopic Vein Harvest from Bilateral Thighs and Right Lower Leg  Surgeon:[name], MD  Assistant:[name], PA-C  Anesthesia:[name], MD  Operative Findings:  Normal LV systolic function  Good quality LIMA conduit for grafting  Fair to good quality SVG conduit for grafting  Diffuse CAD but good quality target vessels for grafting    History of Present Illness: The patient is a 74 year old male status post the above procedure seen in the office on today's date as he was concerned about some drainage from a chest tube site. He denies fevers, chills or other constitutional symptoms. The drainage has been clear. There is been only scant amount. He otherwise is doing quite well without any specific new concerns.         Zubrod Score: At the time of surgery this patient's most appropriate activity status/level should be described as: []     0    Normal  activity, no symptoms []     1    Restricted in physical strenuous activity but ambulatory, able to do out light work []     2    Ambulatory and capable of self care, unable to do work activities, up and about                 >50 % of waking hours                                                                                   []     3    Only limited self care, in bed greater than 50% of waking hours []     4    Completely disabled, no self care, confined to bed or chair []     5    Moribund  History  Smoking status  . Former Smoker  Smokeless tobacco  . Not on file       No Known Allergies  Current Outpatient Prescriptions  Medication Sig Dispense Refill  . amiodarone (PACERONE) 200 MG tablet Take 1 tablet (200 mg total) by mouth as directed. 200 mg twice daily for 1 week then decrease to 200 mg daily 90 tablet 3  . aspirin EC 81 MG tablet Take 81 mg by mouth daily.    .[name]  atorvastatin (LIPITOR) 80 MG tablet Take 1 tablet (80 mg total) by mouth daily at 6 PM. 30 tablet 1  . metFORMIN (GLUCOPHAGE) 500 MG tablet Take 500 mg by mouth daily.  0  . metoprolol (LOPRESSOR) 50 MG tablet Take 1 tablet (50 mg total) by mouth 2 (two) times daily. 60 tablet 11  . Omega-3 Fatty Acids (OMEGA 3 PO) Take 2 tablets by mouth daily.    [name] PRODIGY TWIST TOP LANCETS 28G MISC   0  . quinapril (ACCUPRIL) 40 MG tablet Take 40 mg by mouth daily.  0  . traMADol (ULTRAM) 50 MG tablet Take 1 tablet (50 mg total) by mouth every 6 (six) hours as needed for moderate pain. 30 tablet 0  . warfarin (COUMADIN) 5 MG tablet Take 1 tablet (5 mg total) by mouth daily at 6 PM. Or as directed. 30 tablet 1   No current facility-administered medications for this visit.       Physical Exam: BP 128/88 mmHg  Pulse 66  Resp 20  Ht 6' (1.829 m)  Wt 229 lb (103.874 kg)  BMI 31.05 kg/m2  SpO2 97%  General appearance: alert, cooperative and no distress Heart: regular rate and rhythm Lungs: clear to auscultation  bilaterally Wound: The rightmost chest tube site has a small scab. I could not obtain any drainage with gentle palpation. There was no erythema. The sternum itself looks to be well-healed.  Diagnostic Studies & Laboratory data:         Recent Radiology Findings: No results found.    I have independently reviewed the above radiology findings and reviewed findings  with the patient.  Recent Labs: Lab Results  Component Value Date   WBC 8.2 12/09/2014   HGB 9.4* 12/09/2014   HCT 27.8* 12/09/2014   PLT 187 12/09/2014   GLUCOSE 147* 12/11/2014   CHOL 156 11/30/2014   TRIG 78 11/30/2014   HDL 42 11/30/2014   LDLCALC 98 11/30/2014   ALT 52 12/11/2014   AST 63* 12/11/2014   NA 136 12/11/2014   K 3.9 12/11/2014   CL 101 12/11/2014   CREATININE 0.89 12/13/2014   BUN 21* 12/11/2014   CO2 26 12/11/2014   TSH 1.816 11/30/2014   INR 2.3 01/06/2015   HGBA1C 7.9* 11/30/2014      Assessment / Plan:   the patient is doing quite well in his recovery following coronary artery bypass grafting times 5. There does not appear to be any infection currently. He is instructed to continue with close observation and let us know if there is any new findings specifically increased redness or any type of purulent drainage. We will see him again in 3 months.      GOLD,WAYNE E 01/12/2015 2:47 PM

## 2015-01-12 NOTE — Patient Instructions (Signed)
Patient given verbal instructions regarding continued observation of his incisions.

## 2015-01-20 ENCOUNTER — Ambulatory Visit (INDEPENDENT_AMBULATORY_CARE_PROVIDER_SITE_OTHER): Payer: Medicare Other | Admitting: *Deleted

## 2015-01-20 DIAGNOSIS — Z951 Presence of aortocoronary bypass graft: Secondary | ICD-10-CM | POA: Diagnosis not present

## 2015-01-20 DIAGNOSIS — I480 Paroxysmal atrial fibrillation: Secondary | ICD-10-CM | POA: Diagnosis not present

## 2015-01-20 LAB — POCT INR: INR: 2.2

## 2015-01-20 MED ORDER — WARFARIN SODIUM 5 MG PO TABS: 5.0000 mg | ORAL_TABLET | Freq: Every day | ORAL | Status: DC

## 2015-01-26 DIAGNOSIS — I2510 Atherosclerotic heart disease of native coronary artery without angina pectoris: Secondary | ICD-10-CM | POA: Diagnosis not present

## 2015-01-26 DIAGNOSIS — E1165 Type 2 diabetes mellitus with hyperglycemia: Secondary | ICD-10-CM | POA: Diagnosis not present

## 2015-01-26 DIAGNOSIS — I10 Essential (primary) hypertension: Secondary | ICD-10-CM | POA: Diagnosis not present

## 2015-01-30 ENCOUNTER — Other Ambulatory Visit: Payer: Self-pay | Admitting: Physician Assistant

## 2015-02-10 ENCOUNTER — Ambulatory Visit (INDEPENDENT_AMBULATORY_CARE_PROVIDER_SITE_OTHER): Payer: Medicare Other | Admitting: *Deleted

## 2015-02-10 DIAGNOSIS — Z951 Presence of aortocoronary bypass graft: Secondary | ICD-10-CM

## 2015-02-10 DIAGNOSIS — I480 Paroxysmal atrial fibrillation: Secondary | ICD-10-CM | POA: Diagnosis not present

## 2015-02-10 LAB — POCT INR: INR: 1.1

## 2015-02-20 ENCOUNTER — Ambulatory Visit (INDEPENDENT_AMBULATORY_CARE_PROVIDER_SITE_OTHER): Payer: Medicare Other | Admitting: Cardiology

## 2015-02-20 ENCOUNTER — Ambulatory Visit (INDEPENDENT_AMBULATORY_CARE_PROVIDER_SITE_OTHER): Payer: Medicare Other | Admitting: *Deleted

## 2015-02-20 ENCOUNTER — Encounter: Payer: Self-pay | Admitting: Cardiology

## 2015-02-20 VITALS — BP 140/88 | HR 50 | Ht 72.0 in | Wt 225.0 lb

## 2015-02-20 DIAGNOSIS — I2510 Atherosclerotic heart disease of native coronary artery without angina pectoris: Secondary | ICD-10-CM

## 2015-02-20 DIAGNOSIS — I10 Essential (primary) hypertension: Secondary | ICD-10-CM | POA: Diagnosis not present

## 2015-02-20 DIAGNOSIS — I4891 Unspecified atrial fibrillation: Secondary | ICD-10-CM

## 2015-02-20 DIAGNOSIS — Z951 Presence of aortocoronary bypass graft: Secondary | ICD-10-CM

## 2015-02-20 DIAGNOSIS — E785 Hyperlipidemia, unspecified: Secondary | ICD-10-CM

## 2015-02-20 DIAGNOSIS — I480 Paroxysmal atrial fibrillation: Secondary | ICD-10-CM

## 2015-02-20 DIAGNOSIS — I214 Non-ST elevation (NSTEMI) myocardial infarction: Secondary | ICD-10-CM

## 2015-02-20 LAB — POCT INR: INR: 1.2

## 2015-02-20 MED ORDER — HYDROCHLOROTHIAZIDE 25 MG PO TABS: 25.0000 mg | ORAL_TABLET | Freq: Every day | ORAL | Status: DC

## 2015-02-20 NOTE — Patient Instructions (Signed)
Medication Instructions:   STOP COUMADIN NOW  STOP AMIODARONE NOW  START TAKING HYDROCHLOROTHIAZIDE 25 MG ONCE DAILY       Follow-Up:  2 MONTHS WITH DR Delton See

## 2015-02-20 NOTE — Progress Notes (Signed)
Patient ID: [name], male   DOB: October 20, 1940, 74 y.o.   MRN: [id number]    Cardiology Office Note   Date:  02/20/2015   ID:  [name], DOB 08/01/1940, MRN [id number]  PCP:  Enrique Sack, MD  Cardiologist:  Dr. Tobias Alexander       History of Present Illness: [name] is a 74 y.o. male with a hx of HTN, HL, diabetes. Admitted 5/15-5/29 with a non-STEMI. Cardiac catheterization demonstrated severe 3 vessel CAD, mild segmental LV contraction with preserved overall LVEF. Patient was seen by Dr. Cornelius Moras and underwent CABG 12/05/14 (LIMA-LAD, SVG-PDA, SVG-OM1, SVG-OM2, SVG-DX). Postoperative course was complicated by atrial fibrillation. He converted to NSR on amiodarone and beta blocker. He continued to have recurrent episodes of atrial fibrillation. Therefore, he was also placed on Coumadin for anticoagulation.  He returns for FU.  Since discharge, he has been doing well. Chest soreness is improved. He denies significant dyspnea. He is NYHA 2-2b. He denies orthopnea, PND or significant pedal edema. He denies dizziness, near syncope or syncope. He denies fevers, cough, wheezing. He was seen by the PA at Dr. Orvan July office yesterday. Chest x-ray was performed.  02/20/2015 - 3 months follow up post CABG, he walks 2 miles daily, no CP or SOB, no palpitations or syncope, no LE edema, orthopnea. He struggles with Coumadin, his last two INRs were too low. Complaint to his meds.   Studies/Reports Reviewed Today:  Carotid US 12/03/14 Bilateral ICA 1-39%  LHC 12/01/14 LM: 25% LAD: Ostial 50%, proximal to mid 99%, mid 80%, D1 75% LCx: Proximal 30%, OM1 40%, OM2 99% RCA: Mid 100%, left-right collaterals  Echo 11/30/14 EF 50-55%, inferior HK, grade 1 diastolic dysfunction Mild MR Mild LAE, mild RAE  Past Medical History  Diagnosis Date  . Hypertension   . Diabetes mellitus without complication   . Nephrolithiasis   . Hyperlipidemia   . 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 Rotator cuff repair Left   . Cardiac catheterization N/A 12/01/2014    Procedure: Left Heart Cath and Coronary Angiography;  Surgeon: Tonny Bollman, MD;  Location: [facility] INVASIVE CV LAB;  Service: Cardiovascular;  Laterality: N/A;  . Coronary artery bypass graft N/A 12/05/2014    Procedure: CORONARY ARTERY BYPASS GRAFTING (CABG), ON PUMP, TIMES FIVE, USING LEFT INTERNAL MAMMARY ARTERY, BILATERAL GREATER SAPHENOUS VEIN HARVESTED ENDOSCOPICALLY;  Surgeon: Purcell Nails, MD;  Location: [facility] OR;  Service: Open Heart Surgery;  Laterality: N/A;  LIMA-LAD; SEQ SVG-OM1-OM2; SVG-DIAG; SVG-PD  . Tee without cardioversion N/A 12/05/2014    Procedure: TRANSESOPHAGEAL ECHOCARDIOGRAM (TEE);  Surgeon: Purcell Nails, MD;  Location: [facility] OR;  Service: Open Heart Surgery;  Laterality: N/A;     Current Outpatient Prescriptions  Medication Sig Dispense Refill  . amiodarone (PACERONE) 200 MG tablet Take 1 tablet (200 mg total) by mouth as directed. 200 mg twice daily for 1 week then decrease to 200 mg daily 90 tablet 3  . aspirin EC 81 MG tablet Take 81 mg by mouth daily.    [name] atorvastatin (LIPITOR) 80 MG tablet Take 1 tablet (80 mg total) by mouth daily at 6 PM. 30 tablet 1  . metFORMIN (GLUCOPHAGE) 500 MG tablet Take 500 mg by mouth daily. Takes 1/2 tablet daily  0  . metoprolol (LOPRESSOR) 50 MG tablet Take 1 tablet (50 mg total) by mouth 2 (two) times daily. 60 tablet 11  . Omega-3 Fatty Acids (OMEGA 3 PO) Take 2 tablets by mouth daily.    [name] PRODIGY TWIST TOP LANCETS 28G MISC   0  . quinapril (ACCUPRIL) 40 MG tablet Take 40 mg by mouth daily.  0  . traMADol (ULTRAM) 50 MG tablet Take 1 tablet (50 mg total) by mouth every 6 (six) hours as needed for moderate pain. 30 tablet 0  . warfarin (COUMADIN) 5 MG tablet Take 1 tablet (5 mg total) by  mouth daily at 6 PM. Or as directed. 30 tablet 1   No current facility-administered medications for this visit.    Allergies:   Review of patient's allergies indicates no known allergies.    Social History:  The patient  reports that he has quit smoking. He does not have any smokeless tobacco history on file. He reports that he drinks about 1.2 oz of alcohol per week.   Family History:  The patient's family history includes Alzheimer's disease in his brother and father; Heart attack in his mother; Heart disease in his brother and mother.    ROS:   Please see the history of present illness.   Review of Systems  Constitution: Positive for decreased appetite.  All other systems reviewed and are negative.   PHYSICAL EXAM: VS:  BP 140/88 mmHg  Pulse 50  Ht 6' (1.829 m)  Wt 225 lb (102.059 kg)  BMI 30.51 kg/m2  SpO2 97%    Wt Readings from Last 3 Encounters:  02/20/15 225 lb (102.059 kg)  01/12/15 229 lb (103.874 kg)  12/30/14 229 lb 12.8 oz (104.237 kg)     GEN: Well nourished, well developed, in no acute distress HEENT: normal Neck: no JVD,  no masses Cardiac:  Normal S1/S2, RRR; no murmur, no rubs or gallops, no edema  Respiratory:  clear to auscultation bilaterally, no wheezing, rhonchi or rales. GI: soft, nontender, nondistended, + BS MS: no deformity or atrophy Skin: warm and dry  Neuro:  CNs II-XII intact, Strength and sensation are intact Psych: Normal affect   EKG:  EKG is ordered today.  It demonstrates:   Sinus bradycardia, HR 52, first-degree AV block (PR-2 68 ms), LAD, IVCD   Recent Labs: 11/30/2014: TSH 1.816 12/06/2014: Magnesium 2.1 12/09/2014: Hemoglobin 9.4*; Platelets 187 12/11/2014: ALT 52; BUN 21*; Potassium 3.9; Sodium 136 12/13/2014: Creatinine, Ser 0.89    Lipid Panel    Component Value Date/Time   CHOL 156 11/30/2014 0713   TRIG 78 11/30/2014 0713   HDL 42 11/30/2014 0713   CHOLHDL 3.7 11/30/2014 0713   VLDL 16 11/30/2014 0713   LDLCALC 98  11/30/2014 0713    TTE: 11/30/2014 - Left ventricle: The cavity size was normal. Systolic function was normal. The estimated ejection fraction was in the range of 50% to 55%. Probable mild hypokinesis of the mid-apicalinferior myocardium. Doppler parameters are consistent with abnormal left ventricular relaxation (grade 1 diastolic dysfunction). - Mitral valve: Calcified annulus. There was mild regurgitation. - Left atrium: The atrium was mildly dilated. - Right atrium: The atrium was mildly dilated.  ECG: Sinus bradycardia, 1. AVB, negative T waves in the lateral leads that are new     ASSESSMENT AND PLAN:  Coronary artery disease S/P CABG x 5:  Patient is doing well after recent non-STEMI followed by CABG. He will continue aspirin, statin, beta blocker, ACE inhibitor. He  will be referred to cardiac rehabilitation.  Paroxysmal atrial fibrillation:  Maintaining NSR. Normal LVEF, mildly dilated LA. I will discontinue his amiodarone and coumadin. Follow up in 2 months. He is instructed to check his pulse for irregularity. Continue Metoprolol Tartrate to 50 mg Twice daily   Essential hypertension:  BP borderline, add HCTZ 25 mg po daily.  Hyperlipidemia:  Continue statin.    Diabetes mellitus type 2, controlled:  FU with PCP.     02/20/2015 2:03 PM    [facility] [address] [place], [place], [place]  [address] Phone: [phone]; Fax: [phone]

## 2015-03-05 ENCOUNTER — Encounter (HOSPITAL_COMMUNITY)
Admission: RE | Admit: 2015-03-05 | Discharge: 2015-03-05 | Disposition: A | Discharge status: Home / Self Care | Payer: Medicare Other | Source: Ambulatory Visit | Attending: Cardiology | Admitting: Cardiology

## 2015-03-05 DIAGNOSIS — Z48812 Encounter for surgical aftercare following surgery on the circulatory system: Secondary | ICD-10-CM | POA: Insufficient documentation

## 2015-03-05 DIAGNOSIS — I252 Old myocardial infarction: Secondary | ICD-10-CM | POA: Insufficient documentation

## 2015-03-05 DIAGNOSIS — Z951 Presence of aortocoronary bypass graft: Secondary | ICD-10-CM | POA: Insufficient documentation

## 2015-03-05 NOTE — Progress Notes (Signed)
Cardiac Rehab Medication Review by a Pharmacist  Does the patient  feel that his/her medications are working for him/her?  YES, pt is very satisfied  Has the patient been experiencing any side effects to the medications prescribed?  No side effects  Does the patient measure his/her own blood pressure or blood glucose at home?  No, check BG but does not have a BP cuff. Usually very controled  Does the patient have any problems obtaining medications due to transportation or finances?   no  Understanding of regimen: excellent Understanding of indications: excellent Potential of compliance: excellent    Pharmacist comments: Pt is very compliant with his medications and very knowledgeable about their indications. He is very happy with his current regimen. He does not use a pill box.   Presley Summerlin C. Marvis Moeller, PharmD Pharmacy Resident  Pager: [phone] 03/05/2015 8:59 AM

## 2015-03-09 ENCOUNTER — Encounter: Payer: Medicare Other | Admitting: Thoracic Surgery (Cardiothoracic Vascular Surgery)

## 2015-03-09 DIAGNOSIS — Z0000 Encounter for general adult medical examination without abnormal findings: Secondary | ICD-10-CM | POA: Diagnosis not present

## 2015-03-09 DIAGNOSIS — I10 Essential (primary) hypertension: Secondary | ICD-10-CM | POA: Diagnosis not present

## 2015-03-09 DIAGNOSIS — E1165 Type 2 diabetes mellitus with hyperglycemia: Secondary | ICD-10-CM | POA: Diagnosis not present

## 2015-03-09 DIAGNOSIS — Z23 Encounter for immunization: Secondary | ICD-10-CM | POA: Diagnosis not present

## 2015-03-09 DIAGNOSIS — D559 Anemia due to enzyme disorder, unspecified: Secondary | ICD-10-CM | POA: Diagnosis not present

## 2015-03-09 DIAGNOSIS — E780 Pure hypercholesterolemia: Secondary | ICD-10-CM | POA: Diagnosis not present

## 2015-03-09 DIAGNOSIS — I2510 Atherosclerotic heart disease of native coronary artery without angina pectoris: Secondary | ICD-10-CM | POA: Diagnosis not present

## 2015-03-09 DIAGNOSIS — Z125 Encounter for screening for malignant neoplasm of prostate: Secondary | ICD-10-CM | POA: Diagnosis not present

## 2015-03-09 DIAGNOSIS — E118 Type 2 diabetes mellitus with unspecified complications: Secondary | ICD-10-CM | POA: Diagnosis not present

## 2015-03-09 NOTE — Progress Notes (Signed)
This encounter was created in error - please disregard.

## 2015-03-11 ENCOUNTER — Encounter (HOSPITAL_COMMUNITY): Payer: Self-pay

## 2015-03-11 ENCOUNTER — Encounter (HOSPITAL_COMMUNITY)
Admission: RE | Admit: 2015-03-11 | Discharge: 2015-03-11 | Disposition: A | Discharge status: Home / Self Care | Payer: Medicare Other | Source: Ambulatory Visit | Attending: Cardiology | Admitting: Cardiology

## 2015-03-11 DIAGNOSIS — Z951 Presence of aortocoronary bypass graft: Secondary | ICD-10-CM | POA: Diagnosis not present

## 2015-03-11 DIAGNOSIS — Z48812 Encounter for surgical aftercare following surgery on the circulatory system: Secondary | ICD-10-CM | POA: Diagnosis not present

## 2015-03-11 DIAGNOSIS — I252 Old myocardial infarction: Secondary | ICD-10-CM | POA: Diagnosis not present

## 2015-03-11 LAB — GLUCOSE, CAPILLARY
Glucose-Capillary: 139 mg/dL — ABNORMAL HIGH (ref 65–99)
Glucose-Capillary: 99 mg/dL (ref 65–99)

## 2015-03-11 NOTE — Progress Notes (Signed)
Pt started cardiac rehab today.  Pt tolerated light exercise without difficulty. VSS, telemetry-normal sinus rhythm, asymptomatic.  Medication list reconciled.  Pt verbalized compliance with medications and denies barriers to compliance. PSYCHOSOCIAL ASSESSMENT:  PHQ-0. Pt exhibits positive coping skills, hopeful outlook with supportive family. No psychosocial needs identified at this time, no psychosocial interventions necessary.    Pt enjoys riding bicycles with his brother.  Pt was working part time however he is now ready to retire since his recent cardiac event.  Pt cardiac rehab  goal is  to increase endurance and travel more.  Pt encouraged to participate in risk factor education with active participation in  home execise to increase ability to achieve these goals.    Pt oriented to exercise equipment and routine.  Understanding verbalized.

## 2015-03-13 ENCOUNTER — Encounter (HOSPITAL_COMMUNITY)
Admission: RE | Admit: 2015-03-13 | Discharge: 2015-03-13 | Disposition: A | Discharge status: Home / Self Care | Payer: Medicare Other | Source: Ambulatory Visit | Attending: Cardiology | Admitting: Cardiology

## 2015-03-13 DIAGNOSIS — Z48812 Encounter for surgical aftercare following surgery on the circulatory system: Secondary | ICD-10-CM | POA: Diagnosis not present

## 2015-03-13 DIAGNOSIS — I252 Old myocardial infarction: Secondary | ICD-10-CM | POA: Diagnosis not present

## 2015-03-13 DIAGNOSIS — Z951 Presence of aortocoronary bypass graft: Secondary | ICD-10-CM | POA: Diagnosis not present

## 2015-03-16 ENCOUNTER — Other Ambulatory Visit: Payer: Self-pay | Admitting: *Deleted

## 2015-03-16 ENCOUNTER — Encounter (HOSPITAL_COMMUNITY)
Admission: RE | Admit: 2015-03-16 | Discharge: 2015-03-16 | Disposition: A | Discharge status: Home / Self Care | Payer: Medicare Other | Source: Ambulatory Visit | Attending: Cardiology | Admitting: Cardiology

## 2015-03-16 ENCOUNTER — Ambulatory Visit (INDEPENDENT_AMBULATORY_CARE_PROVIDER_SITE_OTHER): Payer: Medicare Other | Admitting: Thoracic Surgery (Cardiothoracic Vascular Surgery)

## 2015-03-16 ENCOUNTER — Encounter: Payer: Self-pay | Admitting: Thoracic Surgery (Cardiothoracic Vascular Surgery)

## 2015-03-16 VITALS — BP 140/90 | HR 58 | Resp 20 | Ht 69.0 in | Wt 226.0 lb

## 2015-03-16 DIAGNOSIS — R911 Solitary pulmonary nodule: Secondary | ICD-10-CM

## 2015-03-16 DIAGNOSIS — Z48812 Encounter for surgical aftercare following surgery on the circulatory system: Secondary | ICD-10-CM | POA: Diagnosis not present

## 2015-03-16 DIAGNOSIS — Z951 Presence of aortocoronary bypass graft: Secondary | ICD-10-CM

## 2015-03-16 DIAGNOSIS — I252 Old myocardial infarction: Secondary | ICD-10-CM | POA: Diagnosis not present

## 2015-03-16 NOTE — Progress Notes (Addendum)
[address]       [place] [address]             [phone]     CARDIOTHORACIC SURGERY OFFICE NOTE  Referring Provider is Tonny Bollman, MD Primary Cardiologist is Tobias Alexander, MD PCP is GREEN, [name], MD   HPI:  Patient returns to the office today for routine follow-up status post coronary artery bypass grafting 5 on 12/05/2014 for severe three-vessel coronary artery disease status post acute non-ST segment elevation myocardial infarction.  The patient's early postoperative recovery in the hospital was uncomplicated with the exception of the development of postoperative atrial fibrillation for which he was treated with amiodarone and anticoagulated using warfarin.  He was eventually discharged from the hospital on the ninth postoperative day. Since hospital discharge the patient has done very well clinically.  He has been seen in follow-up on 2 occasions in our office, most recently on 01/12/2015. More recently he has been seen in follow-up by Dr. Delton See on 02/20/2015. At that time he was maintaining sinus rhythm. Both amiodarone and warfarin were discontinued. The patient returns to our office for routine follow-up today. He reports that he is doing exceptionally well. He exercises on a regular basis, typically riding his bicycle for as much as 5-10 miles at a time. He denies any symptoms of exertional chest pain or chest tightness. He is not having shortness of breath. He is not having palpitations or dizzy spells.   Current Outpatient Prescriptions  Medication Sig Dispense Refill  . aspirin EC 81 MG tablet Take 81 mg by mouth daily.    [name] atorvastatin (LIPITOR) 80 MG tablet Take 1 tablet (80 mg total) by mouth daily at 6 PM. 30 tablet 1  . metFORMIN (GLUCOPHAGE) 500 MG tablet Take 250 mg by mouth daily with breakfast.    . metoprolol (LOPRESSOR) 50 MG tablet Take 1 tablet (50 mg total) by mouth 2 (two) times daily. 60 tablet 11  . Omega-3 Fatty Acids  (OMEGA 3 PO) Take 2 tablets by mouth daily.    [name] PRODIGY TWIST TOP LANCETS 28G MISC   0  . quinapril (ACCUPRIL) 40 MG tablet Take 40 mg by mouth daily.  0  . hydrochlorothiazide (HYDRODIURIL) 25 MG tablet Take 1 tablet (25 mg total) by mouth daily. (Patient not taking: Reported on 03/16/2015) 90 tablet 3   No current facility-administered medications for this visit.      Physical Exam:   BP 140/90 mmHg  Pulse 58  Resp 20  Ht 5\' 9"  (1.753 m)  Wt 226 lb (102.513 kg)  BMI 33.36 kg/m2  SpO2 98%  General:  Well-appearing  Chest:   clear  CV:   Regular rate and rhythm  Incisions:  Completely healed, sternum is stable  Abdomen:  Soft and nontender  Extremities:  Warm and well perfused, no lower extremity edema  Diagnostic Tests:  CHEST 2 VIEW  COMPARISON: Dec 08, 2014  FINDINGS: There is no edema or consolidation. There is a 1.5 x 1.2 cm nodular opacity overlying the left heart, seen only on the frontal view. Heart is upper normal in size with pulmonary vascularity within normal limits. No adenopathy. Patient is status post coronary artery bypass grafting. No pneumothorax.  IMPRESSION: No edema or consolidation. There is a 1.5 x 1.2 cm nodular opacity overlying the left heart border seen on frontal view. This opacity is somewhat better seen compared to recent prior studies, likely due to a slightly  deeper degree of inspiration at this time. This opacity may well represent overlapping of vascular structures. As it is vaguely seen on prior studies as well as on the current study, it would be prudent to consider noncontrast enhanced chest CT for further assessment to exclude the possibility of an underlying pulmonary nodular lesion in this area.  These results will be called to the ordering clinician or representative by the Radiologist Assistant, and communication documented in the PACS or zVision Dashboard.   Electronically Signed  By: Bretta Bang III  M.D.  On: 12/29/2014 12:36    Impression:  Patient is doing very well more than 3 months following coronary artery bypass grafting.  I have personally reviewed the chest radiograph performed 12/29/2014. I am not impressed by the findings regarding the possibility of a lung nodule as reported by the interpreting radiologist. I feel that there is not likely a significant nodule present.  Plan:  We will obtain a limited CT scan of the chest to follow-up the questionable lung nodule reported on the patient's most recent chest x-ray. I'm skeptical that we will find anything to be concerned about.  I have encouraged the patient to continue to increase his physical activity without any particular limitations at this time.  We have discussed how important it will remain for him to keep his diabetes under very tight control. The benefits of continued regular exercise and a heart healthy diet been discussed. We have not recommended any changes to his current medications at this time. The patient will return for routine follow-up next May, approximately 1 year following his original surgery. All of his questions been addressed.  I spent in excess of 15 minutes during the conduct of this office consultation and >50% of this time involved direct face-to-face encounter with the patient for counseling and/or coordination of their care.    Salvatore Decent. Cornelius Moras, MD 03/16/2015 2:49 PM

## 2015-03-16 NOTE — Patient Instructions (Signed)
Continue all previous medications without any changes at this time  Make every effort to keep your diabetes under very tight control.  Follow up closely with your primary care physician or endocrinologist and strive to keep their hemoglobin A1c levels as low as possible, preferably near or below 6.0.  The long term benefits of strict control of diabetes are far reaching and critically important for your overall health and survival.  Make every effort to stay physically active, get some type of exercise on a regular basis, and stick to a "heart healthy diet".  The long term benefits for regular exercise and a healthy diet are critically important to your overall health and wellbeing.   

## 2015-03-18 ENCOUNTER — Ambulatory Visit: Payer: Self-pay | Admitting: Cardiology

## 2015-03-18 ENCOUNTER — Encounter (HOSPITAL_COMMUNITY)
Admission: RE | Admit: 2015-03-18 | Discharge: 2015-03-18 | Disposition: A | Discharge status: Home / Self Care | Payer: Medicare Other | Source: Ambulatory Visit | Attending: Cardiology | Admitting: Cardiology

## 2015-03-18 DIAGNOSIS — Z951 Presence of aortocoronary bypass graft: Secondary | ICD-10-CM

## 2015-03-18 DIAGNOSIS — Z48812 Encounter for surgical aftercare following surgery on the circulatory system: Secondary | ICD-10-CM | POA: Diagnosis not present

## 2015-03-18 DIAGNOSIS — I4891 Unspecified atrial fibrillation: Secondary | ICD-10-CM

## 2015-03-18 DIAGNOSIS — I252 Old myocardial infarction: Secondary | ICD-10-CM | POA: Diagnosis not present

## 2015-03-18 NOTE — Progress Notes (Signed)
Pt completed Quality of Life survey as a participant in Cardiac Rehab. pt scored well in all areas. No psychosocial needs identified, no interventions necessary. Pt offered emotional support and reassurance. Pt BP mildly elevated upon arrival to cardiac rehab today.  Rehab report will be forwarded to cardiologist for review.  Pt asymptomatic with no change in regimen.  Will continue to monitor.

## 2015-03-19 ENCOUNTER — Ambulatory Visit
Admission: RE | Admit: 2015-03-19 | Discharge: 2015-03-19 | Disposition: A | Discharge status: Home / Self Care | Payer: Medicare Other | Source: Ambulatory Visit | Attending: Thoracic Surgery (Cardiothoracic Vascular Surgery) | Admitting: Thoracic Surgery (Cardiothoracic Vascular Surgery)

## 2015-03-19 DIAGNOSIS — R911 Solitary pulmonary nodule: Secondary | ICD-10-CM

## 2015-03-19 DIAGNOSIS — I517 Cardiomegaly: Secondary | ICD-10-CM | POA: Diagnosis not present

## 2015-03-19 DIAGNOSIS — I712 Thoracic aortic aneurysm, without rupture: Secondary | ICD-10-CM | POA: Diagnosis not present

## 2015-03-20 ENCOUNTER — Encounter (HOSPITAL_COMMUNITY)
Admission: RE | Admit: 2015-03-20 | Discharge: 2015-03-20 | Disposition: A | Discharge status: Home / Self Care | Payer: Medicare Other | Source: Ambulatory Visit | Attending: Cardiology | Admitting: Cardiology

## 2015-03-20 DIAGNOSIS — Z48812 Encounter for surgical aftercare following surgery on the circulatory system: Secondary | ICD-10-CM | POA: Insufficient documentation

## 2015-03-20 DIAGNOSIS — Z951 Presence of aortocoronary bypass graft: Secondary | ICD-10-CM | POA: Insufficient documentation

## 2015-03-20 DIAGNOSIS — I252 Old myocardial infarction: Secondary | ICD-10-CM | POA: Diagnosis not present

## 2015-03-20 NOTE — Progress Notes (Signed)
Reviewed home exercise with pt today.  Pt plans to walk and ride bike for exercise in addition to coming to [facility].  Reviewed THR, pulse, RPE, sign and symptoms, and when to call 911 or MD.  Pt voiced understanding.     [place], ACSM RCEP

## 2015-03-24 ENCOUNTER — Telehealth (HOSPITAL_COMMUNITY): Payer: Self-pay | Admitting: Cardiac Rehabilitation

## 2015-03-24 NOTE — Telephone Encounter (Signed)
-----   Message from Lars Masson, MD sent at 03/23/2015  7:14 AM EDT ----- Regarding: RE: cardiac rehab Just continue to monitor, thank you  ----- Message -----    From: Robyne Peers, RN    Sent: 03/18/2015   3:38 PM      To: Lars Masson, MD Subject: cardiac rehab                                  Dear Dr. Delton See,  Pt hypertensive pre exercise at cardiac rehab today.  BP:  160/98 at rest.  Improved with exercise and down to 120/88 post.  This is the only incidence of HTN noted here. We also notice bradycardia at rest- lowest HR-45.  We will continue to monitor.  Do you have any other recommendations?  Thank you, Deveron Furlong, RN, BSN Cardiac Pulmonary Rehab

## 2015-03-25 ENCOUNTER — Encounter (HOSPITAL_COMMUNITY)
Admission: RE | Admit: 2015-03-25 | Discharge: 2015-03-25 | Disposition: A | Discharge status: Home / Self Care | Payer: Medicare Other | Source: Ambulatory Visit | Attending: Cardiology | Admitting: Cardiology

## 2015-03-25 DIAGNOSIS — I252 Old myocardial infarction: Secondary | ICD-10-CM | POA: Diagnosis not present

## 2015-03-25 DIAGNOSIS — Z951 Presence of aortocoronary bypass graft: Secondary | ICD-10-CM | POA: Diagnosis not present

## 2015-03-25 DIAGNOSIS — Z48812 Encounter for surgical aftercare following surgery on the circulatory system: Secondary | ICD-10-CM | POA: Diagnosis not present

## 2015-03-27 ENCOUNTER — Telehealth: Payer: Self-pay | Admitting: *Deleted

## 2015-03-27 ENCOUNTER — Encounter (HOSPITAL_COMMUNITY)
Admission: RE | Admit: 2015-03-27 | Discharge: 2015-03-27 | Disposition: A | Discharge status: Home / Self Care | Payer: Medicare Other | Source: Ambulatory Visit | Attending: Cardiology | Admitting: Cardiology

## 2015-03-27 DIAGNOSIS — Z951 Presence of aortocoronary bypass graft: Secondary | ICD-10-CM | POA: Diagnosis not present

## 2015-03-27 DIAGNOSIS — I252 Old myocardial infarction: Secondary | ICD-10-CM | POA: Diagnosis not present

## 2015-03-27 DIAGNOSIS — I480 Paroxysmal atrial fibrillation: Secondary | ICD-10-CM

## 2015-03-27 DIAGNOSIS — I10 Essential (primary) hypertension: Secondary | ICD-10-CM

## 2015-03-27 DIAGNOSIS — Z48812 Encounter for surgical aftercare following surgery on the circulatory system: Secondary | ICD-10-CM | POA: Diagnosis not present

## 2015-03-27 MED ORDER — METOPROLOL TARTRATE 50 MG PO TABS: ORAL_TABLET | ORAL | Status: DC

## 2015-03-27 NOTE — Progress Notes (Signed)
Patient noted to have HR of 40-42 SB at the initiation of exercise today.  Joann Rion recently sent ECG tracings to Dr. Delton See for review with HR at lowest of 45 SB.  Patient asymptomatic and shared he typically has a low heart rate. BP 118/98.  Called the office and provided information to Jarrett Ables.  Patient put on phone to receive instructions from [facility]. Patient understands to hold beta blocker dose tonight and starting tomorrow will begin to take  tablet ( ) of Lopressor twice daily.

## 2015-03-27 NOTE — Telephone Encounter (Signed)
Call from [facility] at Cardiac Rehab. Pt is there exercising. His HR is sinus brady 40.  BP is 118/98 She reports that he has no complaints and feels fine. She would like to inform Dr. Delton See and find out if there is a guideline Dr. Delton See wants them to use as far as do not exercise if HR< ??.  Advised patient can exercise today since he is asymptomatic.  Discussed with Nada Boozer, NP (FLEX) Rec hold evening dose of beta blocker and starting tomorrow decrease to 1/2 tablet (25 mg) Lopressor twice daily.  Called back to Cardiac Rehab and infomed Sue Lush and the patient, who verbalizes understanding and agreement. He reports to me that he feels fine and his HR is "about where it always is".

## 2015-03-29 NOTE — Telephone Encounter (Signed)
I agree with taking only half dose of metoprolol. There is no contraindication to exercise with low HR if the patient is asymptomatic.

## 2015-03-30 ENCOUNTER — Encounter (HOSPITAL_COMMUNITY)
Admission: RE | Admit: 2015-03-30 | Discharge: 2015-03-30 | Disposition: A | Discharge status: Home / Self Care | Payer: Medicare Other | Source: Ambulatory Visit | Attending: Cardiology | Admitting: Cardiology

## 2015-03-30 ENCOUNTER — Telehealth: Payer: Self-pay | Admitting: Cardiology

## 2015-03-30 MED ORDER — AMLODIPINE BESYLATE 5 MG PO TABS: 5.0000 mg | ORAL_TABLET | Freq: Every day | ORAL | Status: DC

## 2015-03-30 NOTE — Telephone Encounter (Signed)
Ivy, could you please arrange? Thank you, KN 

## 2015-03-30 NOTE — Addendum Note (Signed)
Addended by: Laurice Record on: 03/30/2015 09:34 AM   Modules accepted: Orders

## 2015-03-30 NOTE — Telephone Encounter (Signed)
Called patient about message below. Sent in prescription for amlodipine 5 mg by mouth daily to patient's pharmacy of choice. Patient verbalized understanding.

## 2015-03-30 NOTE — Progress Notes (Signed)
[name] 74 y.o. male Nutrition Note Spoke with pt. Nutrition Plan and Nutrition Survey goals reviewed with pt. Pt is following Step 2 of the Therapeutic Lifestyle Changes diet. Pt wants to lose wt. Pt states he lost wt after surgery. Pt wt is down 19 lb over the past 3 months. Wt loss tips reviewed. Pt is diabetic. Last A1c indicates blood glucose not optimally controlled. Pt states his A1c was rechecked in August and was "6.9 or 7.1." This Clinical research associate went over Diabetes Education test results. Pt checks CBG's once daily. Fasting CBG's reportedly 89-103 mg/dL. Pt expressed understanding of the information reviewed. Pt aware of nutrition education classes offered.  Lab Results  Component Value Date   HGBA1C 7.9* 11/30/2014   Vitals - 1 value per visit 03/16/2015 03/09/2015 03/05/2015 02/20/2015 01/12/2015  Weight (lb) 226 226 226.19 225 229   Vitals - 1 value per visit 12/30/2014 12/29/2014 12/14/2014  Weight (lb) 229.8 225 246.1    Nutrition Diagnosis ? Food-and nutrition-related knowledge deficit related to lack of exposure to information as related to diagnosis of: ? CVD ? DM ? 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 Pt's individual nutrition plan reviewed with pt. ? Benefits of adopting Therapeutic Lifestyle Changes discussed when Medficts reviewed. ? Pt to attend the Portion Distortion class ? Pt to attend the Diabetes Q & A class ? Pt to attend the   ? Nutrition I class                     ? Nutrition II class       ? Diabetes Blitz class ? Pt given handouts for: ? Nutrition I class ? Nutrition II class ? Diabetes Blitz Class  ? Continue client-centered nutrition education by RD, as part of interdisciplinary care. Goal(s) ? Pt to identify food quantities  necessary to achieve: ? wt loss to a goal wt of 202-220 lb (91.7-99.9 kg) at graduation from cardiac rehab.  ? CBG concentrations in the normal range or as close to normal as is safely possible. Monitor and Evaluate progress toward nutrition goal with team. Nutrition Risk: Change to Moderate [name], M.Ed, RD, LDN, CDE 03/30/2015 9:58 AM

## 2015-03-30 NOTE — Progress Notes (Signed)
Pt returns to exercise today,  Pre exercise  bp 142/102.  Pt placed in a seated position.  Rechecked bp left arm 164/100.  Paged cardmaster.  Await response back.  Rechecked bp again bp left arm 160/96 and right arm 154/90.  Received call back from cardmaster. Advised to call Nada Boozer.  Vernona Rieger paged.  Updated with this morning events and medication review - Pt is not taking hydrodiuril per primary MD recommendations due to allergy to this medication 20 years ago.  Will plan to add amlodipine to his medication regimen.  Vernona Rieger to contact office for prescription to be sent to pt pharmacy.  Advised pt not to exercise today in cardiac rehab.  Per Vernona Rieger advised to exercise lightly this evening with some walking after he starts the amlodipine.  Will follow up on Wednesday.  Advisee pt to consider purchasing a bp cuff for home use. Pt said 'ok". Alanson Aly, BSN

## 2015-03-30 NOTE — Telephone Encounter (Signed)
Pt with HTn up to 160 systolic, after decrease in BB on Friday due to HR in the 40s.  Will add amlodipine 5 mg daily. Will ask office to call in for him.

## 2015-04-01 ENCOUNTER — Encounter (HOSPITAL_COMMUNITY)
Admission: RE | Admit: 2015-04-01 | Discharge: 2015-04-01 | Disposition: A | Discharge status: Home / Self Care | Payer: Medicare Other | Source: Ambulatory Visit | Attending: Cardiology | Admitting: Cardiology

## 2015-04-01 DIAGNOSIS — I252 Old myocardial infarction: Secondary | ICD-10-CM | POA: Diagnosis not present

## 2015-04-01 DIAGNOSIS — Z951 Presence of aortocoronary bypass graft: Secondary | ICD-10-CM | POA: Diagnosis not present

## 2015-04-01 DIAGNOSIS — Z48812 Encounter for surgical aftercare following surgery on the circulatory system: Secondary | ICD-10-CM | POA: Diagnosis not present

## 2015-04-01 NOTE — Progress Notes (Addendum)
30 day psychosocial assessment:  No psychosocial needs identified, no intervention necessary.  Pt feels he is working towards his goals. Pt does admit that he has increased energy.  Pt continues to have elevated diastolic BP.  Pt recently started amlodipine .  Pt educated on importance of following low sodium diet to assist with BP control.   Pt is exercising on his own at home walking  and is anxious to return to his normal routine. Will continue to monitor.

## 2015-04-03 ENCOUNTER — Encounter (HOSPITAL_COMMUNITY)
Admission: RE | Admit: 2015-04-03 | Discharge: 2015-04-03 | Disposition: A | Discharge status: Home / Self Care | Payer: Medicare Other | Source: Ambulatory Visit | Attending: Cardiology | Admitting: Cardiology

## 2015-04-03 ENCOUNTER — Telehealth: Payer: Self-pay | Admitting: Physician Assistant

## 2015-04-03 DIAGNOSIS — Z951 Presence of aortocoronary bypass graft: Secondary | ICD-10-CM | POA: Diagnosis not present

## 2015-04-03 DIAGNOSIS — Z48812 Encounter for surgical aftercare following surgery on the circulatory system: Secondary | ICD-10-CM | POA: Diagnosis not present

## 2015-04-03 DIAGNOSIS — I252 Old myocardial infarction: Secondary | ICD-10-CM | POA: Diagnosis not present

## 2015-04-03 NOTE — Telephone Encounter (Signed)
Patient s/p 5v CABG 12/05/2014 going through cardiac rehab today, SBP 150s pre-exercise and increase to 160s after exercise. Per nurse, his lopressor was recently decreased to  BID, he is on Accupri  daily and he is off HCTZ. He is also on amlodipine , I have instructed him to increase amlodipine to  daily.  Ramond Dial PA Pager: [phone]

## 2015-04-03 NOTE — Progress Notes (Signed)
Pt hypertensive at cardiac rehab.  Resting BP:  169/94 automatic, pt rested manual recheck:  158/94.  With exercise, 170/100 nustep and 160/110 walking.  Pt reports he started amlodipine  on 03/30/15.  Pt is not currently taking HCTZ per his PCP due to frequent urination and back pain that occurred many years ago.  PC to [facility], [place], who advised pt to increase amlodipine to  daily.  Per Jorge Ny to continue to hold HCTZ. Pt instructed to increase amlodipine to , continue to watch PO sodium intake and obtain home BP monitor, notifying office if BP consistently >150/90.  Understanding verbalized.

## 2015-04-06 ENCOUNTER — Telehealth: Payer: Self-pay | Admitting: *Deleted

## 2015-04-06 ENCOUNTER — Encounter (HOSPITAL_COMMUNITY): Payer: Medicare Other

## 2015-04-06 MED ORDER — AMLODIPINE BESYLATE 10 MG PO TABS: 10.0000 mg | ORAL_TABLET | Freq: Every day | ORAL | Status: DC

## 2015-04-06 NOTE — Telephone Encounter (Signed)
Called in increased amlodipine 10 mg po daily to the pts pharmacy of choice per Azalee Course PA.  This medication increase was endorsed to Deveron Furlong RN with Cardiac Rehab on 04/03/15.  Per Chyrl Civatte from Cardiac Rehab, she advised the pt to double up on his amlodipine 5 mg until our office could call in the 10 mg tabs.  This is for the pts BP during cardiac rehab.

## 2015-04-06 NOTE — Telephone Encounter (Signed)
-----   Message from Robyne Peers, RN sent at 04/06/2015  4:22 PM EDT ----- Regarding: cardiac rehab Dear Lajoyce Corners,  I spoke to Azalee Course, [place] on Friday 04/03/15 about Mr. Helwig's BP.  Hao asked him to increase amlodipine to  daily.  Can you send new prescription with this dosage to his pharmacy?  He will double up on his current pills until he runs out.    Thank you, Deveron Furlong, RN, BSN Cardiac Pulmonary Rehab

## 2015-04-08 ENCOUNTER — Encounter (HOSPITAL_COMMUNITY): Payer: Medicare Other

## 2015-04-10 ENCOUNTER — Encounter (HOSPITAL_COMMUNITY): Payer: Medicare Other

## 2015-04-13 ENCOUNTER — Encounter (HOSPITAL_COMMUNITY)
Admission: RE | Admit: 2015-04-13 | Discharge: 2015-04-13 | Disposition: A | Discharge status: Home / Self Care | Payer: Medicare Other | Source: Ambulatory Visit | Attending: Cardiology | Admitting: Cardiology

## 2015-04-13 DIAGNOSIS — I252 Old myocardial infarction: Secondary | ICD-10-CM | POA: Diagnosis not present

## 2015-04-13 DIAGNOSIS — Z48812 Encounter for surgical aftercare following surgery on the circulatory system: Secondary | ICD-10-CM | POA: Diagnosis not present

## 2015-04-13 DIAGNOSIS — Z951 Presence of aortocoronary bypass graft: Secondary | ICD-10-CM | POA: Diagnosis not present

## 2015-04-15 ENCOUNTER — Encounter (HOSPITAL_COMMUNITY)
Admission: RE | Admit: 2015-04-15 | Discharge: 2015-04-15 | Disposition: A | Discharge status: Home / Self Care | Payer: Medicare Other | Source: Ambulatory Visit | Attending: Cardiology | Admitting: Cardiology

## 2015-04-15 DIAGNOSIS — I252 Old myocardial infarction: Secondary | ICD-10-CM | POA: Diagnosis not present

## 2015-04-15 DIAGNOSIS — Z951 Presence of aortocoronary bypass graft: Secondary | ICD-10-CM | POA: Diagnosis not present

## 2015-04-15 DIAGNOSIS — Z48812 Encounter for surgical aftercare following surgery on the circulatory system: Secondary | ICD-10-CM | POA: Diagnosis not present

## 2015-04-17 ENCOUNTER — Encounter (HOSPITAL_COMMUNITY)
Admission: RE | Admit: 2015-04-17 | Discharge: 2015-04-17 | Disposition: A | Discharge status: Home / Self Care | Payer: Medicare Other | Source: Ambulatory Visit | Attending: Cardiology | Admitting: Cardiology

## 2015-04-17 DIAGNOSIS — Z951 Presence of aortocoronary bypass graft: Secondary | ICD-10-CM | POA: Diagnosis not present

## 2015-04-17 DIAGNOSIS — Z48812 Encounter for surgical aftercare following surgery on the circulatory system: Secondary | ICD-10-CM | POA: Diagnosis not present

## 2015-04-17 DIAGNOSIS — I252 Old myocardial infarction: Secondary | ICD-10-CM | POA: Diagnosis not present

## 2015-04-20 ENCOUNTER — Encounter (HOSPITAL_COMMUNITY)
Admission: RE | Admit: 2015-04-20 | Discharge: 2015-04-20 | Disposition: A | Discharge status: Home / Self Care | Payer: Medicare Other | Source: Ambulatory Visit | Attending: Cardiology | Admitting: Cardiology

## 2015-04-20 DIAGNOSIS — I252 Old myocardial infarction: Secondary | ICD-10-CM | POA: Diagnosis not present

## 2015-04-20 DIAGNOSIS — Z48812 Encounter for surgical aftercare following surgery on the circulatory system: Secondary | ICD-10-CM | POA: Insufficient documentation

## 2015-04-20 DIAGNOSIS — Z951 Presence of aortocoronary bypass graft: Secondary | ICD-10-CM | POA: Diagnosis not present

## 2015-04-22 ENCOUNTER — Encounter (HOSPITAL_COMMUNITY)
Admission: RE | Admit: 2015-04-22 | Discharge: 2015-04-22 | Disposition: A | Discharge status: Home / Self Care | Payer: Medicare Other | Source: Ambulatory Visit | Attending: Cardiology | Admitting: Cardiology

## 2015-04-22 DIAGNOSIS — Z48812 Encounter for surgical aftercare following surgery on the circulatory system: Secondary | ICD-10-CM | POA: Diagnosis not present

## 2015-04-22 DIAGNOSIS — Z951 Presence of aortocoronary bypass graft: Secondary | ICD-10-CM | POA: Diagnosis not present

## 2015-04-22 DIAGNOSIS — I252 Old myocardial infarction: Secondary | ICD-10-CM | POA: Diagnosis not present

## 2015-04-23 ENCOUNTER — Encounter: Payer: Self-pay | Admitting: Cardiology

## 2015-04-23 ENCOUNTER — Ambulatory Visit (INDEPENDENT_AMBULATORY_CARE_PROVIDER_SITE_OTHER): Payer: Medicare Other | Admitting: Cardiology

## 2015-04-23 VITALS — BP 138/90 | HR 54 | Ht 72.0 in | Wt 227.8 lb

## 2015-04-23 DIAGNOSIS — I2510 Atherosclerotic heart disease of native coronary artery without angina pectoris: Secondary | ICD-10-CM | POA: Diagnosis not present

## 2015-04-23 DIAGNOSIS — Z951 Presence of aortocoronary bypass graft: Secondary | ICD-10-CM

## 2015-04-23 DIAGNOSIS — I10 Essential (primary) hypertension: Secondary | ICD-10-CM

## 2015-04-23 DIAGNOSIS — E785 Hyperlipidemia, unspecified: Secondary | ICD-10-CM

## 2015-04-23 DIAGNOSIS — I214 Non-ST elevation (NSTEMI) myocardial infarction: Secondary | ICD-10-CM

## 2015-04-23 NOTE — Progress Notes (Signed)
Patient ID: [name], male   DOB: 1940-11-19, 74 y.o.   MRN: [id number]      Cardiology Office Note   Date:  04/23/2015   ID:  [name], DOB 1941/03/04, MRN [id number]  PCP:  Enrique Sack, MD  Cardiologist:  Dr. Tobias Alexander       History of Present Illness: [name] is a 74 y.o. male with a hx of HTN, HL, diabetes. Admitted 5/15-5/29 with a non-STEMI. Cardiac catheterization demonstrated severe 3 vessel CAD, mild segmental LV contraction with preserved overall LVEF. Patient was seen by Dr. Cornelius Moras and underwent CABG 12/05/14 (LIMA-LAD, SVG-PDA, SVG-OM1, SVG-OM2, SVG-DX). Postoperative course was complicated by atrial fibrillation. He converted to NSR on amiodarone and beta blocker. He continued to have recurrent episodes of atrial fibrillation. Therefore, he was also placed on Coumadin for anticoagulation.  He returns for FU.  Since discharge, he has been doing well. Chest soreness is improved. He denies significant dyspnea. He is NYHA 2-2b. He denies orthopnea, PND or significant pedal edema. He denies dizziness, near syncope or syncope. He denies fevers, cough, wheezing. He was seen by the PA at Dr. Orvan July office yesterday. Chest x-ray was performed.  04/23/2015 - 2 months follow up post CABG, he walks 2 miles daily, rode 80 miles on a bike, continues doing cardiac rehab, no CP or SOB, no palpitations or syncope, no LE edema, orthopnea. Off coumadine, was bradycardic at rehab, metoprolol decreased to 25 mg po BID and added amlodipine 10 mg po daily for hypertension. He feels great.  Studies/Reports Reviewed Today:  Carotid US 12/03/14 Bilateral ICA 1-39%  LHC 12/01/14 LM: 25% LAD: Ostial 50%, proximal to mid 99%, mid 80%, D1 75% LCx: Proximal 30%, OM1 40%, OM2 99% RCA: Mid 100%, left-right collaterals  Echo 11/30/14 EF 50-55%, inferior HK, grade 1 diastolic dysfunction Mild MR Mild LAE, mild RAE  Past Medical History  Diagnosis Date  .  Hypertension   . Diabetes mellitus without complication (HCC)   . Nephrolithiasis   . Hyperlipidemia   . 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 Rotator cuff repair Left   . Cardiac catheterization N/A 12/01/2014    Procedure: Left Heart Cath and Coronary Angiography;  Surgeon: Tonny Bollman, MD;  Location: [facility] INVASIVE CV LAB;  Service: Cardiovascular;  Laterality: N/A;  . Coronary artery bypass graft N/A 12/05/2014    Procedure: CORONARY ARTERY BYPASS GRAFTING (CABG), ON PUMP, TIMES FIVE, USING LEFT INTERNAL MAMMARY ARTERY, BILATERAL GREATER SAPHENOUS VEIN HARVESTED ENDOSCOPICALLY;  Surgeon: Purcell Nails, MD;  Location: [facility] OR;  Service: Open Heart Surgery;  Laterality: N/A;  LIMA-LAD; SEQ SVG-OM1-OM2; SVG-DIAG; SVG-PD  . Tee without cardioversion N/A 12/05/2014    Procedure: TRANSESOPHAGEAL ECHOCARDIOGRAM (TEE);  Surgeon: Purcell Nails, MD;  Location: [facility] OR;  Service: Open Heart Surgery;  Laterality: N/A;     Current Outpatient Prescriptions  Medication Sig Dispense Refill  . amLODipine (NORVASC) 10 MG tablet Take 20 mg by mouth daily.    [name] aspirin EC 81 MG tablet Take 81 mg by mouth daily.    [name] atorvastatin (LIPITOR) 80 MG tablet Take 1 tablet (80 mg total) by mouth daily at 6 PM. 30 tablet 1  . metFORMIN (GLUCOPHAGE) 500 MG tablet Take 250 mg by mouth daily with  breakfast.    . metoprolol (LOPRESSOR) 50 MG tablet Take 25 mg by mouth 2 (two) times daily.    . Omega-3 Fatty Acids (OMEGA 3 PO) Take 2 tablets by mouth daily.    [name] PRODIGY TWIST TOP LANCETS 28G MISC   0  . quinapril (ACCUPRIL) 40 MG tablet Take 40 mg by mouth daily.  0   No current facility-administered medications for this visit.    Allergies:   Review of patient's allergies indicates no known allergies.    Social History:  The patient  reports that he  has quit smoking. He does not have any smokeless tobacco history on file. He reports that he drinks about 1.2 oz of alcohol per week.   Family History:  The patient's family history includes Alzheimer's disease in his brother and father; Heart attack in his mother; Heart disease in his brother and mother.    ROS:   Please see the history of present illness.   Review of Systems  Constitution: Positive for decreased appetite.  All other systems reviewed and are negative.   PHYSICAL EXAM: VS:  BP 138/90 mmHg  Pulse 54  Ht 6' (1.829 m)  Wt 227 lb 12.8 oz (103.329 kg)  BMI 30.89 kg/m2    Wt Readings from Last 3 Encounters:  04/23/15 227 lb 12.8 oz (103.329 kg)  03/16/15 226 lb (102.513 kg)  03/09/15 226 lb (102.513 kg)     GEN: Well nourished, well developed, in no acute distress HEENT: normal Neck: no JVD,  no masses Cardiac:  Normal S1/S2, RRR; no murmur, no rubs or gallops, no edema  Respiratory:  clear to auscultation bilaterally, no wheezing, rhonchi or rales. GI: soft, nontender, nondistended, + BS MS: no deformity or atrophy Skin: warm and dry  Neuro:  CNs II-XII intact, Strength and sensation are intact Psych: Normal affect   EKG:  EKG is ordered today.  It demonstrates:   Sinus bradycardia, HR 52, first-degree AV block (PR-2 68 ms), LAD, IVCD   Recent Labs: 11/30/2014: TSH 1.816 12/06/2014: Magnesium 2.1 12/09/2014: Hemoglobin 9.4*; Platelets 187 12/11/2014: ALT 52; BUN 21*; Potassium 3.9; Sodium 136 12/13/2014: Creatinine, Ser 0.89    Lipid Panel    Component Value Date/Time   CHOL 156 11/30/2014 0713   TRIG 78 11/30/2014 0713   HDL 42 11/30/2014 0713   CHOLHDL 3.7 11/30/2014 0713   VLDL 16 11/30/2014 0713   LDLCALC 98 11/30/2014 0713    TTE: 11/30/2014 - Left ventricle: The cavity size was normal. Systolic function was normal. The estimated ejection fraction was in the range of 50% to 55%. Probable mild hypokinesis of the  mid-apicalinferior myocardium. Doppler parameters are consistent with abnormal left ventricular relaxation (grade 1 diastolic dysfunction). - Mitral valve: Calcified annulus. There was mild regurgitation. - Left atrium: The atrium was mildly dilated. - Right atrium: The atrium was mildly dilated.  ECG: Sinus bradycardia, 1. AVB, negative T waves in the lateral leads that are new     ASSESSMENT AND PLAN:  1. 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 Continue cardiac rehabilitation.  2. Paroxysmal atrial fibrillation:  Maintaining NSR. Normal LVEF, mildly dilated LA. Off amiodarone and coumadin, remains in SR. Follow up in 2 months. He is instructed to check his pulse for irregularity. Continue Metoprolol Tartrate to 50 mg Twice daily   3. Essential hypertension:  BP borderline, add HCTZ  25 mg po daily.  4. Hyperlipidemia:  Continue statin.    5. Diabetes mellitus type 2, controlled:  FU with PCP.   Follow up in 6 months with CMP, CBC, TSH and lipids prior to the visit.  Lars Masson, MD 04/23/2015 9:33 AM    [facility] [address] [place], [place], [place]  [address] Phone: [phone]; Fax: [phone]

## 2015-04-23 NOTE — Patient Instructions (Addendum)
Medication Instructions:   Your physician recommends that you continue on your current medications as directed. Please refer to the Current Medication list given to you today.    Labwork:  IN 6 MONTHS, PRIOR TO YOUR 6 MONTH FOLLOW-UP APPOINTMENT WITH DR NELSON---CHECK TSH, CMET, CBC W DIFF, AND LIPIDS--PLEASE COME FASTING TO THIS APPOINTMENT      Follow-Up:  Your physician wants you to follow-up in: 6 MONTHS WITH DR [name] will receive a reminder letter in the mail two months in advance. If you don't receive a letter, please call our office to schedule the follow-up appointment.  PLEASE HAVE YOUR LABS DONE PRIOR TO THIS OFFICE VISIT

## 2015-04-24 ENCOUNTER — Encounter (HOSPITAL_COMMUNITY)
Admission: RE | Admit: 2015-04-24 | Discharge: 2015-04-24 | Disposition: A | Discharge status: Home / Self Care | Payer: Medicare Other | Source: Ambulatory Visit | Attending: Cardiology | Admitting: Cardiology

## 2015-04-24 DIAGNOSIS — Z951 Presence of aortocoronary bypass graft: Secondary | ICD-10-CM | POA: Diagnosis not present

## 2015-04-24 DIAGNOSIS — Z48812 Encounter for surgical aftercare following surgery on the circulatory system: Secondary | ICD-10-CM | POA: Diagnosis not present

## 2015-04-24 DIAGNOSIS — I252 Old myocardial infarction: Secondary | ICD-10-CM | POA: Diagnosis not present

## 2015-04-27 ENCOUNTER — Encounter (HOSPITAL_COMMUNITY)
Admission: RE | Admit: 2015-04-27 | Discharge: 2015-04-27 | Disposition: A | Discharge status: Home / Self Care | Payer: Medicare Other | Source: Ambulatory Visit | Attending: Cardiology | Admitting: Cardiology

## 2015-04-27 DIAGNOSIS — I252 Old myocardial infarction: Secondary | ICD-10-CM | POA: Diagnosis not present

## 2015-04-27 DIAGNOSIS — Z951 Presence of aortocoronary bypass graft: Secondary | ICD-10-CM | POA: Diagnosis not present

## 2015-04-27 DIAGNOSIS — Z48812 Encounter for surgical aftercare following surgery on the circulatory system: Secondary | ICD-10-CM | POA: Diagnosis not present

## 2015-04-29 ENCOUNTER — Encounter (HOSPITAL_COMMUNITY)
Admission: RE | Admit: 2015-04-29 | Discharge: 2015-04-29 | Disposition: A | Discharge status: Home / Self Care | Payer: Medicare Other | Source: Ambulatory Visit | Attending: Cardiology | Admitting: Cardiology

## 2015-04-29 DIAGNOSIS — Z48812 Encounter for surgical aftercare following surgery on the circulatory system: Secondary | ICD-10-CM | POA: Diagnosis not present

## 2015-04-29 DIAGNOSIS — Z951 Presence of aortocoronary bypass graft: Secondary | ICD-10-CM | POA: Diagnosis not present

## 2015-04-29 DIAGNOSIS — I252 Old myocardial infarction: Secondary | ICD-10-CM | POA: Diagnosis not present

## 2015-04-29 NOTE — Progress Notes (Signed)
60 day psychosocial assessment  No psychosocial needs identfied, no interventions necessary.  Will continue to monitor.  Pt exercising at home walking 4 miles daily and cycling.

## 2015-05-01 ENCOUNTER — Encounter (HOSPITAL_COMMUNITY)
Admission: RE | Admit: 2015-05-01 | Discharge: 2015-05-01 | Disposition: A | Discharge status: Home / Self Care | Payer: Medicare Other | Source: Ambulatory Visit | Attending: Cardiology | Admitting: Cardiology

## 2015-05-01 DIAGNOSIS — Z951 Presence of aortocoronary bypass graft: Secondary | ICD-10-CM | POA: Diagnosis not present

## 2015-05-01 DIAGNOSIS — Z48812 Encounter for surgical aftercare following surgery on the circulatory system: Secondary | ICD-10-CM | POA: Diagnosis not present

## 2015-05-01 DIAGNOSIS — I252 Old myocardial infarction: Secondary | ICD-10-CM | POA: Diagnosis not present

## 2015-05-04 ENCOUNTER — Encounter (HOSPITAL_COMMUNITY)
Admission: RE | Admit: 2015-05-04 | Discharge: 2015-05-04 | Disposition: A | Discharge status: Home / Self Care | Payer: Medicare Other | Source: Ambulatory Visit | Attending: Cardiology | Admitting: Cardiology

## 2015-05-04 DIAGNOSIS — I252 Old myocardial infarction: Secondary | ICD-10-CM | POA: Diagnosis not present

## 2015-05-04 DIAGNOSIS — Z951 Presence of aortocoronary bypass graft: Secondary | ICD-10-CM | POA: Diagnosis not present

## 2015-05-04 DIAGNOSIS — Z48812 Encounter for surgical aftercare following surgery on the circulatory system: Secondary | ICD-10-CM | POA: Diagnosis not present

## 2015-05-06 ENCOUNTER — Encounter (HOSPITAL_COMMUNITY)
Admission: RE | Admit: 2015-05-06 | Discharge: 2015-05-06 | Disposition: A | Discharge status: Home / Self Care | Payer: Medicare Other | Source: Ambulatory Visit | Attending: Cardiology | Admitting: Cardiology

## 2015-05-06 DIAGNOSIS — I252 Old myocardial infarction: Secondary | ICD-10-CM | POA: Diagnosis not present

## 2015-05-06 DIAGNOSIS — Z48812 Encounter for surgical aftercare following surgery on the circulatory system: Secondary | ICD-10-CM | POA: Diagnosis not present

## 2015-05-06 DIAGNOSIS — Z951 Presence of aortocoronary bypass graft: Secondary | ICD-10-CM | POA: Diagnosis not present

## 2015-05-08 ENCOUNTER — Encounter (HOSPITAL_COMMUNITY)
Admission: RE | Admit: 2015-05-08 | Discharge: 2015-05-08 | Disposition: A | Discharge status: Home / Self Care | Payer: Medicare Other | Source: Ambulatory Visit | Attending: Cardiology | Admitting: Cardiology

## 2015-05-08 DIAGNOSIS — Z48812 Encounter for surgical aftercare following surgery on the circulatory system: Secondary | ICD-10-CM | POA: Diagnosis not present

## 2015-05-08 DIAGNOSIS — Z951 Presence of aortocoronary bypass graft: Secondary | ICD-10-CM | POA: Diagnosis not present

## 2015-05-08 DIAGNOSIS — I252 Old myocardial infarction: Secondary | ICD-10-CM | POA: Diagnosis not present

## 2015-05-11 ENCOUNTER — Encounter (HOSPITAL_COMMUNITY)
Admission: RE | Admit: 2015-05-11 | Discharge: 2015-05-11 | Disposition: A | Discharge status: Home / Self Care | Payer: Medicare Other | Source: Ambulatory Visit | Attending: Cardiology | Admitting: Cardiology

## 2015-05-11 DIAGNOSIS — I252 Old myocardial infarction: Secondary | ICD-10-CM | POA: Diagnosis not present

## 2015-05-11 DIAGNOSIS — Z48812 Encounter for surgical aftercare following surgery on the circulatory system: Secondary | ICD-10-CM | POA: Diagnosis not present

## 2015-05-11 DIAGNOSIS — Z951 Presence of aortocoronary bypass graft: Secondary | ICD-10-CM | POA: Diagnosis not present

## 2015-05-13 ENCOUNTER — Encounter (HOSPITAL_COMMUNITY)
Admission: RE | Admit: 2015-05-13 | Discharge: 2015-05-13 | Disposition: A | Discharge status: Home / Self Care | Payer: Medicare Other | Source: Ambulatory Visit | Attending: Cardiology | Admitting: Cardiology

## 2015-05-13 DIAGNOSIS — Z951 Presence of aortocoronary bypass graft: Secondary | ICD-10-CM | POA: Diagnosis not present

## 2015-05-13 DIAGNOSIS — I252 Old myocardial infarction: Secondary | ICD-10-CM | POA: Diagnosis not present

## 2015-05-13 DIAGNOSIS — Z48812 Encounter for surgical aftercare following surgery on the circulatory system: Secondary | ICD-10-CM | POA: Diagnosis not present

## 2015-05-15 ENCOUNTER — Encounter (HOSPITAL_COMMUNITY)
Admission: RE | Admit: 2015-05-15 | Discharge: 2015-05-15 | Disposition: A | Discharge status: Home / Self Care | Payer: Medicare Other | Source: Ambulatory Visit | Attending: Cardiology | Admitting: Cardiology

## 2015-05-15 DIAGNOSIS — Z48812 Encounter for surgical aftercare following surgery on the circulatory system: Secondary | ICD-10-CM | POA: Diagnosis not present

## 2015-05-15 DIAGNOSIS — I252 Old myocardial infarction: Secondary | ICD-10-CM | POA: Diagnosis not present

## 2015-05-15 DIAGNOSIS — Z951 Presence of aortocoronary bypass graft: Secondary | ICD-10-CM | POA: Diagnosis not present

## 2015-05-18 ENCOUNTER — Encounter (HOSPITAL_COMMUNITY)
Admission: RE | Admit: 2015-05-18 | Discharge: 2015-05-18 | Disposition: A | Discharge status: Home / Self Care | Payer: Medicare Other | Source: Ambulatory Visit | Attending: Cardiology | Admitting: Cardiology

## 2015-05-18 DIAGNOSIS — Z951 Presence of aortocoronary bypass graft: Secondary | ICD-10-CM | POA: Diagnosis not present

## 2015-05-18 DIAGNOSIS — Z48812 Encounter for surgical aftercare following surgery on the circulatory system: Secondary | ICD-10-CM | POA: Diagnosis not present

## 2015-05-18 DIAGNOSIS — I252 Old myocardial infarction: Secondary | ICD-10-CM | POA: Diagnosis not present

## 2015-05-20 ENCOUNTER — Encounter (HOSPITAL_COMMUNITY)
Admission: RE | Admit: 2015-05-20 | Discharge: 2015-05-20 | Disposition: A | Discharge status: Home / Self Care | Payer: Medicare Other | Source: Ambulatory Visit | Attending: Cardiology | Admitting: Cardiology

## 2015-05-20 DIAGNOSIS — Z48812 Encounter for surgical aftercare following surgery on the circulatory system: Secondary | ICD-10-CM | POA: Diagnosis not present

## 2015-05-20 DIAGNOSIS — I252 Old myocardial infarction: Secondary | ICD-10-CM | POA: Insufficient documentation

## 2015-05-20 DIAGNOSIS — Z951 Presence of aortocoronary bypass graft: Secondary | ICD-10-CM | POA: Diagnosis not present

## 2015-05-21 DIAGNOSIS — E1165 Type 2 diabetes mellitus with hyperglycemia: Secondary | ICD-10-CM | POA: Diagnosis not present

## 2015-05-21 DIAGNOSIS — D649 Anemia, unspecified: Secondary | ICD-10-CM | POA: Diagnosis not present

## 2015-05-21 DIAGNOSIS — I2510 Atherosclerotic heart disease of native coronary artery without angina pectoris: Secondary | ICD-10-CM | POA: Diagnosis not present

## 2015-05-21 DIAGNOSIS — D559 Anemia due to enzyme disorder, unspecified: Secondary | ICD-10-CM | POA: Diagnosis not present

## 2015-05-21 DIAGNOSIS — I10 Essential (primary) hypertension: Secondary | ICD-10-CM | POA: Diagnosis not present

## 2015-05-22 ENCOUNTER — Encounter (HOSPITAL_COMMUNITY)
Admission: RE | Admit: 2015-05-22 | Discharge: 2015-05-22 | Disposition: A | Discharge status: Home / Self Care | Payer: Medicare Other | Source: Ambulatory Visit | Attending: Cardiology | Admitting: Cardiology

## 2015-05-22 DIAGNOSIS — Z48812 Encounter for surgical aftercare following surgery on the circulatory system: Secondary | ICD-10-CM | POA: Diagnosis not present

## 2015-05-22 DIAGNOSIS — I252 Old myocardial infarction: Secondary | ICD-10-CM | POA: Diagnosis not present

## 2015-05-22 DIAGNOSIS — Z951 Presence of aortocoronary bypass graft: Secondary | ICD-10-CM | POA: Diagnosis not present

## 2015-05-25 ENCOUNTER — Encounter (HOSPITAL_COMMUNITY)
Admission: RE | Admit: 2015-05-25 | Discharge: 2015-05-25 | Disposition: A | Discharge status: Home / Self Care | Payer: Medicare Other | Source: Ambulatory Visit | Attending: Cardiology | Admitting: Cardiology

## 2015-05-25 DIAGNOSIS — I252 Old myocardial infarction: Secondary | ICD-10-CM | POA: Diagnosis not present

## 2015-05-25 DIAGNOSIS — Z48812 Encounter for surgical aftercare following surgery on the circulatory system: Secondary | ICD-10-CM | POA: Diagnosis not present

## 2015-05-25 DIAGNOSIS — Z951 Presence of aortocoronary bypass graft: Secondary | ICD-10-CM | POA: Diagnosis not present

## 2015-05-27 ENCOUNTER — Encounter (HOSPITAL_COMMUNITY)
Admission: RE | Admit: 2015-05-27 | Discharge: 2015-05-27 | Disposition: A | Discharge status: Home / Self Care | Payer: Medicare Other | Source: Ambulatory Visit | Attending: Cardiology | Admitting: Cardiology

## 2015-05-27 DIAGNOSIS — Z951 Presence of aortocoronary bypass graft: Secondary | ICD-10-CM | POA: Diagnosis not present

## 2015-05-27 DIAGNOSIS — Z48812 Encounter for surgical aftercare following surgery on the circulatory system: Secondary | ICD-10-CM | POA: Diagnosis not present

## 2015-05-27 DIAGNOSIS — I252 Old myocardial infarction: Secondary | ICD-10-CM | POA: Diagnosis not present

## 2015-05-29 ENCOUNTER — Encounter (HOSPITAL_COMMUNITY)
Admission: RE | Admit: 2015-05-29 | Discharge: 2015-05-29 | Disposition: A | Discharge status: Home / Self Care | Payer: Medicare Other | Source: Ambulatory Visit | Attending: Cardiology | Admitting: Cardiology

## 2015-05-29 DIAGNOSIS — I252 Old myocardial infarction: Secondary | ICD-10-CM | POA: Diagnosis not present

## 2015-05-29 DIAGNOSIS — Z48812 Encounter for surgical aftercare following surgery on the circulatory system: Secondary | ICD-10-CM | POA: Diagnosis not present

## 2015-05-29 DIAGNOSIS — Z951 Presence of aortocoronary bypass graft: Secondary | ICD-10-CM | POA: Diagnosis not present

## 2015-06-01 ENCOUNTER — Encounter (HOSPITAL_COMMUNITY)
Admission: RE | Admit: 2015-06-01 | Discharge: 2015-06-01 | Disposition: A | Discharge status: Home / Self Care | Payer: Medicare Other | Source: Ambulatory Visit | Attending: Cardiology | Admitting: Cardiology

## 2015-06-01 DIAGNOSIS — Z48812 Encounter for surgical aftercare following surgery on the circulatory system: Secondary | ICD-10-CM | POA: Diagnosis not present

## 2015-06-01 DIAGNOSIS — I252 Old myocardial infarction: Secondary | ICD-10-CM | POA: Diagnosis not present

## 2015-06-01 DIAGNOSIS — Z951 Presence of aortocoronary bypass graft: Secondary | ICD-10-CM | POA: Diagnosis not present

## 2015-06-01 NOTE — Progress Notes (Signed)
No psychosocial needs identified, no intervention necessary. Pt is exercising on his own at home.  Pt is walking 4 miles daily and cycling.  Will continue to monitor.

## 2015-06-03 ENCOUNTER — Encounter (HOSPITAL_COMMUNITY)
Admission: RE | Admit: 2015-06-03 | Discharge: 2015-06-03 | Disposition: A | Discharge status: Home / Self Care | Payer: Medicare Other | Source: Ambulatory Visit | Attending: Cardiology | Admitting: Cardiology

## 2015-06-03 DIAGNOSIS — I252 Old myocardial infarction: Secondary | ICD-10-CM | POA: Diagnosis not present

## 2015-06-03 DIAGNOSIS — Z48812 Encounter for surgical aftercare following surgery on the circulatory system: Secondary | ICD-10-CM | POA: Diagnosis not present

## 2015-06-03 DIAGNOSIS — Z951 Presence of aortocoronary bypass graft: Secondary | ICD-10-CM | POA: Diagnosis not present

## 2015-06-05 ENCOUNTER — Encounter (HOSPITAL_COMMUNITY)
Admission: RE | Admit: 2015-06-05 | Discharge: 2015-06-05 | Disposition: A | Discharge status: Home / Self Care | Payer: Medicare Other | Source: Ambulatory Visit | Attending: Cardiology | Admitting: Cardiology

## 2015-06-05 DIAGNOSIS — I252 Old myocardial infarction: Secondary | ICD-10-CM | POA: Diagnosis not present

## 2015-06-05 DIAGNOSIS — Z951 Presence of aortocoronary bypass graft: Secondary | ICD-10-CM | POA: Diagnosis not present

## 2015-06-05 DIAGNOSIS — Z48812 Encounter for surgical aftercare following surgery on the circulatory system: Secondary | ICD-10-CM | POA: Diagnosis not present

## 2015-06-08 ENCOUNTER — Encounter (HOSPITAL_COMMUNITY)
Admission: RE | Admit: 2015-06-08 | Discharge: 2015-06-08 | Disposition: A | Discharge status: Home / Self Care | Payer: Medicare Other | Source: Ambulatory Visit | Attending: Cardiology | Admitting: Cardiology

## 2015-06-08 DIAGNOSIS — I252 Old myocardial infarction: Secondary | ICD-10-CM | POA: Diagnosis not present

## 2015-06-08 DIAGNOSIS — Z48812 Encounter for surgical aftercare following surgery on the circulatory system: Secondary | ICD-10-CM | POA: Diagnosis not present

## 2015-06-08 DIAGNOSIS — Z951 Presence of aortocoronary bypass graft: Secondary | ICD-10-CM | POA: Diagnosis not present

## 2015-06-10 ENCOUNTER — Encounter (HOSPITAL_COMMUNITY)
Admission: RE | Admit: 2015-06-10 | Discharge: 2015-06-10 | Disposition: A | Discharge status: Home / Self Care | Payer: Medicare Other | Source: Ambulatory Visit | Attending: Cardiology | Admitting: Cardiology

## 2015-06-10 DIAGNOSIS — Z951 Presence of aortocoronary bypass graft: Secondary | ICD-10-CM | POA: Diagnosis not present

## 2015-06-10 DIAGNOSIS — Z48812 Encounter for surgical aftercare following surgery on the circulatory system: Secondary | ICD-10-CM | POA: Diagnosis not present

## 2015-06-10 DIAGNOSIS — I252 Old myocardial infarction: Secondary | ICD-10-CM | POA: Diagnosis not present

## 2015-06-12 ENCOUNTER — Encounter (HOSPITAL_COMMUNITY): Payer: Medicare Other

## 2015-06-15 ENCOUNTER — Encounter (HOSPITAL_COMMUNITY)
Admission: RE | Admit: 2015-06-15 | Discharge: 2015-06-15 | Disposition: A | Discharge status: Home / Self Care | Payer: Medicare Other | Source: Ambulatory Visit | Attending: Cardiology | Admitting: Cardiology

## 2015-06-15 DIAGNOSIS — Z951 Presence of aortocoronary bypass graft: Secondary | ICD-10-CM | POA: Diagnosis not present

## 2015-06-15 DIAGNOSIS — I252 Old myocardial infarction: Secondary | ICD-10-CM | POA: Diagnosis not present

## 2015-06-15 DIAGNOSIS — Z48812 Encounter for surgical aftercare following surgery on the circulatory system: Secondary | ICD-10-CM | POA: Diagnosis not present

## 2015-06-16 ENCOUNTER — Encounter (HOSPITAL_COMMUNITY): Payer: Self-pay

## 2015-06-16 NOTE — Progress Notes (Signed)
Pt graduated from cardiac rehab program today with completion of 36 exercise sessions in Phase II. Pt maintained good attendance and progressed nicely during his participation in rehab as evidenced by increased MET level.   Medication list reconciled. Repeat  PHQ score-0  .  Pt has made significant lifestyle changes and should be commended for his success. Pt feels he has achieved his goals during cardiac rehab.   Pt plans to continue exercising on his own bicycling, walking and going to local gym.

## 2015-07-02 ENCOUNTER — Encounter: Payer: Self-pay | Admitting: Cardiology

## 2015-07-30 ENCOUNTER — Other Ambulatory Visit: Payer: Self-pay

## 2015-08-26 ENCOUNTER — Encounter: Payer: Self-pay | Admitting: Cardiology

## 2015-09-28 DIAGNOSIS — D559 Anemia due to enzyme disorder, unspecified: Secondary | ICD-10-CM | POA: Diagnosis not present

## 2015-09-28 DIAGNOSIS — I2510 Atherosclerotic heart disease of native coronary artery without angina pectoris: Secondary | ICD-10-CM | POA: Diagnosis not present

## 2015-09-28 DIAGNOSIS — I10 Essential (primary) hypertension: Secondary | ICD-10-CM | POA: Diagnosis not present

## 2015-09-28 DIAGNOSIS — E1165 Type 2 diabetes mellitus with hyperglycemia: Secondary | ICD-10-CM | POA: Diagnosis not present

## 2015-09-28 DIAGNOSIS — E109 Type 1 diabetes mellitus without complications: Secondary | ICD-10-CM | POA: Diagnosis not present

## 2015-11-03 ENCOUNTER — Encounter: Payer: Self-pay | Admitting: Cardiology

## 2015-11-03 ENCOUNTER — Ambulatory Visit (INDEPENDENT_AMBULATORY_CARE_PROVIDER_SITE_OTHER): Payer: Medicare Other | Admitting: Cardiology

## 2015-11-03 ENCOUNTER — Other Ambulatory Visit: Payer: Medicare Other | Admitting: *Deleted

## 2015-11-03 VITALS — BP 132/82 | HR 47 | Ht 72.0 in | Wt 220.0 lb

## 2015-11-03 DIAGNOSIS — I10 Essential (primary) hypertension: Secondary | ICD-10-CM

## 2015-11-03 DIAGNOSIS — I2510 Atherosclerotic heart disease of native coronary artery without angina pectoris: Secondary | ICD-10-CM

## 2015-11-03 DIAGNOSIS — E785 Hyperlipidemia, unspecified: Secondary | ICD-10-CM

## 2015-11-03 DIAGNOSIS — Z951 Presence of aortocoronary bypass graft: Secondary | ICD-10-CM | POA: Diagnosis not present

## 2015-11-03 DIAGNOSIS — I2583 Coronary atherosclerosis due to lipid rich plaque: Secondary | ICD-10-CM

## 2015-11-03 NOTE — Progress Notes (Signed)
Patient ID: [name], male   DOB: 04/08/1941, 75 y.o.   MRN: [id number]     Cardiology Office Note   Date:  11/03/2015   ID:  [name], DOB 04/08/1941, MRN [id number]  PCP:  [name]  Cardiologist:  Dr. Tobias AlexanderKatarina Erving Clarke       History of Present Illness: [name] is a 75 y.o. male with a hx of HTN, HL, diabetes. Admitted 5/15-5/29 with a non-STEMI. Cardiac catheterization demonstrated severe 3 vessel CAD, mild segmental LV contraction with preserved overall LVEF. Patient was seen by [name] and underwent CABG 12/05/14 (LIMA-LAD, SVG-PDA, SVG-OM1, SVG-OM2, SVG-DX). Postoperative course was complicated by atrial fibrillation. He converted to NSR on amiodarone and beta blocker. He continued to have recurrent episodes of atrial fibrillation. Therefore, he was also placed on Coumadin for anticoagulation.  He returns for FU.  Since discharge, he has been doing well. Chest soreness is improved. He denies significant dyspnea. He is NYHA 2-2b. He denies orthopnea, PND or significant pedal edema. He denies dizziness, near syncope or syncope. He denies fevers, cough, wheezing. He was seen by the PA at [name]'s office yesterday. Chest x-ray was performed.  11/03/2015 - 6 months follow up, almost 1 year post CABG, he is doing great, exercising frequently, completed cardiac rehab. Denies chest pain, SOB, no LE edema, orthopnea, palpitations or syncope.   Studies/Reports Reviewed Today:  Carotid US 12/03/14 Bilateral ICA 1-39%  LHC 12/01/14 LM: 25% LAD: Ostial 50%, proximal to mid 99%, mid 80%, D1 75% LCx: Proximal 30%, OM1 40%, OM2 99% RCA: Mid 100%, left-right collaterals  Echo 11/30/14 EF 50-55%, inferior HK, grade 1 diastolic dysfunction Mild MR Mild LAE, mild RAE  Past Medical History  Diagnosis Date  . Hypertension   . Diabetes mellitus without complication (HCC)   . Nephrolithiasis   . Hyperlipidemia   . 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 Rotator cuff repair Left   . Cardiac catheterization N/A 12/01/2014    Procedure: Left Heart Cath and Coronary Angiography;  Surgeon: [name];  [facility]VAS[facility] CV LAB;  Service: Cardiovascular;  Laterality: N/A;  . Coronary artery bypass graft N/A 12/05/2014    Procedure: CORONARY ARTERY BYPASS GRAFTING (CABG), ON PUMP, TIMES FIVE, USING LEFT INTERNAL MAMMARY ARTERY, BILATERAL GREATER SAPHENOUS VEIN HARVESTED ENDOSCOPICALLY;  Surgeon: [name];  Location: [facility] OR;  Service: Open Heart Surgery;  Laterality: N/A;  LIMA-LAD; SEQ SVG-OM1-OM2; SVG-DIAG; SVG-PD  . Tee without cardioversion N/A 12/05/2014    Procedure: TRANSESOPHAGEAL ECHOCARDIOGRAM (TEE);  Surgeon: [name];  [facility];  Service: Open Heart Surgery;  Laterality: N/A;     Current Outpatient Prescriptions  Medication Sig Dispense Refill  . amLODipine (NORVASC) 10 MG tablet Take 1 tablet by mouth daily.  0  . aspirin EC 81 MG tablet Take 81 mg by mouth daily.    [name]. atorvastatin (LIPITOR) 80 MG tablet Take 1 tablet (80 mg total) by mouth daily at 6 PM. 30 tablet 1  . metFORMIN (GLUCOPHAGE) 500 MG tablet Take 250 mg by mouth daily with breakfast.    . metoprolol (LOPRESSOR) 50 MG tablet Take 1 tablet by mouth 2 (two) times daily.  1  . Omega-3 Fatty Acids (OMEGA 3 PO)  Take 2 tablets by mouth daily.    [name] PRODIGY TWIST TOP LANCETS 28G MISC   0  . quinapril (ACCUPRIL) 40 MG tablet Take 40 mg by mouth daily.  0   No current facility-administered medications for this visit.    Allergies:   Review of patient's allergies indicates no known allergies.    Social History:  The patient  reports that he has quit smoking. He does not have any smokeless tobacco history on file. He reports that he drinks about 1.2 oz of alcohol per week.    Family History:  The patient's family history includes Alzheimer's disease in his brother and father; Heart attack in his mother; Heart disease in his brother and mother.    ROS:   Please see the history of present illness.   Review of Systems  Constitution: Positive for decreased appetite.  All other systems reviewed and are negative.   PHYSICAL EXAM: VS:  BP 132/82 mmHg  Pulse 47  Ht 6' (1.829 m)  Wt 220 lb (99.791 kg)  BMI 29.83 kg/m2    Wt Readings from Last 3 Encounters:  11/03/15 220 lb (99.791 kg)  04/23/15 227 lb 12.8 oz (103.329 kg)  03/16/15 226 lb (102.513 kg)     GEN: Well nourished, well developed, in no acute distress HEENT: normal Neck: no JVD,  no masses Cardiac:  Normal S1/S2, RRR; no murmur, no rubs or gallops, no edema  Respiratory:  clear to auscultation bilaterally, no wheezing, rhonchi or rales. GI: soft, nontender, nondistended, + BS MS: no deformity or atrophy Skin: warm and dry  Neuro:  CNs II-XII intact, Strength and sensation are intact Psych: Normal affect  EKG:  EKG is ordered today.  It demonstrates:   Sinus bradycardia, HR 52, first-degree AV block (PR-2 68 ms), LAD, IVCD  Recent Labs: 11/30/2014: TSH 1.816 12/06/2014: Magnesium 2.1 12/09/2014: Hemoglobin 9.4*; Platelets 187 12/11/2014: ALT 52; BUN 21*; Potassium 3.9; Sodium 136 12/13/2014: Creatinine, Ser 0.89   Lipid Panel    Component Value Date/Time   CHOL 156 11/30/2014 0713   TRIG 78 11/30/2014 0713   HDL 42 11/30/2014 0713   CHOLHDL 3.7 11/30/2014 0713   VLDL 16 11/30/2014 0713   LDLCALC 98 11/30/2014 0713    TTE: 11/30/2014 - Left ventricle: The cavity size was normal. Systolic function was normal. The estimated ejection fraction was in the range of 50% to 55%. Probable mild hypokinesis of the mid-apicalinferior myocardium. Doppler parameters are consistent with abnormal left ventricular relaxation (grade 1 diastolic dysfunction). - Mitral valve: Calcified annulus.  There was mild regurgitation. - Left atrium: The atrium was mildly dilated. - Right atrium: The atrium was mildly dilated.  ECG: Sinus bradycardia, 1. AVB, inferior MI, age undetermined, unchanged from the ECG on 04/23/2015.   ASSESSMENT AND PLAN:  1. 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 ECG is unchanged from the last year.  2. Post CABG Paroxysmal atrial fibrillation:  Maintaining NSR. Normal LVEF, mildly dilated LA. Off amiodarone and coumadin, remains in SR.  3. Essential hypertension:  controlled  4. Hyperlipidemia:  Continue atorvastatin 80 mg po daily.    5. Diabetes mellitus type 2, controlled:  FU with PCP.   Follow up in 6 months.  Lars Masson, Clarke 11/03/2015 10:01 AM    [facility] [address] [place], [place], [place]  [address] Phone: [phone])  [phone]; Fax: [phone]

## 2015-11-03 NOTE — Patient Instructions (Signed)
Your physician recommends that you continue on your current medications as directed. Please refer to the Current Medication list given to you today.     Your physician wants you to follow-up in: 6 MONTHS WITH DR NELSON You will receive a reminder letter in the mail two months in advance. If you don't receive a letter, please call our office to schedule the follow-up appointment.  

## 2015-11-30 DIAGNOSIS — H903 Sensorineural hearing loss, bilateral: Secondary | ICD-10-CM | POA: Diagnosis not present

## 2015-11-30 DIAGNOSIS — H9312 Tinnitus, left ear: Secondary | ICD-10-CM | POA: Diagnosis not present

## 2015-12-21 ENCOUNTER — Ambulatory Visit: Payer: Medicare Other | Admitting: Thoracic Surgery (Cardiothoracic Vascular Surgery)

## 2015-12-22 ENCOUNTER — Encounter: Payer: Self-pay | Admitting: Thoracic Surgery (Cardiothoracic Vascular Surgery)

## 2015-12-22 ENCOUNTER — Ambulatory Visit (INDEPENDENT_AMBULATORY_CARE_PROVIDER_SITE_OTHER): Payer: Medicare Other | Admitting: Thoracic Surgery (Cardiothoracic Vascular Surgery)

## 2015-12-22 VITALS — BP 130/90 | HR 54 | Resp 20 | Ht 72.0 in | Wt 220.0 lb

## 2015-12-22 DIAGNOSIS — Z951 Presence of aortocoronary bypass graft: Secondary | ICD-10-CM | POA: Diagnosis not present

## 2015-12-22 NOTE — Progress Notes (Signed)
[address]       [place] [id number]             [phone]     CARDIOTHORACIC SURGERY OFFICE NOTE  Referring Provider is Tonny BollmanOOPER, MICHAEL, MD Primary Cardiologist is Tobias AlexanderNELSON, KATARINA, MD PCP is GREEN, Lorenda IshiharaEDWIN JAY, MD   HPI:  Patient is a 75 year old male with coronary artery disease, hypertension, hyperlipidemia, and type 2 diabetes mellitus who returns to the office for routine follow-up approximately one year status post coronary artery bypass grafting x5 on 12/05/2014 for severe three-vessel coronary artery disease status post acute non-ST segment elevation myocardial infarction. The patient's postoperative recovery was uncomplicated and he was last seen here in our office on 03/16/2015 at which time he was doing well.  Since then the patient has continued to do very well.  He exercises strenuously on a regular basis. In fact, he enjoys riding bicycles and rides more than 100 miles every week.  He denies any symptoms of exertional shortness of breath or chest discomfort. He feels much improved in comparison with how he felt prior to surgery one year ago. He reports nophysical limitations whatsoever. He continues to follow-up intermittently with Dr. Delton SeeNelson who saw him most recently in April. His blood pressure has been under good control.He has been doing well with diabetes management and his most recent hemoglobin A1c was reported 6.7.  Overall he is very pleased with how well he is doing He has no complaints.   Current Outpatient Prescriptions  Medication Sig Dispense Refill  . amLODipine (NORVASC) 10 MG tablet Take 1 tablet by mouth daily.  0  . aspirin EC 81 MG tablet Take 81 mg by mouth daily.    [name]. atorvastatin (LIPITOR) 80 MG tablet Take 1 tablet (80 mg total) by mouth daily at 6 PM. 30 tablet 1  . metFORMIN (GLUCOPHAGE) 500 MG tablet Take 250 mg by mouth daily with breakfast.    . metoprolol tartrate (LOPRESSOR) 25 MG tablet Take 25 mg by mouth 2 (two) times  daily.    . Omega-3 Fatty Acids (OMEGA 3 PO) Take 2 tablets by mouth daily.    [name]. PRODIGY TWIST TOP LANCETS 28G MISC   0  . quinapril (ACCUPRIL) 40 MG tablet Take 40 mg by mouth daily.  0   No current facility-administered medications for this visit.      Physical Exam:   BP 130/90 mmHg  Pulse 54  Resp 20  Ht 6' (1.829 m)  Wt 220 lb (99.791 kg)  BMI 29.83 kg/m2  SpO2 98%  General:  Well appearing  Chest:   Clear to auscultation  CV:   Regular rate and rhythm  Incisions:  Completely healed, sternum is stable  Abdomen:  Soft and nontender  Extremities:  Warm and well perfused  Diagnostic Tests:  n/a   Impression:  Patient is doing very well approximately one year status post coronary artery bypass grafting.  Plan:  The patient will continue to follow up intermittently with Dr. Delton SeeNelson and Dr. Chilton SiGreen. In the future he will call and return to see us as needed. He has been reminded regarding the long-term benefits of tight glycemic control in the setting of type 2 diabetes mellitus with known ischemic heart disease.  The important contributions of regular exercise, a heart healthy diet,and attention to lipid management has been stressed. All of his questions have been addressed.  I spent in excess of 15 minutes during the conduct of this  office consultation and >50% of this time involved direct face-to-face encounter with the patient for counseling and/or coordination of their care.   Salvatore Decent. Cornelius Moras, MD 12/22/2015 10:07 AM

## 2015-12-22 NOTE — Patient Instructions (Signed)
You may resume normal physical activity without any particular limitations at this time, and return to our office only as needed should any further problems or questions arise.  Make every effort to keep your diabetes under very tight control.  Follow up closely with your primary care physician or endocrinologist and strive to keep their hemoglobin A1c levels as low as possible, preferably near or below 6.0.  The long term benefits of strict control of diabetes are far reaching and critically important for your overall health and survival.  Make every effort to maintain a "heart-healthy" lifestyle with regular physical exercise and adherence to a low-fat, low-carbohydrate diet.  Continue to seek regular follow-up appointments with your primary care physician and/or cardiologist.

## 2016-01-01 DIAGNOSIS — H353130 Nonexudative age-related macular degeneration, bilateral, stage unspecified: Secondary | ICD-10-CM | POA: Diagnosis not present

## 2016-01-20 ENCOUNTER — Other Ambulatory Visit: Payer: Self-pay | Admitting: *Deleted

## 2016-01-20 MED ORDER — METOPROLOL TARTRATE 25 MG PO TABS: 25.0000 mg | ORAL_TABLET | Freq: Two times a day (BID) | ORAL | Status: DC

## 2016-02-09 IMAGING — CR DG CHEST 2V
2 series · 2 of 2 positions shown · non-contrast
Comparison: December 08, 2014

CLINICAL DATA: Ongoing weakness following coronary artery bypass
grafting 3 weeks prior

EXAM:
CHEST  2 VIEW

[w chest pa]
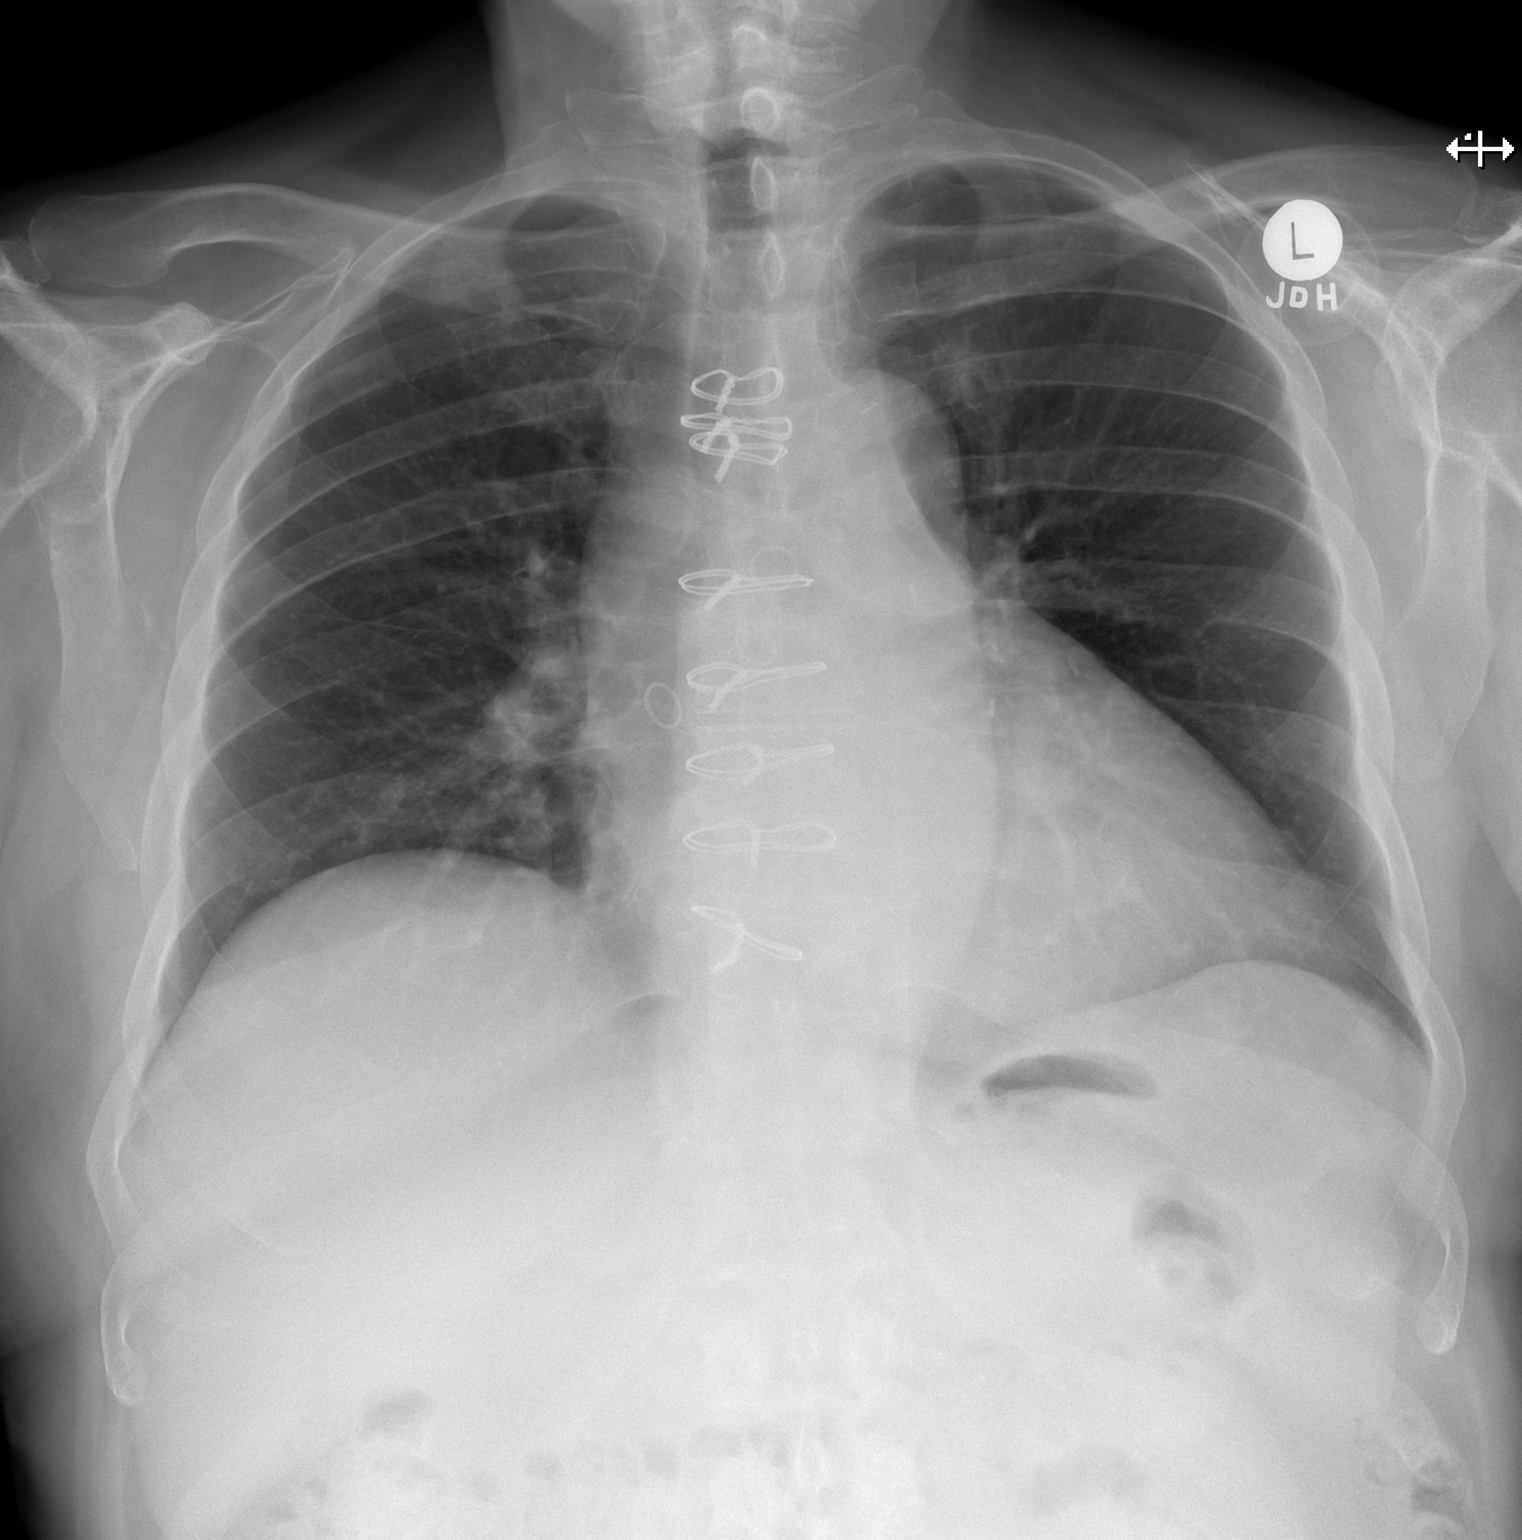

[w chest lat]
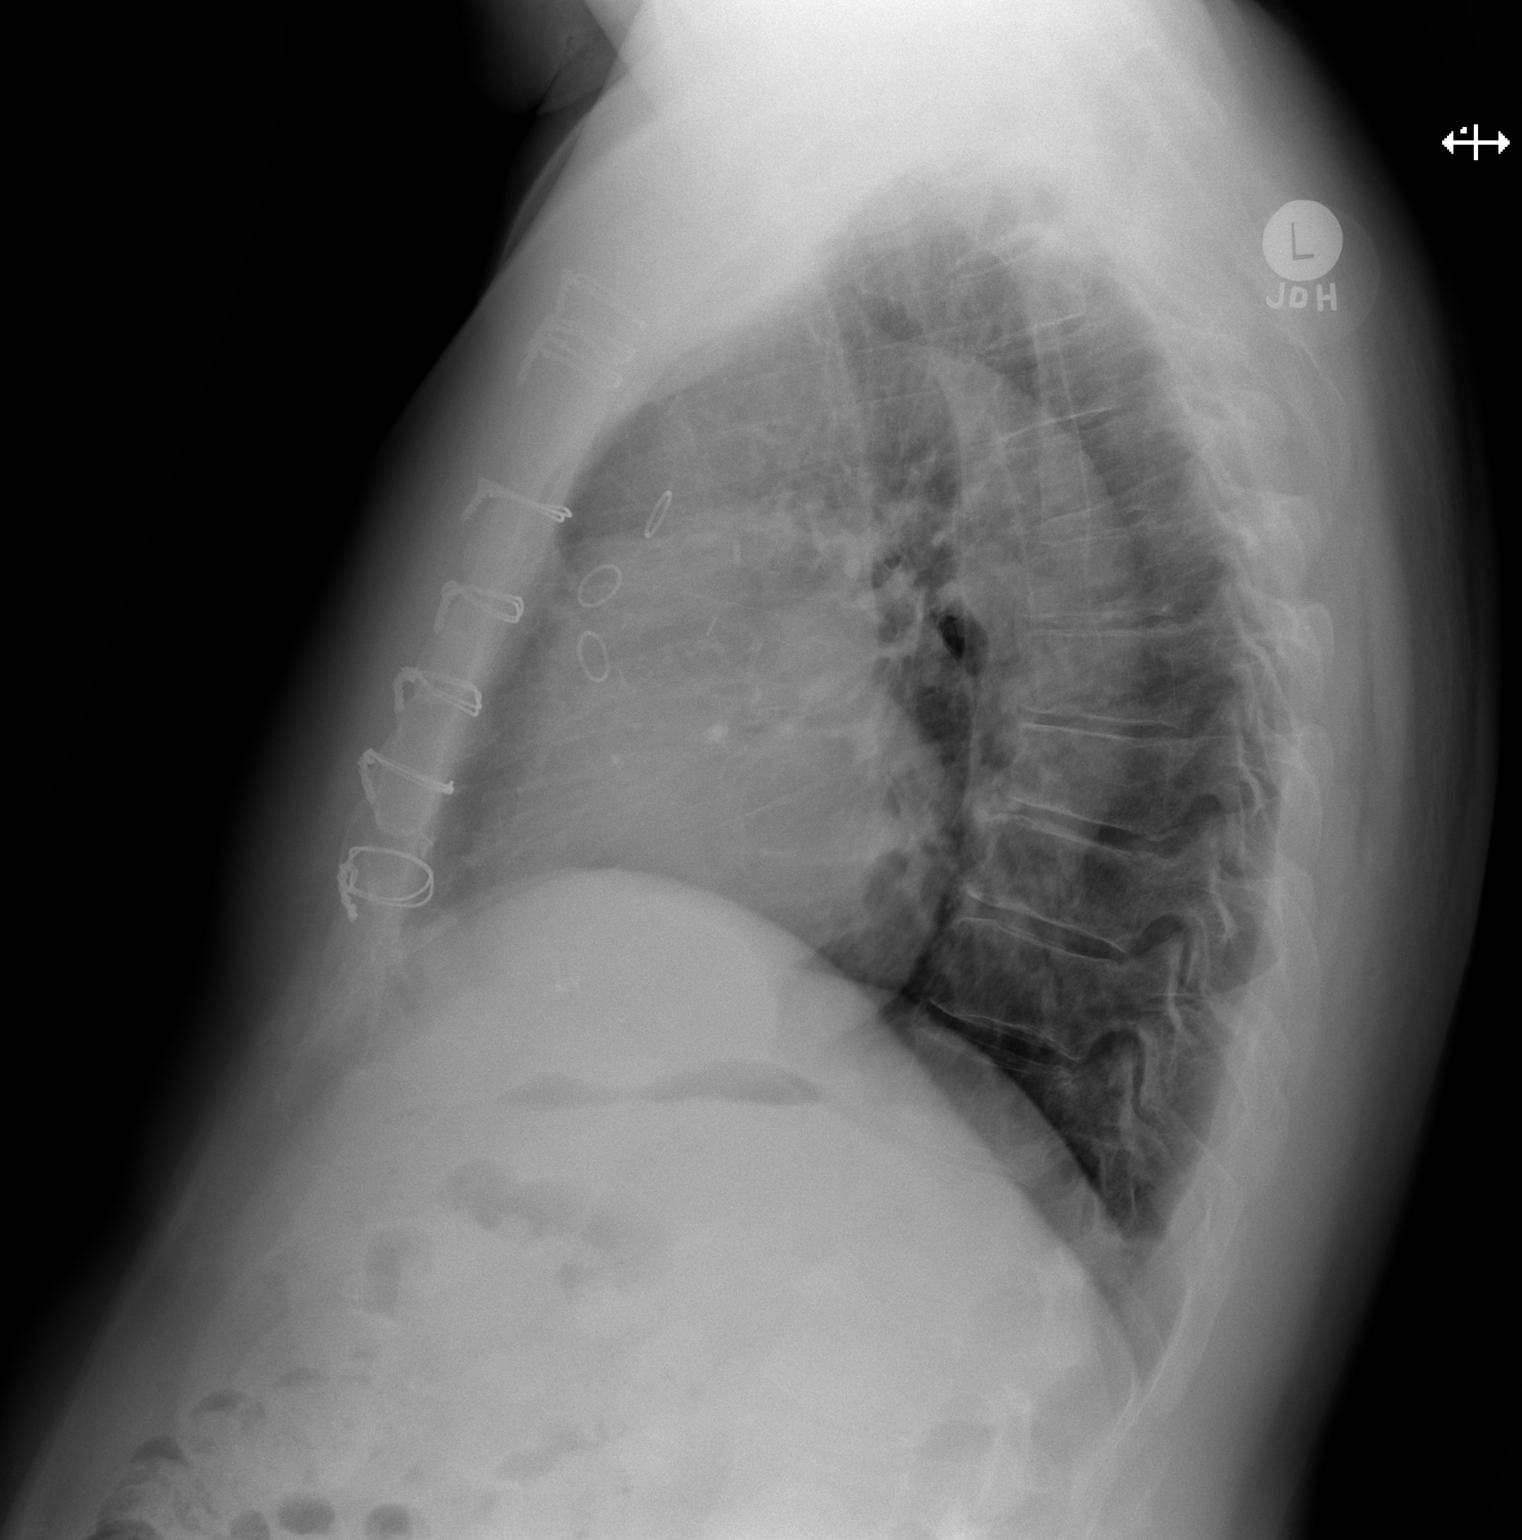

[2 of 2 positions shown; findings below may reference images not displayed]

FINDINGS: There is no edema or consolidation. There is a 1.5 x 1.2 cm nodular
opacity overlying the left heart, seen only on the frontal view.
Heart is upper normal in size with pulmonary vascularity within
normal limits. No adenopathy. Patient is status post coronary artery
bypass grafting. No pneumothorax.
IMPRESSION: No edema or consolidation. There is a 1.5 x 1.2 cm nodular opacity
overlying the left heart border seen on frontal view. This opacity
is somewhat better seen compared to recent prior studies, likely due
to a slightly deeper degree of inspiration at this time. This
opacity may well represent overlapping of vascular structures. As it
is vaguely seen on prior studies as well as on the current study, it
would be prudent to consider noncontrast enhanced chest CT for
further assessment to exclude the possibility of an underlying
pulmonary nodular lesion in this area.

These results will be called to the ordering clinician or
representative by the Radiologist Assistant, and communication
documented in the PACS or zVision Dashboard.

## 2016-03-09 DIAGNOSIS — E1165 Type 2 diabetes mellitus with hyperglycemia: Secondary | ICD-10-CM | POA: Diagnosis not present

## 2016-03-09 DIAGNOSIS — E785 Hyperlipidemia, unspecified: Secondary | ICD-10-CM | POA: Diagnosis not present

## 2016-03-09 DIAGNOSIS — I2510 Atherosclerotic heart disease of native coronary artery without angina pectoris: Secondary | ICD-10-CM | POA: Diagnosis not present

## 2016-03-09 DIAGNOSIS — Z0000 Encounter for general adult medical examination without abnormal findings: Secondary | ICD-10-CM | POA: Diagnosis not present

## 2016-03-09 DIAGNOSIS — R7309 Other abnormal glucose: Secondary | ICD-10-CM | POA: Diagnosis not present

## 2016-03-09 DIAGNOSIS — Z125 Encounter for screening for malignant neoplasm of prostate: Secondary | ICD-10-CM | POA: Diagnosis not present

## 2016-04-20 ENCOUNTER — Encounter: Payer: Self-pay | Admitting: Cardiology

## 2016-04-29 IMAGING — CT CT CHEST LIMITED W/O CM
2 of 3 series · 12 of 29 positions shown, 15 images · non-contrast
Comparison: Chest x-ray 12/29/2014

CLINICAL DATA: Question lung nodule left retrocardiac region on
chest x-ray 12/29/2014

EXAM:
CT CHEST WITHOUT CONTRAST
TECHNIQUE: Multidetector CT imaging of the chest was performed following the
standard protocol without IV contrast. Note that per the requesting
physician (Dr. Omko), only a limited chest CT was performed through
the area in question over the mid to lower lungs.

[Series 3: chest w/(date) · axial · 0.81mm/px · z∈[-278,-198]mm · 9 of 21 slices shown, 12 images]
[im 3/21  mediastinal]
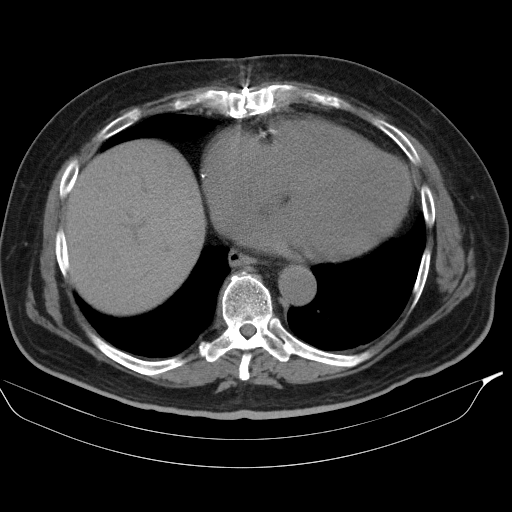
[im 3/21  lung]
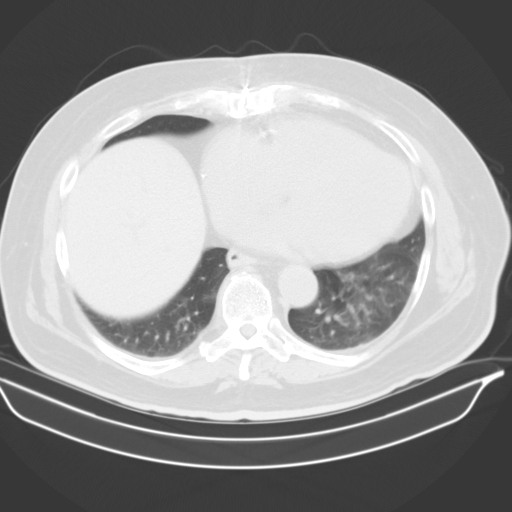
[im 5/21  lung]
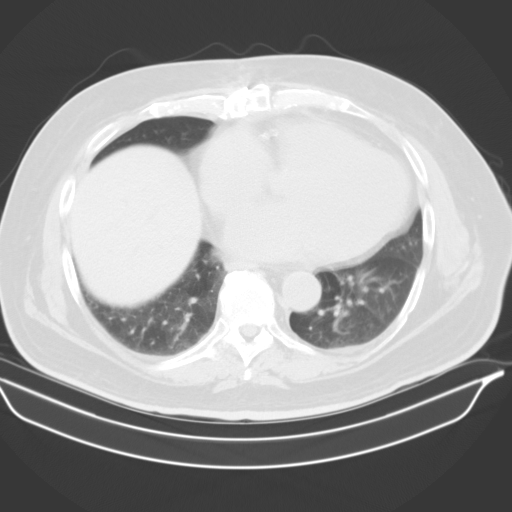
[im 7/21  lung]
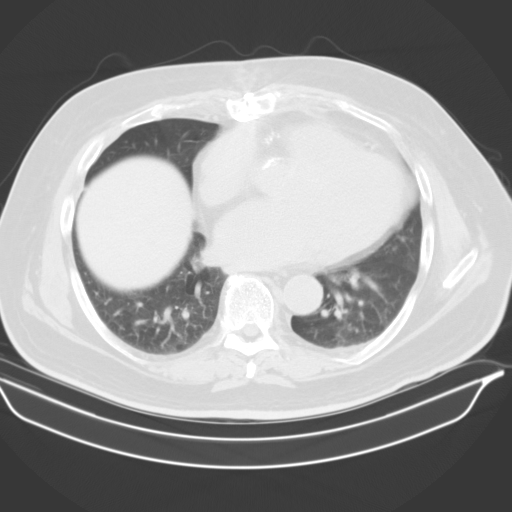
[im 9/21  lung]
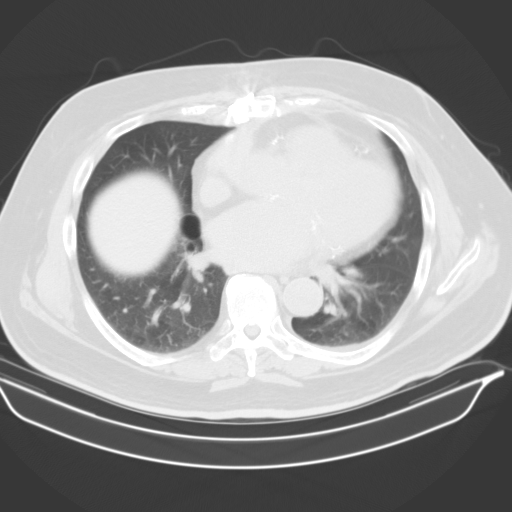
[im 11/21  mediastinal]
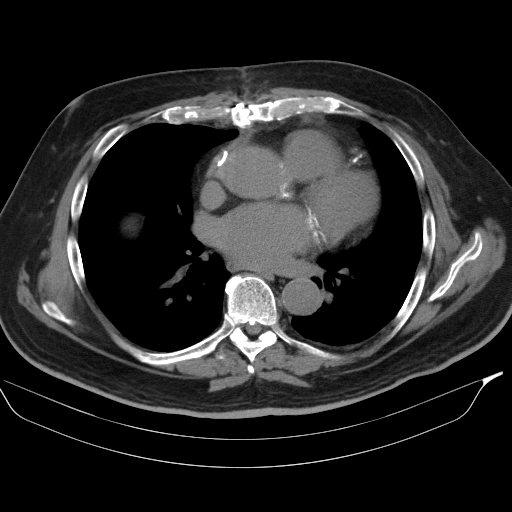
[im 11/21  lung]
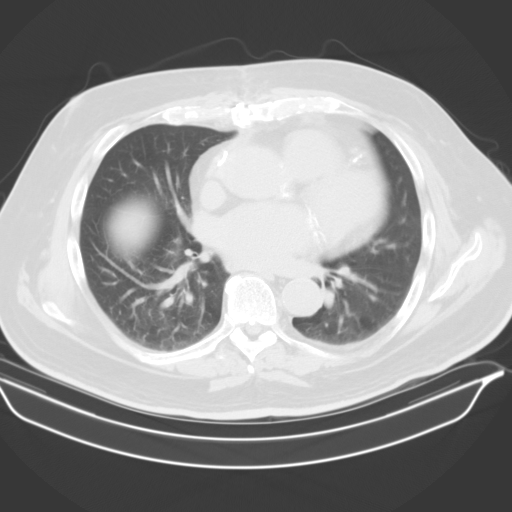
[im 13/21  lung]
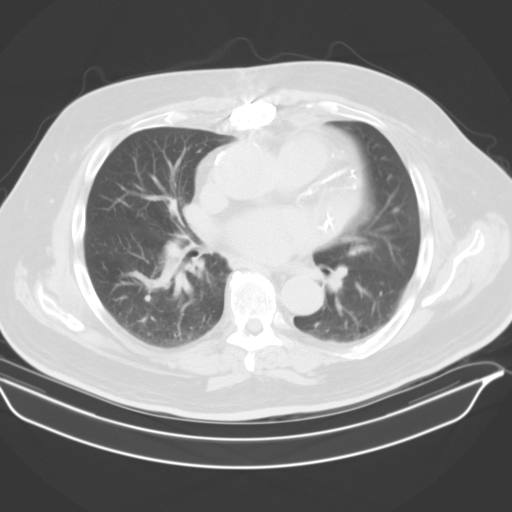
[im 15/21  lung]
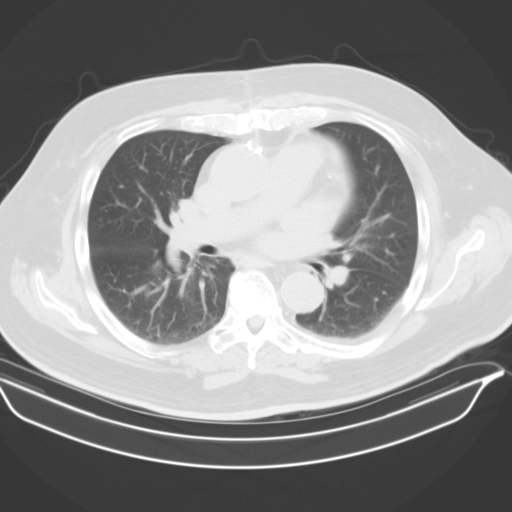
[im 17/21  lung]
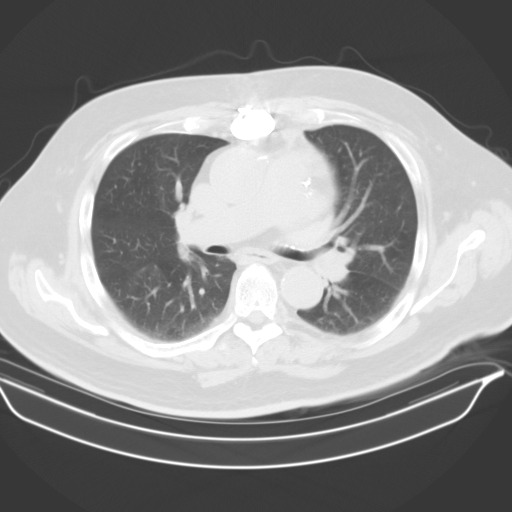
[im 19/21  mediastinal]
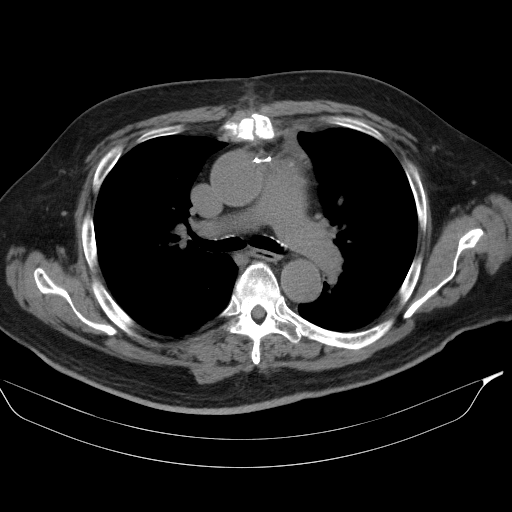
[im 19/21  lung]
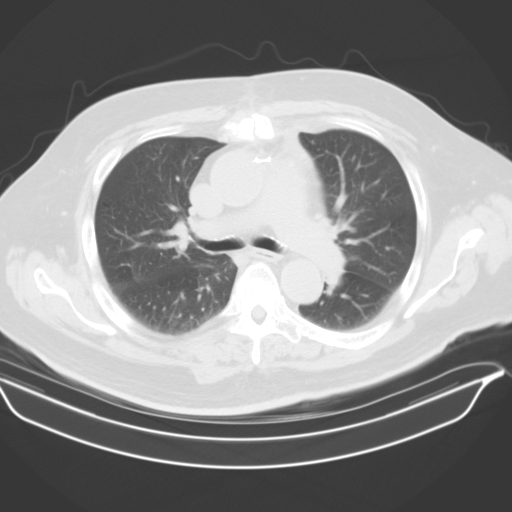

[Series 4: cor · coronal · 0.33mm/px · 3 of 98 slices shown]
[im 20/98  lung]
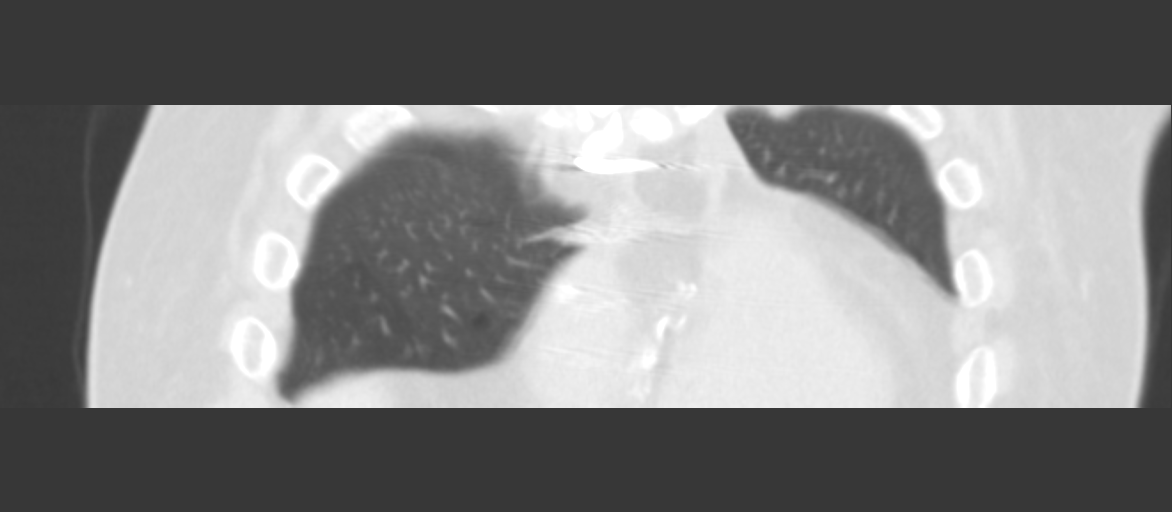
[im 39/98  lung]
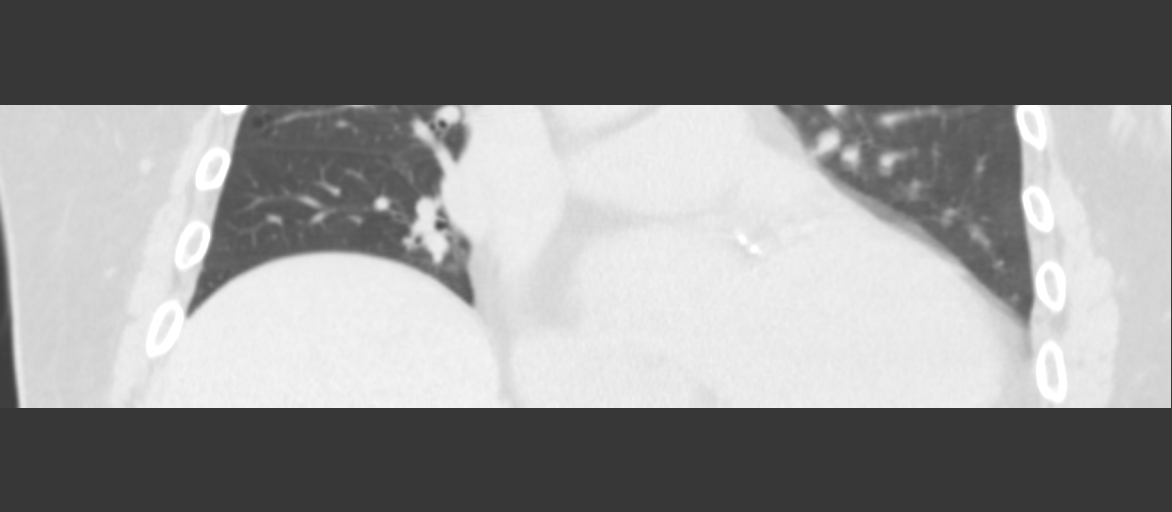
[im 59/98  lung]
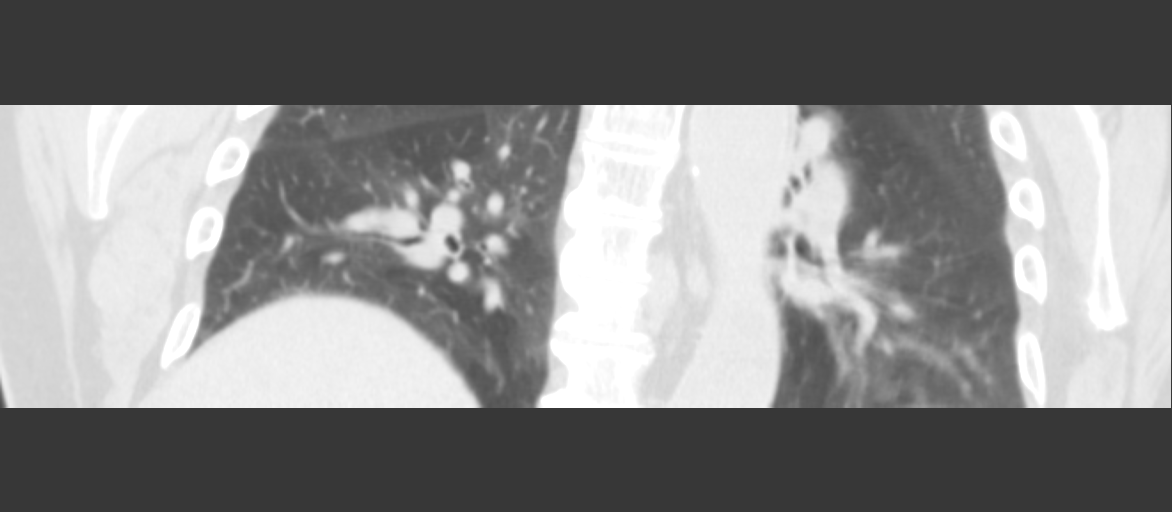

[12 of 29 positions shown; findings below may reference images not displayed]

FINDINGS: The visualized portions of the lungs are within normal without
consolidation or effusion. There is no focal pulmonary nodule or
mass. The density seen on the chest x-ray in the retrocardiac region
is likely due to normal pulmonary vascularity very visualized
airways are normal.

There is mild cardiomegaly. Sternotomy wires are present. Mild
aneurysmal dilatation of the ascending thoracic aorta 4.6 cm in AP
diameter. Remaining visualized mediastinal structures are within
normal. There are mild degenerate changes of the spine. Visualized
portions of the upper abdomen are within normal.
IMPRESSION: No pulmonary nodule/mass in the left mid to lower lung.

Mild cardiomegaly.

Aneurysmal dilatation of the ascending thoracic aorta measuring
cm in AP diameter. Ascending thoracic aortic aneurysm. Recommend
semi-annual imaging followup by CTA or MRA and referral to
cardiothoracic surgery if not already obtained. This recommendation
follows 0909 ACCF/AHA/AATS/ACR/ASA/SCA/SANIMBA/HOSAJ/MIGDALIA/NOMASIBULELE Guidelines
for the Diagnosis and Management of Patients With Thoracic Aortic
Disease. Circulation. 0909; 121: e266-e369.

## 2016-05-04 ENCOUNTER — Ambulatory Visit (INDEPENDENT_AMBULATORY_CARE_PROVIDER_SITE_OTHER): Payer: Medicare Other | Admitting: Cardiology

## 2016-05-04 ENCOUNTER — Encounter: Payer: Self-pay | Admitting: Cardiology

## 2016-05-04 ENCOUNTER — Encounter (INDEPENDENT_AMBULATORY_CARE_PROVIDER_SITE_OTHER): Payer: Self-pay

## 2016-05-04 ENCOUNTER — Other Ambulatory Visit: Payer: Self-pay | Admitting: Physician Assistant

## 2016-05-04 VITALS — BP 142/94 | HR 64 | Ht 72.0 in | Wt 234.4 lb

## 2016-05-04 DIAGNOSIS — I2510 Atherosclerotic heart disease of native coronary artery without angina pectoris: Secondary | ICD-10-CM

## 2016-05-04 DIAGNOSIS — I214 Non-ST elevation (NSTEMI) myocardial infarction: Secondary | ICD-10-CM | POA: Diagnosis not present

## 2016-05-04 DIAGNOSIS — E784 Other hyperlipidemia: Secondary | ICD-10-CM

## 2016-05-04 DIAGNOSIS — Z951 Presence of aortocoronary bypass graft: Secondary | ICD-10-CM | POA: Diagnosis not present

## 2016-05-04 DIAGNOSIS — I10 Essential (primary) hypertension: Secondary | ICD-10-CM

## 2016-05-04 DIAGNOSIS — E7849 Other hyperlipidemia: Secondary | ICD-10-CM

## 2016-05-04 DIAGNOSIS — I480 Paroxysmal atrial fibrillation: Secondary | ICD-10-CM

## 2016-05-04 MED ORDER — AMLODIPINE BESYLATE 10 MG PO TABS: 10.0000 mg | ORAL_TABLET | Freq: Every day | ORAL | 3 refills | Status: AC

## 2016-05-04 NOTE — Telephone Encounter (Signed)
amLODipine (NORVASC) 10 MG tablet  Medication  Date: 05/04/2016 [facility] [address] [facility] Ordering/Authorizing: Lars MassonKatarina H Nelson, MD  Order Providers   Prescribing Provider Encounter Provider  Lars MassonKatarina H Nelson, MD Lars MassonKatarina H Nelson, MD  Medication Detail    Disp Refills Start End   amLODipine (NORVASC) 10 MG tablet 90 tablet 3 05/04/2016    Sig - Route: Take 1 tablet (10 mg total) by mouth daily. - Oral   E-Prescribing Status: Receipt confirmed by pharmacy (05/04/2016 11:29 AM EDT)   Associated Diagnoses   Other hyperlipidemia - Primary     Pharmacy   [id number] - [place], [place] - [address]

## 2016-05-04 NOTE — Telephone Encounter (Signed)
Review for refill. 

## 2016-05-04 NOTE — Progress Notes (Signed)
Patient ID: [name], male   DOB: [birth year], 75 y.o.   MRN: [id number]     Cardiology Office Note  Date:  05/04/2016   ID:  [name], DOB [birth year], MRN [id number]  PCP:  Enrique SackGREEN, [name], MD  Cardiologist:  Dr. Tobias AlexanderKatarina Jamee Keach     History of Present Illness: [name] is a 75 y.o. male with a hx of HTN, HL, diabetes. Admitted 5/15-5/29 with a non-STEMI. Cardiac catheterization demonstrated severe 3 vessel CAD, mild segmental LV contraction with preserved overall LVEF. Patient was seen by Dr. Cornelius Moraswen and underwent CABG 12/05/14 (LIMA-LAD, SVG-PDA, SVG-OM1, SVG-OM2, SVG-DX). Postoperative course was complicated by atrial fibrillation. He converted to NSR on amiodarone and beta blocker. He continued to have recurrent episodes of atrial fibrillation. Therefore, he was also placed on Coumadin for anticoagulation.  He returns for FU.  Since discharge, he has been doing well. Chest soreness is improved. He denies significant dyspnea. He is NYHA 2-2b. He denies orthopnea, PND or significant pedal edema. He denies dizziness, near syncope or syncope. He denies fevers, cough, wheezing. He was seen by the PA at Dr. Orvan Julywen's office yesterday. Chest x-ray was performed.  05/04/16 - the patient is coming after 6 weeks, he remains very active and feels great, no chest pain or SOB, he is tolerating his meds and is complaint. No dizziness, palpitations, no LE edema. 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  Studies/Reports Reviewed Today:  Carotid US 12/03/14 Bilateral ICA 1-39%  LHC 12/01/14 LM: 25% LAD: Ostial 50%, proximal to mid 99%, mid 80%, D1 75% LCx: Proximal 30%, OM1 40%, OM2 99% RCA: Mid 100%, left-right collaterals  Echo 11/30/14 EF 50-55%, inferior HK, grade 1 diastolic dysfunction Mild MR Mild LAE, mild RAE  Past Medical History:  Diagnosis Date  . Diabetes mellitus without complication (HCC)   .  Hyperlipidemia   . Hypertension   . Nephrolithiasis   . 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 CARDIAC CATHETERIZATION N/A 12/01/2014   Procedure: Left Heart Cath and Coronary Angiography;  Surgeon: Tonny BollmanMichael Cooper, MD;  [facility];  Service: Cardiovascular;  Laterality: N/A;  . CORONARY ARTERY BYPASS GRAFT N/A 12/05/2014   Procedure: CORONARY ARTERY BYPASS GRAFTING (CABG), ON PUMP, TIMES FIVE, USING LEFT INTERNAL MAMMARY ARTERY, BILATERAL GREATER SAPHENOUS VEIN HARVESTED ENDOSCOPICALLY;  Surgeon: Purcell Nailslarence H Owen, MD;  Location: [facility] OR;  Service: Open Heart Surgery;  Laterality: N/A;  LIMA-LAD; SEQ SVG-OM1-OM2; SVG-DIAG; SVG-PD  . ROTATOR CUFF REPAIR Left   . TEE WITHOUT CARDIOVERSION N/A 12/05/2014   Procedure: TRANSESOPHAGEAL ECHOCARDIOGRAM (TEE);  Surgeon: Purcell Nailslarence H Owen, MD;  [facility];  Service: Open Heart Surgery;  Laterality: N/A;     Current Outpatient Prescriptions  Medication Sig Dispense Refill  . amLODipine (NORVASC) 10 MG tablet Take 1 tablet by mouth daily.  0  . aspirin EC 81 MG tablet Take 81 mg by mouth daily.    [name]. atorvastatin (LIPITOR) 80 MG tablet Take 1 tablet (80 mg total) by mouth daily at 6 PM. 30 tablet 1  . metFORMIN (GLUCOPHAGE) 500 MG tablet Take 250 mg by mouth daily with breakfast.    . metoprolol tartrate (LOPRESSOR) 25 MG tablet Take 1  tablet (25 mg total) by mouth 2 (two) times daily. 180 tablet 2  . Omega-3 Fatty Acids (OMEGA 3 PO) Take 2 tablets by mouth daily.    [name] PRODIGY TWIST TOP LANCETS 28G MISC   0  . quinapril (ACCUPRIL) 40 MG tablet Take 40 mg by mouth daily.  0   No current facility-administered medications for this visit.     Allergies:   Review of patient's allergies indicates no known allergies.    Social History:  The patient  reports that he has quit  smoking. He has never used smokeless tobacco. He reports that he drinks about 1.2 oz of alcohol per week . He reports that he does not use drugs.   Family History:  The patient's family history includes Alzheimer's disease in his brother and father; Heart attack in his mother; Heart disease in his brother and mother.    ROS:   Please see the history of present illness.   Review of Systems  Constitution: Positive for decreased appetite.  All other systems reviewed and are negative.   PHYSICAL EXAM: VS:  BP (!) 142/94   Pulse 64   Ht 6' (1.829 m)   Wt 234 lb 6.4 oz (106.3 kg)   SpO2 98%   BMI 31.79 kg/m     Wt Readings from Last 3 Encounters:  05/04/16 234 lb 6.4 oz (106.3 kg)  12/22/15 220 lb (99.8 kg)  11/03/15 220 lb (99.8 kg)     GEN: Well nourished, well developed, in no acute distress  HEENT: normal  Neck: no JVD,  no masses Cardiac:  Normal S1/S2, RRR; no murmur, no rubs or gallops, no edema  Respiratory:  clear to auscultation bilaterally, no wheezing, rhonchi or rales. GI: soft, nontender, nondistended, + BS MS: no deformity or atrophy  Skin: warm and dry  Neuro:  CNs II-XII intact, Strength and sensation are intact Psych: Normal affect  EKG:  EKG is ordered today.  It demonstrates:   Sinus bradycardia, HR 52, first-degree AV block (PR-2 68 ms), LAD, IVCD  Recent Labs: No results found for requested labs within last 8760 hours.   Lipid Panel    Component Value Date/Time   CHOL 156 11/30/2014 0713   TRIG 78 11/30/2014 0713   HDL 42 11/30/2014 0713   CHOLHDL 3.7 11/30/2014 0713   VLDL 16 11/30/2014 0713   LDLCALC 98 11/30/2014 0713    TTE: 11/30/2014 - Left ventricle: The cavity size was normal. Systolic function was normal. The estimated ejection fraction was in the range of 50% to 55%. Probable mild hypokinesis of the mid-apicalinferior myocardium. Doppler parameters are consistent with abnormal left ventricular relaxation (grade 1 diastolic  dysfunction). - Mitral valve: Calcified annulus. There was mild regurgitation. - Left atrium: The atrium was mildly dilated. - Right atrium: The atrium was mildly dilated.  ECG: Sinus bradycardia, 1. AVB, inferior MI, age undetermined, unchanged from the ECG on 04/23/2015.   ASSESSMENT AND PLAN:  1. 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  He will continue aspirin, statin, beta blocker, ACE inhibitor. ECG is unchanged from prior.  2. Post CABG Paroxysmal atrial fibrillation:  Maintaining NSR. Normal LVEF, mildly dilated LA. Off amiodarone and coumadin, remains in SB.  3. Essential hypertension:  controlled  4. Hyperlipidemia:  Continue atorvastatin 80 mg po daily, no side effects.    5. Diabetes mellitus type 2, controlled:  FU  with PCP.   Follow up in 1 year.  Tobias Alexander, MD 05/04/2016 11:15 AM    [facility] [address] [place], [place], [place]  [address] Phone: [phone]; Fax: [phone]

## 2016-05-04 NOTE — Patient Instructions (Signed)

## 2016-06-13 DIAGNOSIS — Z23 Encounter for immunization: Secondary | ICD-10-CM | POA: Diagnosis not present

## 2016-06-13 DIAGNOSIS — E1165 Type 2 diabetes mellitus with hyperglycemia: Secondary | ICD-10-CM | POA: Diagnosis not present

## 2016-06-13 DIAGNOSIS — I2510 Atherosclerotic heart disease of native coronary artery without angina pectoris: Secondary | ICD-10-CM | POA: Diagnosis not present

## 2016-06-13 DIAGNOSIS — I10 Essential (primary) hypertension: Secondary | ICD-10-CM | POA: Diagnosis not present

## 2016-10-16 ENCOUNTER — Other Ambulatory Visit: Payer: Self-pay | Admitting: Cardiology

## 2017-04-18 ENCOUNTER — Other Ambulatory Visit: Payer: Self-pay | Admitting: Cardiology

## 2017-04-19 NOTE — Telephone Encounter (Signed)
Please schedule appointment for refills.

## 2017-08-02 ENCOUNTER — Encounter: Payer: Self-pay | Admitting: Cardiology

## 2017-08-02 ENCOUNTER — Ambulatory Visit: Payer: Medicare Other | Admitting: Cardiology

## 2017-08-02 VITALS — BP 148/96 | HR 49 | Ht 72.0 in | Wt 244.4 lb

## 2017-08-02 DIAGNOSIS — I480 Paroxysmal atrial fibrillation: Secondary | ICD-10-CM | POA: Diagnosis not present

## 2017-08-02 DIAGNOSIS — Z951 Presence of aortocoronary bypass graft: Secondary | ICD-10-CM | POA: Diagnosis not present

## 2017-08-02 DIAGNOSIS — I2510 Atherosclerotic heart disease of native coronary artery without angina pectoris: Secondary | ICD-10-CM | POA: Diagnosis not present

## 2017-08-02 NOTE — Patient Instructions (Signed)

## 2017-08-02 NOTE — Progress Notes (Signed)
Patient ID: [name], male   DOB: [birth year], 77 y.o.   MRN: [id number]     Cardiology Office Note  Date:  08/02/2017   ID:  [name], DOB July 18, 1941, MRN [id number]  PCP:  Nila Nephew, MD  Cardiologist:  Dr. Tobias Alexander     History of Present Illness: [name] is a 77 y.o. male with a hx of HTN, HL, diabetes. Admitted 5/15-5/29/16 with a non-STEMI. Cardiac catheterization demonstrated severe 3 vessel CAD, mild segmental LV contraction with preserved overall LVEF. Patient was seen by Dr. Cornelius Moras and underwent CABG 12/05/14 (LIMA-LAD, SVG-PDA, SVG-OM1, SVG-OM2, SVG-DX). Postoperative course was complicated by atrial fibrillation. He converted to NSR on amiodarone and beta blocker. He continued to have recurrent episodes of atrial fibrillation. Therefore, he was also placed on Coumadin for anticoagulation.  He returns for FU.  Since discharge, he has been doing well. Chest soreness is improved. He denies significant dyspnea. He is NYHA 2-2b. He denies orthopnea, PND or significant pedal edema. He denies dizziness, near syncope or syncope. He denies fevers, cough, wheezing. He was seen by the PA at Dr. Orvan July office yesterday. Chest x-ray was performed.  05/04/16 - the patient is coming after 6 weeks, he remains very active and feels great, no chest pain or SOB, he is tolerating his meds and is complaint. No dizziness, palpitations, no LE edema. 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  08/02/2017 - this is one year follow-up, the patient looks and feels great, he continues to write his bike almost daily. 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 He denies any chest pain or shortness of breath. He has no side effects with his medications.  Studies/Reports Reviewed Today:  Carotid US  12/03/14 Bilateral ICA 1-39%  LHC 12/01/14 LM: 25% LAD: Ostial 50%, proximal to mid 99%, mid 80%, D1 75% LCx: Proximal 30%, OM1 40%, OM2 99% RCA: Mid 100%, left-right collaterals  Echo 11/30/14 EF 50-55%, inferior HK, grade 1 diastolic dysfunction Mild MR Mild LAE, mild RAE  Past Medical History:  Diagnosis Date  . Diabetes mellitus without complication (HCC)   . Hyperlipidemia   . Hypertension   . Nephrolithiasis   . 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 CARDIAC CATHETERIZATION N/A 12/01/2014   Procedure: Left Heart Cath and Coronary Angiography;  Surgeon: Tonny Bollman, MD;  [facility]: [facility] INVASIVE CV LAB;  Service: Cardiovascular;  Laterality: N/A;  . CORONARY ARTERY BYPASS GRAFT N/A 12/05/2014   Procedure: CORONARY ARTERY BYPASS GRAFTING (CABG), ON PUMP, TIMES FIVE, USING LEFT INTERNAL MAMMARY ARTERY, BILATERAL GREATER SAPHENOUS VEIN HARVESTED ENDOSCOPICALLY;  Surgeon: Purcell Nails, MD;  Location: [facility] OR;  Service: Open Heart Surgery;  Laterality: N/A;  LIMA-LAD; SEQ SVG-OM1-OM2; SVG-DIAG; SVG-PD  . ROTATOR CUFF REPAIR Left   . TEE WITHOUT CARDIOVERSION N/A 12/05/2014   Procedure: TRANSESOPHAGEAL ECHOCARDIOGRAM (TEE);  Surgeon: Purcell Nails, MD;  Location: [facility];  Service: Open Heart Surgery;  Laterality: N/A;     Current Outpatient Medications  Medication Sig Dispense Refill  . amLODipine (NORVASC) 10 MG tablet Take 1 tablet (10  mg total) by mouth daily. 90 tablet 3  . aspirin EC 81 MG tablet Take 81 mg by mouth daily.    [name]. atorvastatin (LIPITOR) 80 MG tablet Take 1 tablet (80 mg total) by mouth daily at 6 PM. 30 tablet 1  . metFORMIN (GLUCOPHAGE) 500 MG tablet Take 250 mg by mouth daily with breakfast.    . metoprolol tartrate (LOPRESSOR) 25 MG tablet take 1 tablet by mouth twice a day 180 tablet 0  .  Omega-3 Fatty Acids (OMEGA 3 PO) Take 2 tablets by mouth daily.    [name]. PRODIGY TWIST TOP LANCETS 28G MISC   0  . quinapril (ACCUPRIL) 40 MG tablet Take 40 mg by mouth daily.  0   No current facility-administered medications for this visit.     Allergies:   Patient has no known allergies.    Social History:  The patient  reports that he has quit smoking. he has never used smokeless tobacco. He reports that he drinks about 1.2 oz of alcohol per week. He reports that he does not use drugs.   Family History:  The patient's family history includes Alzheimer's disease in his brother and father; Heart attack in his mother; Heart disease in his brother and mother.    ROS:   Please see the history of present illness.   Review of Systems  Constitution: Positive for decreased appetite.  All other systems reviewed and are negative.   PHYSICAL EXAM: VS:  BP (!) 148/96   Pulse (!) 49   Ht 6' (1.829 m)   Wt 244 lb 6.4 oz (110.9 kg)   BMI 33.15 kg/m     Wt Readings from Last 3 Encounters:  08/02/17 244 lb 6.4 oz (110.9 kg)  05/04/16 234 lb 6.4 oz (106.3 kg)  12/22/15 220 lb (99.8 kg)     GEN: Well nourished, well developed, in no acute distress  HEENT: normal  Neck: no JVD,  no masses Cardiac:  Normal S1/S2, RRR; no murmur, no rubs or gallops, no edema  Respiratory:  clear to auscultation bilaterally, no wheezing, rhonchi or rales. GI: soft, nontender, nondistended, + BS MS: no deformity or atrophy  Skin: warm and dry  Neuro:  CNs II-XII intact, Strength and sensation are intact Psych: Normal affect  EKG:  EKG is ordered today.  It demonstrates:   Sinus bradycardia, HR 52, first-degree AV block (PR-2 68 ms), LAD, IVCD  Recent Labs: No results found for requested labs within last 8760 hours.   Lipid Panel    Component Value Date/Time   CHOL 156 11/30/2014 0713   TRIG 78 11/30/2014 0713   HDL 42 11/30/2014 0713   CHOLHDL 3.7 11/30/2014 0713   VLDL 16 11/30/2014 0713   LDLCALC  98 11/30/2014 0713    TTE: 11/30/2014 - Left ventricle: The cavity size was normal. Systolic function was normal. The estimated ejection fraction was in the range of 50% to 55%. Probable mild hypokinesis of the mid-apicalinferior myocardium. Doppler parameters are consistent with abnormal left ventricular relaxation (grade 1 diastolic dysfunction). - Mitral valve: Calcified annulus. There was mild regurgitation. - Left atrium: The atrium was mildly dilated. - Right atrium: The atrium was mildly dilated.  ECG: Performed today 08/02/2017 shows profound sinus bradycardia with first-degree AV block, nonspecific intraventricular block, inferior infarct age undetermined, unchanged from prior. This was personally reviewed.     ASSESSMENT AND PLAN:  1. Coronary artery disease, h/o NSTEMI --> S/P CABG x 5 on 12/05/2014:  Patient is  doing great, high level athlete for age and asymptomatic.  He will continue aspirin, statin, beta blocker, ACE inhibitor. ECG is unchanged from prior. We will obtain labs from his PCP Dr Chilton Si.  2. Post CABG Paroxysmal atrial fibrillation:  Maintaining sinus bradycardia, asymptomatic, no dizziness. Normal LVEF, mildly dilated LA. Off amiodarone and coumadin, remains in SB.  3. Essential hypertension:  controlled  4. Hyperlipidemia:  Continue atorvastatin 80 mg po daily, no side effects.    5. Diabetes mellitus type 2, controlled:  FU with PCP.   Follow up in 1 year.  Tobias Alexander, MD 08/02/2017 9:28 AM    [facility] [address] [place], [place], [place]  [address] Phone: [phone]; Fax: [phone]

## 2018-08-02 ENCOUNTER — Ambulatory Visit: Payer: Medicare Other | Admitting: Cardiology

## 2018-08-02 ENCOUNTER — Encounter: Payer: Self-pay | Admitting: Cardiology

## 2018-08-02 ENCOUNTER — Encounter (INDEPENDENT_AMBULATORY_CARE_PROVIDER_SITE_OTHER): Payer: Self-pay

## 2018-08-02 VITALS — BP 132/74 | HR 47 | Ht 72.0 in | Wt 244.0 lb

## 2018-08-02 DIAGNOSIS — I10 Essential (primary) hypertension: Secondary | ICD-10-CM

## 2018-08-02 DIAGNOSIS — I2510 Atherosclerotic heart disease of native coronary artery without angina pectoris: Secondary | ICD-10-CM

## 2018-08-02 DIAGNOSIS — E7849 Other hyperlipidemia: Secondary | ICD-10-CM

## 2018-08-02 DIAGNOSIS — Z951 Presence of aortocoronary bypass graft: Secondary | ICD-10-CM

## 2018-08-02 MED ORDER — METOPROLOL TARTRATE 25 MG PO TABS: 12.5000 mg | ORAL_TABLET | Freq: Two times a day (BID) | ORAL | 3 refills | Status: AC

## 2018-08-02 NOTE — Progress Notes (Signed)
Patient ID: [name], male   DOB: [birth year], 78 y.o.   MRN: [id number]     Cardiology Office Note  Date:  08/02/2018   ID:  [name], DOB 1941/04/20, MRN [id number]  PCP:  Nila Nephew, MD  Cardiologist:  Dr. Tobias Alexander     History of Present Illness: [name] is a 77 y.o. male with a hx of HTN, HL, diabetes. Admitted 5/15-5/29/16 with a non-STEMI. Cardiac catheterization demonstrated severe 3 vessel CAD, mild segmental LV contraction with preserved overall LVEF. Patient was seen by Dr. Cornelius Moras and underwent CABG 12/05/14 (LIMA-LAD, SVG-PDA, SVG-OM1, SVG-OM2, SVG-DX). Postoperative course was complicated by atrial fibrillation. He converted to NSR on amiodarone and beta blocker. He continued to have recurrent episodes of atrial fibrillation. Therefore, he was also placed on Coumadin for anticoagulation.  He returns for FU.  Since discharge, he has been doing well. Chest soreness is improved. He denies significant dyspnea. He is NYHA 2-2b. He denies orthopnea, PND or significant pedal edema. He denies dizziness, near syncope or syncope. He denies fevers, cough, wheezing. He was seen by the PA at Dr. Orvan July office yesterday. Chest x-ray was performed.  05/04/16 - the patient is coming after 6 weeks, he remains very active and feels great, no chest pain or SOB, he is tolerating his meds and is complaint. No dizziness, palpitations, no LE edema. 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  08/02/2017 - this is one year follow-up, the patient looks and feels great, he continues to write his bike almost daily. 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 He denies any chest pain or shortness of breath. He has no side effects with his medications.  Studies/Reports Reviewed Today:  Carotid US  12/03/14 Bilateral ICA 1-39%  LHC 12/01/14 LM: 25% LAD: Ostial 50%, proximal to mid 99%, mid 80%, D1 75% LCx: Proximal 30%, OM1 40%, OM2 99% RCA: Mid 100%, left-right collaterals  Echo 11/30/14 EF 50-55%, inferior HK, grade 1 diastolic dysfunction Mild MR Mild LAE, mild RAE  Past Medical History:  Diagnosis Date  . Diabetes mellitus without complication (HCC)   . Hyperlipidemia   . Hypertension   . Nephrolithiasis   . 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 CARDIAC CATHETERIZATION N/A 12/01/2014   Procedure: Left Heart Cath and Coronary Angiography;  Surgeon: Tonny Bollman, MD;  Location: [facility] INVASIVE CV LAB;  Service: Cardiovascular;  Laterality: N/A;  . CORONARY ARTERY BYPASS GRAFT N/A 12/05/2014   Procedure: CORONARY ARTERY BYPASS GRAFTING (CABG), ON PUMP, TIMES FIVE, USING LEFT INTERNAL MAMMARY ARTERY, BILATERAL GREATER SAPHENOUS VEIN HARVESTED ENDOSCOPICALLY;  Surgeon: Purcell Nails, MD;  Location: [facility] OR;  Service: Open Heart Surgery;  Laterality: N/A;  LIMA-LAD; SEQ SVG-OM1-OM2; SVG-DIAG; SVG-PD  . ROTATOR CUFF REPAIR Left   . TEE WITHOUT CARDIOVERSION N/A 12/05/2014   Procedure: TRANSESOPHAGEAL ECHOCARDIOGRAM (TEE);  Surgeon: Purcell Nails, MD;  Location: [facility] OR;  Service: Open Heart Surgery;  Laterality: N/A;     Current Outpatient Medications  Medication Sig Dispense Refill  . amLODipine (NORVASC) 10 MG tablet Take 1 tablet (10  mg total) by mouth daily. 90 tablet 3  . aspirin EC 81 MG tablet Take 81 mg by mouth daily.    [name] atorvastatin (LIPITOR) 80 MG tablet Take 1 tablet (80 mg total) by mouth daily at 6 PM. 30 tablet 1  . metFORMIN (GLUCOPHAGE) 500 MG tablet Take 250 mg by mouth daily with breakfast.    . metoprolol tartrate (LOPRESSOR) 25 MG tablet take 1 tablet by mouth twice a day 180 tablet 0  .  Omega-3 Fatty Acids (OMEGA 3 PO) Take 2 tablets by mouth daily.    [name] PRODIGY TWIST TOP LANCETS 28G MISC   0  . quinapril (ACCUPRIL) 40 MG tablet Take 40 mg by mouth daily.  0   No current facility-administered medications for this visit.     Allergies:   Patient has no known allergies.    Social History:  The patient  reports that he has quit smoking. He has never used smokeless tobacco. He reports current alcohol use of about 2.0 standard drinks of alcohol per week. He reports that he does not use drugs.   Family History:  The patient's family history includes Alzheimer's disease in his brother and father; Heart attack in his mother; Heart disease in his brother and mother.    ROS:   Please see the history of present illness.   Review of Systems  Constitution: Positive for decreased appetite.  All other systems reviewed and are negative.   PHYSICAL EXAM: VS:  BP 132/74   Pulse (!) 47   Ht 6' (1.829 m)   Wt 244 lb (110.7 kg)   SpO2 97%   BMI 33.09 kg/m     Wt Readings from Last 3 Encounters:  08/02/18 244 lb (110.7 kg)  08/02/17 244 lb 6.4 oz (110.9 kg)  05/04/16 234 lb 6.4 oz (106.3 kg)     GEN: Well nourished, well developed, in no acute distress  HEENT: normal  Neck: no JVD,  no masses Cardiac:  Normal S1/S2, RRR; no murmur, no rubs or gallops, no edema  Respiratory:  clear to auscultation bilaterally, no wheezing, rhonchi or rales. GI: soft, nontender, nondistended, + BS MS: no deformity or atrophy  Skin: warm and dry  Neuro:  CNs II-XII intact, Strength and sensation are intact Psych: Normal affect  EKG:  EKG is ordered today.  It demonstrates:   Sinus bradycardia, HR 52, first-degree AV block (PR-2 68 ms), LAD, IVCD  Recent Labs: No results found for requested labs within last 8760 hours.   Lipid Panel    Component Value Date/Time   CHOL 156 11/30/2014 0713   TRIG 78 11/30/2014 0713   HDL 42 11/30/2014 0713   CHOLHDL 3.7 11/30/2014 0713   VLDL 16  11/30/2014 0713   LDLCALC 98 11/30/2014 0713    TTE: 11/30/2014 - Left ventricle: The cavity size was normal. Systolic function was normal. The estimated ejection fraction was in the range of 50% to 55%. Probable mild hypokinesis of the mid-apicalinferior myocardium. Doppler parameters are consistent with abnormal left ventricular relaxation (grade 1 diastolic dysfunction). - Mitral valve: Calcified annulus. There was mild regurgitation. - Left atrium: The atrium was mildly dilated. - Right atrium: The atrium was mildly dilated.  ECG: Performed today 08/02/2017 shows profound sinus bradycardia with first-degree AV block, nonspecific intraventricular block, inferior infarct age undetermined, unchanged from prior. This was personally reviewed.     ASSESSMENT AND PLAN:  1. 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  He rode 1200 miles on a bike last year.  69 miles just in the last 3 days. He will continue aspirin, statin, beta blocker, ACE inhibitor. ECG is unchanged from prior.  We will decrease dose of metoprolol as his heart rate is in 40s. We will obtain labs from his PCP Dr Chilton SiGreen.  2. Post CABG Paroxysmal atrial fibrillation:  Maintaining sinus bradycardia, asymptomatic, no dizziness. Normal LVEF, mildly dilated LA. Off amiodarone and coumadin, remains in SB.  3. Essential hypertension:  controlled  4. Hyperlipidemia:  Continue atorvastatin 80 mg po daily, no side effects.    5. Diabetes mellitus type 2, controlled:  FU with PCP.   Follow up in 1 year.  Tobias AlexanderKatarina Ananias Kolander, MD 08/02/2018 9:36 AM    [facility] [address], [place], [place]  [address] Phone: [phone]) [phone]; Fax: [phone](336) [phone]

## 2018-08-02 NOTE — Patient Instructions (Signed)
Medication Instructions:   DECREASE YOUR METOPROLOL TARTRATE TO 12.5 MG BY MOUTH TWICE DAILY   If you need a refill on your cardiac medications before your next appointment, please call your pharmacy.     Follow-Up: At [facility], you and your health needs are our priority.  As part of our continuing mission to provide you with exceptional heart care, we have created designated Provider Care Teams.  These Care Teams include your primary Cardiologist (physician) and Advanced Practice Providers (APPs -  Physician Assistants and Nurse Practitioners) who all work together to provide you with the care you need, when you need it. You will need a follow up appointment in 12 months.  Please call our office 2 months in advance to schedule this appointment.  You may see Tobias Alexander, MD or one of the following Advanced Practice Providers on your designated Care Team:   [place], PA-C Ronie Spies, PA-C . Jacolyn Reedy, PA-C

## 2019-07-24 ENCOUNTER — Emergency Department (HOSPITAL_COMMUNITY)
Admission: EM | Admit: 2019-07-24 | Discharge: 2019-07-24 | Disposition: A | Discharge status: Home / Self Care | Payer: Medicare Other | Attending: Emergency Medicine | Admitting: Emergency Medicine

## 2019-07-24 ENCOUNTER — Emergency Department (HOSPITAL_COMMUNITY): Payer: Medicare Other

## 2019-07-24 ENCOUNTER — Telehealth: Payer: Self-pay | Admitting: Cardiology

## 2019-07-24 ENCOUNTER — Encounter (HOSPITAL_COMMUNITY): Payer: Self-pay | Admitting: Emergency Medicine

## 2019-07-24 DIAGNOSIS — R0789 Other chest pain: Secondary | ICD-10-CM | POA: Diagnosis not present

## 2019-07-24 DIAGNOSIS — Z7984 Long term (current) use of oral hypoglycemic drugs: Secondary | ICD-10-CM | POA: Diagnosis not present

## 2019-07-24 DIAGNOSIS — Z87891 Personal history of nicotine dependence: Secondary | ICD-10-CM | POA: Insufficient documentation

## 2019-07-24 DIAGNOSIS — I2510 Atherosclerotic heart disease of native coronary artery without angina pectoris: Secondary | ICD-10-CM | POA: Diagnosis not present

## 2019-07-24 DIAGNOSIS — Z7982 Long term (current) use of aspirin: Secondary | ICD-10-CM | POA: Insufficient documentation

## 2019-07-24 DIAGNOSIS — I10 Essential (primary) hypertension: Secondary | ICD-10-CM | POA: Diagnosis not present

## 2019-07-24 DIAGNOSIS — Z951 Presence of aortocoronary bypass graft: Secondary | ICD-10-CM | POA: Diagnosis not present

## 2019-07-24 DIAGNOSIS — Z79899 Other long term (current) drug therapy: Secondary | ICD-10-CM | POA: Diagnosis not present

## 2019-07-24 DIAGNOSIS — E119 Type 2 diabetes mellitus without complications: Secondary | ICD-10-CM | POA: Diagnosis not present

## 2019-07-24 LAB — BASIC METABOLIC PANEL
Anion gap: 10 (ref 5–15)
BUN: 17 mg/dL (ref 8–23)
CO2: 22 mmol/L (ref 22–32)
Calcium: 9.1 mg/dL (ref 8.9–10.3)
Chloride: 108 mmol/L (ref 98–111)
Creatinine, Ser: 0.93 mg/dL (ref 0.61–1.24)
GFR calc Af Amer: >60 mL/min (ref >60)
GFR calc non Af Amer: >60 mL/min (ref >60)
Glucose, Bld: 139 mg/dL — ABNORMAL HIGH (ref 70–99)
Potassium: 3.8 mmol/L (ref 3.5–5.1)
Sodium: 140 mmol/L (ref 135–145)

## 2019-07-24 LAB — CBC
HCT: 42.6 % (ref 39.0–52.0)
Hemoglobin: 14.5 g/dL (ref 13.0–17.0)
MCH: 31.2 pg (ref 26.0–34.0)
MCHC: 34 g/dL (ref 30.0–36.0)
MCV: 91.6 fL (ref 80.0–100.0)
Platelets: 164 10*3/uL (ref 150–400)
RBC: 4.65 MIL/uL (ref 4.22–5.81)
RDW: 12.3 % (ref 11.5–15.5)
WBC: 8.4 10*3/uL (ref 4.0–10.5)
nRBC: 0 % (ref 0.0–0.2)

## 2019-07-24 LAB — TROPONIN I (HIGH SENSITIVITY)
Troponin I (High Sensitivity): 5 ng/L (ref <18)
Troponin I (High Sensitivity): 7 ng/L (ref <18)

## 2019-07-24 MED ORDER — SODIUM CHLORIDE 0.9% FLUSH: 3.0000 mL | Freq: Once | INTRAVENOUS | Status: DC

## 2019-07-24 NOTE — ED Notes (Signed)
Patient transported to X-ray 

## 2019-07-24 NOTE — Telephone Encounter (Signed)
New Message    Pt is calling and says he was seen in the ER this morning. He is wondering if he needs to be seen sooner then his appt in March     Please call

## 2019-07-24 NOTE — ED Triage Notes (Signed)
Pt BIB [facility] from home C/O chest pain x24 hrs. Pt states his chest pain is on the left side non -radiating . Pt denies SHOB, N/V/D. Pt took 324 ASA prior to EMS arrival.

## 2019-07-24 NOTE — Telephone Encounter (Signed)
Pt was seen in the ER at [facility] this morning for diagnosis of non-cardiac chest pain. Pt had cardiac work-up done there to rule out any acute cardiac issues. Pt was discharged this morning and was advised by ER MD Dr. Preston Fleeting,  to reach out to his Cardiologist for follow-up of ER visit, and to have further evaluation of chest pain, given his cardiac history. Scheduled the pt to come into the office and see Nada Boozer NP for tomorrow 07/25/19 at 1000.  Pt is aware to arrive 15 mins prior to this appt, and wear his facial mask to the appt and during the entire duration of this appt.  Informed the pt that we will keep his follow-up appt as scheduled with Dr. Delton See for 10/04/19, and Vernona Rieger can further advise tomorrow if he should continue with this appt, or move this to a different date.  Informed the pt that I will send Dr. Delton See a message to make her aware of his ER visit today, and that he will come into the office to see Nada Boozer NP tomorrow, for further evaluation of this visit.  Pt verbalized understanding and agrees with this plan.  Pt was more than gracious for all the assistance provided.  Will send chart prep a message about add on for tomorrow.

## 2019-07-24 NOTE — ED Provider Notes (Signed)
[facility] EMERGENCY DEPARTMENT Provider Note   CSN: [id number] Arrival date & time: 07/24/19  0208   History Chief Complaint  Patient presents with  . Chest Pain    [name] is a 79 y.o. male.  The history is provided by the patient.  Chest Pain He has history of hypertension, diabetes, hyperlipidemia, coronary artery disease status post coronary artery bypass and comes in because of chest pain.  He has a dull, achy pain in the left upper anterior chest that started last night.  It was present through the night and into the morning before resolving.  It recurred again tonight at about 11 PM.  Pain does not radiate.  He rates it at 3/10.  There is no associated dyspnea, nausea, diaphoresis.  Pain is not affected by breathing, movement, exertion.  He did take 4 aspirin 81 mg tablets tonight, and pain is now completely gone.  This pain is different than what he had prior to his coronary artery bypass.  Past Medical History:  Diagnosis Date  . Diabetes mellitus without complication (HCC)   . Hyperlipidemia   . Hypertension   . Nephrolithiasis   . 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 Coronary artery disease involving native coronary artery of native heart without angina pectoris 12/29/2014  . Essential hypertension 12/29/2014  . Hyperlipidemia 12/29/2014  . Diabetes mellitus type 2, controlled (HCC) 12/29/2014  . Atrial fibrillation (HCC) 12/11/2014  . 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 NSTEMI (non-ST elevated myocardial infarction) (HCC) 11/30/2014  . Nephrolithiasis     Past Surgical History:  Procedure Laterality Date  . CARDIAC CATHETERIZATION N/A 12/01/2014   Procedure: Left Heart Cath and Coronary Angiography;  Surgeon: Tonny Bollman, MD;  Location: [facility] INVASIVE CV LAB;  Service:  Cardiovascular;  Laterality: N/A;  . CORONARY ARTERY BYPASS GRAFT N/A 12/05/2014   Procedure: CORONARY ARTERY BYPASS GRAFTING (CABG), ON PUMP, TIMES FIVE, USING LEFT INTERNAL MAMMARY ARTERY, BILATERAL GREATER SAPHENOUS VEIN HARVESTED ENDOSCOPICALLY;  Surgeon: Purcell Nails, MD;  Location: [facility] OR;  Service: Open Heart Surgery;  Laterality: N/A;  LIMA-LAD; SEQ SVG-OM1-OM2; SVG-DIAG; SVG-PD  . ROTATOR CUFF REPAIR Left   . TEE WITHOUT CARDIOVERSION N/A 12/05/2014   Procedure: TRANSESOPHAGEAL ECHOCARDIOGRAM (TEE);  Surgeon: Purcell Nails, MD;  Location: [facility] OR;  Service: Open Heart Surgery;  Laterality: N/A;       Family History  Problem Relation Age of Onset  . Heart disease Mother   . Heart attack Mother   . Alzheimer's disease Father   . Heart disease Brother   . Alzheimer's disease Brother     Social History   Tobacco Use  . Smoking status: Former Games developer  . Smokeless tobacco: Never Used  Substance Use Topics  . Alcohol use: Yes    Alcohol/week: 2.0 standard drinks    Types: 1 Glasses of wine, 1 Cans of beer per week  . Drug use: No    Home Medications Prior to Admission medications   Medication Sig Start Date End Date Taking? Authorizing Provider  amLODipine (NORVASC) 10 MG tablet Take 1 tablet (10 mg total) by mouth daily. 05/04/16   Lars Masson, MD  aspirin EC 81 MG tablet Take 81 mg by mouth daily.    [provider]  atorvastatin (LIPITOR) 80 MG tablet Take 1 tablet (80 mg total) by mouth daily at 6 PM. 12/12/14   Nani Skillern, PA-C  metFORMIN (GLUCOPHAGE) 500 MG tablet Take 250 mg by mouth daily with breakfast.    [provider]  metoprolol tartrate (LOPRESSOR) 25 MG tablet Take 0.5 tablets (12.5 mg total) by mouth 2 (two) times daily. 08/02/18   Dorothy Spark, MD  Omega-3 Fatty Acids (OMEGA 3 PO) Take 2 tablets by mouth daily.    [provider]  PRODIGY TWIST TOP LANCETS 28G [place]  09/18/14   [provider]   quinapril (ACCUPRIL) 40 MG tablet Take 40 mg by mouth daily. 10/29/14   [provider]    Allergies    Patient has no known allergies.  Review of Systems   Review of Systems  Cardiovascular: Positive for chest pain.  All other systems reviewed and are negative.   Physical Exam Updated Vital Signs BP (!) 155/91 (BP Location: Right Arm)   Pulse 71   Temp 98.4 F (36.9 C) (Tympanic)   Resp 16   Ht 6\' 8"  (2.032 m)   Wt 103 kg   SpO2 98%   BMI 24.94 kg/m   Physical Exam Vitals and nursing note reviewed.   79 year old male, resting comfortably and in no acute distress. Vital signs are significant for elevated blood pressure. Oxygen saturation is 94%, which is normal. Head is normocephalic and atraumatic. PERRLA, EOMI. Oropharynx is clear. Neck is nontender and supple without adenopathy or JVD. Back is nontender and there is no CVA tenderness. Lungs are clear without rales, wheezes, or rhonchi. Chest is nontender. Heart has regular rate and rhythm without murmur. Abdomen is soft, flat, nontender without masses or hepatosplenomegaly and peristalsis is normoactive. Extremities have no cyanosis or edema, full range of motion is present. Skin is warm and dry without rash. Neurologic: Mental status is normal, cranial nerves are intact, there are no motor or sensory deficits.  ED Results / Procedures / Treatments   Labs (all labs ordered are listed, but only abnormal results are displayed) Labs Reviewed  BASIC METABOLIC PANEL - Abnormal; Notable for the following components:      Result Value   Glucose, Bld 139 (*)    All other components within normal limits  CBC  TROPONIN I (HIGH SENSITIVITY)  TROPONIN I (HIGH SENSITIVITY)    EKG EKG Interpretation  Date/Time:  Wednesday July 24 2019 02:14:16 EST Ventricular Rate:  70 PR Interval:    QRS Duration: 135 QT Interval:  432 QTC Calculation: 467 R Axis:   -73 Text Interpretation: Sinus rhythm Borderline  prolonged PR interval Nonspecific IVCD with LAD Inferior infarct, old When compared with ECG of 12/07/2014, Sinus rhythm has replaced Atrial fibrillation with rapid ventricular response Confirmed by Delora Fuel ([id number]) on 07/24/2019 2:16:53 AM   Radiology DG Chest 2 View  Result Date: 07/24/2019 CLINICAL DATA:  [age]-year-old male with chest pain. EXAM: CHEST - 2 VIEW COMPARISON:  Chest radiograph dated 12/29/2014 FINDINGS: No focal consolidation, pleural effusion, pneumothorax. There is mild cardiomegaly. Mild atherosclerotic calcification of the aorta. Median sternotomy wires and CABG vascular clips. No acute osseous pathology. IMPRESSION: No active cardiopulmonary disease. Electronically Signed   By: Anner Crete M.D.   On: 07/24/2019 03:06    Procedures Procedures   Medications Ordered in ED Medications  sodium chloride flush (NS) 0.9 % injection 3 mL (has no administration in time range)    ED Course  I have reviewed the triage vital signs and the nursing notes.  Pertinent labs & imaging results that were available during my care of the patient were reviewed by me and considered in my medical decision making (see chart for details).  MDM Rules/Calculators/A&P Chest pain in patient who is status post coronary artery bypass.  Pain is distinctly different from his prior cardiac pain.  ECG shows no ST or T changes.  Old records were reviewed confirming that he is followed by cardiology status post coronary artery bypass.  He did have atrial fibrillation in the post surgical period and was on warfarin which was discontinued about 6 months later.  Since he is pain-free, will get cardiac enzymes x2.  If negative, he will be able to be discharged.  Troponins are negative x2.  No other concerning lab reports.  Chest x-ray shows no acute disease.  He is felt to be safe for discharge.  He is advised to follow-up with his cardiologist, use over-the-counter analgesics as needed for pain.  Final  Clinical Impression(s) / ED Diagnoses Final diagnoses:  Non-cardiac chest pain    Rx / DC Orders ED Discharge Orders    None       Dione Booze, MD 07/24/19 [phone]

## 2019-07-24 NOTE — Discharge Instructions (Addendum)
Your heart tests and ECG did not show any signs of any heart injury or damage.  I do not believe this pain is related to your heart.  However, it would be prudent to make a follow-up appointment with your cardiologist.  In the meantime, take acetaminophen, ibuprofen, or naproxen as needed for pain.  Return if any concerning symptoms develop.

## 2019-07-25 ENCOUNTER — Encounter: Payer: Self-pay | Admitting: Cardiology

## 2019-07-25 ENCOUNTER — Ambulatory Visit: Payer: Medicare Other | Admitting: Cardiology

## 2019-07-25 ENCOUNTER — Other Ambulatory Visit: Payer: Self-pay

## 2019-07-25 VITALS — BP 118/70 | HR 63 | Ht 72.0 in | Wt 231.1 lb

## 2019-07-25 DIAGNOSIS — R079 Chest pain, unspecified: Secondary | ICD-10-CM | POA: Diagnosis not present

## 2019-07-25 DIAGNOSIS — I10 Essential (primary) hypertension: Secondary | ICD-10-CM | POA: Diagnosis not present

## 2019-07-25 DIAGNOSIS — I2510 Atherosclerotic heart disease of native coronary artery without angina pectoris: Secondary | ICD-10-CM

## 2019-07-25 DIAGNOSIS — E7849 Other hyperlipidemia: Secondary | ICD-10-CM

## 2019-07-25 DIAGNOSIS — Z951 Presence of aortocoronary bypass graft: Secondary | ICD-10-CM | POA: Diagnosis not present

## 2019-07-25 NOTE — Progress Notes (Signed)
Cardiology Office Note   Date:  07/26/2019   ID:  [name], DOB Aug 14, 1940, MRN [id number]  PCP:  Charlane Ferretti, DO  Cardiologist:  Dr. Delton See    Chief Complaint  Patient presents with  . Hospitalization Follow-up      History of Present Illness: [name] is a 79 y.o. male who presents for post hospitalization for chest pain.  Last seen by Dr. Delton See 08/02/18  He has a hx of HTN, HL, diabetes. Admitted 5/15-5/29/16 with a non-STEMI. Cardiac catheterization demonstrated severe 3 vessel CAD, mild segmental LV contraction with preserved overall LVEF. Patient was seen by Dr. Cornelius Moras and underwent CABG 12/05/14 (LIMA-LAD, SVG-PDA, SVG-OM1, SVG-OM2, SVG-DX). Postoperative course was complicated by atrial fibrillation. He converted to NSR on amiodarone and beta blocker. He continued to have recurrent episodes of atrial fibrillation. Therefore, he was also placed on Coumadin for anticoagulation.  He returns for FU.  Since discharge, he has been doing well. Chest soreness is improved. He denies significant dyspnea. He is NYHA 2-2b. He denies orthopnea, PND or significant pedal edema. He denies dizziness, near syncope or syncope. He denies fevers, cough, wheezing. He was seen by the PA at Dr. Orvan July office yesterday. Chest x-ray was performed.  05/04/16 - the patient is coming after 6 weeks, he remains very active and feels great, no chest pain or SOB, he is tolerating his meds and is complaint. No dizziness, palpitations, no LE edema. 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  08/02/2017 - this is one year follow-up, the patient looks and feels great, he continues to write his bike almost daily. 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 He denies any chest pain or shortness of breath. He has no side  effects with his medications.  Pt seen in ER 07/23/18 for 24 hours of chest pain.  No associated symptoms - called EMS and had taken ASA prior to their arrival.  EKG  SR with IVCD and old inf MI compared to 07/2018 no change but faster rate.  Troponin 7 and 5.  Normal CBC and BMP.  Pain different than prior to CABG.  Felt to be non cardiac chest pain.  No cath or stress test since CABG  Pre cabg echo with normal EF   Today he has had no further pain.  He feels well, he has been riding his bike without any chest discomfort or SOB.  No lightheadedness.     Past Medical History:  Diagnosis Date  . Diabetes mellitus without complication (HCC)   . Hyperlipidemia   . Hypertension   . Nephrolithiasis   . 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 CARDIAC CATHETERIZATION N/A 12/01/2014   Procedure: Left Heart Cath and Coronary Angiography;  Surgeon: Tonny Bollman, MD;  Location: [facility] INVASIVE CV LAB;  Service: Cardiovascular;  Laterality: N/A;  . CORONARY ARTERY BYPASS GRAFT N/A 12/05/2014   Procedure: CORONARY ARTERY BYPASS GRAFTING (CABG), ON PUMP, TIMES FIVE, USING LEFT INTERNAL MAMMARY ARTERY, BILATERAL GREATER SAPHENOUS VEIN HARVESTED ENDOSCOPICALLY;  Surgeon: Purcell Nails, MD;  Location: [facility] OR;  Service: Open  Heart Surgery;  Laterality: N/A;  LIMA-LAD; SEQ SVG-OM1-OM2; SVG-DIAG; SVG-PD  . ROTATOR CUFF REPAIR Left   . TEE WITHOUT CARDIOVERSION N/A 12/05/2014   Procedure: TRANSESOPHAGEAL ECHOCARDIOGRAM (TEE);  Surgeon: Rexene Alberts, MD;  Location: [place];  Service: Open Heart Surgery;  Laterality: N/A;     Current Outpatient Medications  Medication Sig Dispense Refill  . amLODipine (NORVASC) 10 MG tablet Take 1 tablet (10 mg total) by mouth daily. 90 tablet 3  . aspirin EC 81 MG tablet Take 81 mg by mouth daily.    [name] atorvastatin  (LIPITOR) 80 MG tablet Take 1 tablet (80 mg total) by mouth daily at 6 PM. 30 tablet 1  . metFORMIN (GLUCOPHAGE) 500 MG tablet Take 250 mg by mouth daily with breakfast.    . metoprolol tartrate (LOPRESSOR) 25 MG tablet Take 0.5 tablets (12.5 mg total) by mouth 2 (two) times daily. 90 tablet 3  . Omega-3 Fatty Acids (OMEGA 3 PO) Take 2 tablets by mouth daily.    [name] PRODIGY TWIST TOP LANCETS 28G MISC   0  . quinapril (ACCUPRIL) 40 MG tablet Take 40 mg by mouth daily.  0  . tamsulosin (FLOMAX) 0.4 MG CAPS capsule Take 0.4 mg by mouth daily.     No current facility-administered medications for this visit.    Allergies:   Patient has no known allergies.    Social History:  The patient  reports that he has quit smoking. He has never used smokeless tobacco. He reports current alcohol use of about 2.0 standard drinks of alcohol per week. He reports that he does not use drugs.   Family History:  The patient's family history includes Alzheimer's disease in his brother and father; Heart attack in his mother; Heart disease in his brother and mother.    ROS:  General:no colds or fevers, no weight changes Skin:no rashes or ulcers HEENT:no blurred vision, no congestion CV:see HPI PUL:see HPI GI:no diarrhea constipation or melena, no indigestion GU:no hematuria, no dysuria MS:no joint pain, no claudication Neuro:no syncope, no lightheadedness Endo:+ diabetes controlled, no thyroid disease  Wt Readings from Last 3 Encounters:  07/25/19 231 lb 1.9 oz (104.8 kg)  07/24/19 227 lb (103 kg)  08/02/18 244 lb (110.7 kg)     PHYSICAL EXAM: VS:  BP 118/70   Pulse 63   Ht 6' (1.829 m)   Wt 231 lb 1.9 oz (104.8 kg)   SpO2 99%   BMI 31.35 kg/m  , BMI Body mass index is 31.35 kg/m. General:Pleasant affect, NAD Skin:Warm and dry, brisk capillary refill HEENT:normocephalic, sclera clear, mucus membranes moist Neck:supple, no JVD, no bruits  Heart:S1S2 RRR without murmur, gallup, rub or  click Lungs:clear without rales, rhonchi, or wheezes XHB:ZJIR, non tender, + BS, do not palpate liver spleen or masses Ext:no lower ext edema, 2+ pedal pulses, 2+ radial pulses Neuro:alert and oriented X 3, MAE, follows commands, + facial symmetry    EKG:  EKG is NOT ordered today. The ekg from ER was reviewed and no changes, SR no ST changes.  Recent Labs: 07/24/2019: BUN 17; Creatinine, Ser 0.93; Hemoglobin 14.5; Platelets 164; Potassium 3.8; Sodium 140    Lipid Panel    Component Value Date/Time   CHOL 156 11/30/2014 0713   TRIG 78 11/30/2014 0713   HDL 42 11/30/2014 0713   CHOLHDL 3.7 11/30/2014 0713   VLDL 16 11/30/2014 0713   LDLCALC 98 11/30/2014 0713       Other studies Reviewed: Additional studies/  records that were reviewed today include: . Carotid US 12/03/14 Bilateral ICA 1-39%  LHC 12/01/14 LM: 25% LAD: Ostial 50%, proximal to mid 99%, mid 80%, D1 75% LCx: Proximal 30%, OM1 40%, OM2 99% RCA: Mid 100%, left-right collaterals  Echo 11/30/14 EF 50-55%, inferior HK, grade 1 diastolic dysfunction Mild MR Mild LAE, mild RAE   ASSESSMENT AND PLAN:  1.  Chest pain, relief with ASA, occurred at rest and with exertion, would come and go.  He has not had any riding his bike or more strenuous activity, all troponins neg.  EKG without changes.  Felt to be non cardiac, the pt and I discussed doing a stress test, no eval since CABG in 2016.  But he would prefer not to have at this time.  He is aware if any further pain it would be recommended. Discussed to monitor discomfort with bike riding.  He is agreeable. He will keep follow up in March.  2.  CAD with CABG 2016, no exertional angina -   3.  HTN controlled  4.  HLD stable on statin.       Current medicines are reviewed with the patient today.  The patient Has no concerns regarding medicines.  The following changes have been made:  See above Labs/ tests ordered today include:see above  Disposition:   FU:   see above  Signed, Nada Boozer, NP  07/26/2019 8:49 AM    [facility] [address] [place], [place], [place]  [address]/ [address] [place], [place] Phone: [phone]; Fax: [phone]  [phone]

## 2019-07-25 NOTE — Patient Instructions (Signed)
Medication Instructions:   Your physician recommends that you continue on your current medications as directed. Please refer to the Current Medication list given to you today.  *If you need a refill on your cardiac medications before your next appointment, please call your pharmacy*  Lab Work:  None ordered today  If you have labs (blood work) drawn today and your tests are completely normal, you will receive your results only by: [name] MyChart Message (if you have MyChart) OR . A paper copy in the mail If you have any lab test that is abnormal or we need to change your treatment, we will call you to review the results.  Testing/Procedures:  None ordered today  Follow-Up:  Keep scheduled follow up with Dr. Delton See in March.

## 2019-08-27 NOTE — Progress Notes (Signed)
Cardiology Office Note   Date:  08/28/2019   ID:  [name], DOB 1941/01/20, MRN [id number]  PCP:  Charlane Ferretti, DO  Cardiologist:  Dr. Delton See    Reason for visit: 4 week follow up for chest pain   History of Present Illness: [name] is a 79 y.o. male with hx of HTN, HL, diabetes. Admitted 5/15-5/29/16 with a non-STEMI. Cardiac catheterization demonstrated severe 3 vessel CAD, mild segmental LV contraction with preserved overall LVEF. Patient was seen by Dr. Cornelius Moras and underwent CABG 12/05/14 (LIMA-LAD, SVG-PDA, SVG-OM1, SVG-OM2, SVG-DX). Postoperative course was complicated by atrial fibrillation. He converted to NSR on amiodarone and beta blocker. He continued to have recurrent episodes of atrial fibrillation. Therefore, he was also placed on Coumadin for anticoagulation. He became very active again, riding his bike daily.  Pt seen in ER 07/24/19 for 24 hours of chest pain.  No associated symptoms - called EMS and had taken ASA prior to their arrival.  EKG  SR with IVCD and old inf MI compared to 07/2018 no change but faster rate.  Troponin 7 and 5.  Normal CBC and BMP.  Pain different than prior to CABG.  Felt to be non cardiac chest pain.  No cath or stress test since CABG  Pre cabg echo with normal EF   08/28/2019 - 4 week follow up, the patient has been doing well, he had an episode of left-sided sharp chest pain on July 24, 2019 when he went to the ER, no pain since then.  Patient believes it was a gastric reflux.  He restarted his usual biking, he rode 10 miles yesterday without any chest pain or shortness of breath.  He also does not have any palpitations, dizziness, no lower extremity edema.  Past Medical History:  Diagnosis Date  . Diabetes mellitus without complication (HCC)   . Hyperlipidemia   . Hypertension   . Nephrolithiasis   . 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 CARDIAC CATHETERIZATION N/A 12/01/2014   Procedure: Left Heart Cath and Coronary Angiography;  Surgeon: Tonny Bollman, MD;  Location: [facility] INVASIVE CV LAB;  Service: Cardiovascular;  Laterality: N/A;  . CORONARY ARTERY BYPASS GRAFT N/A 12/05/2014   Procedure: CORONARY ARTERY BYPASS GRAFTING (CABG), ON PUMP, TIMES FIVE, USING LEFT INTERNAL MAMMARY ARTERY, BILATERAL GREATER SAPHENOUS VEIN HARVESTED ENDOSCOPICALLY;  Surgeon: Purcell Nails, MD;  Location: [facility] OR;  Service: Open Heart Surgery;  Laterality: N/A;  LIMA-LAD; SEQ SVG-OM1-OM2; SVG-DIAG; SVG-PD  . ROTATOR CUFF REPAIR Left   . TEE WITHOUT CARDIOVERSION N/A 12/05/2014   Procedure: TRANSESOPHAGEAL ECHOCARDIOGRAM (TEE);  Surgeon: Purcell Nails, MD;  Location: [facility] OR;  Service: Open Heart Surgery;  Laterality: N/A;     Current Outpatient Medications  Medication Sig Dispense Refill  . amLODipine (NORVASC) 10 MG tablet Take 1 tablet (10 mg total) by mouth daily. 90 tablet 3  . aspirin EC 81 MG tablet Take 81 mg by mouth daily.    [name] atorvastatin (LIPITOR) 80 MG tablet Take 1 tablet (80 mg total) by mouth daily at 6 PM. 30 tablet 1  . metFORMIN (GLUCOPHAGE) 500 MG tablet Take 250 mg by mouth daily with breakfast.    . metoprolol tartrate (LOPRESSOR) 25 MG tablet Take 0.5 tablets (12.5 mg total) by mouth 2 (  two) times daily. 90 tablet 3  . Omega-3 Fatty Acids (OMEGA 3 PO) Take 2 tablets by mouth daily.    [name] PRODIGY TWIST TOP LANCETS 28G MISC   0  . quinapril (ACCUPRIL) 40 MG tablet Take 40 mg by mouth daily.  0  . tamsulosin (FLOMAX) 0.4 MG CAPS capsule Take 0.4 mg by mouth daily.     No current facility-administered medications for this visit.    Allergies:   Patient has no known allergies.    Social History:  The patient  reports that he has quit smoking. He has never used smokeless tobacco. He reports current alcohol use of about 2.0 standard drinks of  alcohol per week. He reports that he does not use drugs.   Family History:  The patient's family history includes Alzheimer's disease in his brother and father; Heart attack in his mother; Heart disease in his brother and mother.    ROS:  General:no colds or fevers, no weight changes Skin:no rashes or ulcers HEENT:no blurred vision, no congestion CV:see HPI PUL:see HPI GI:no diarrhea constipation or melena, no indigestion GU:no hematuria, no dysuria MS:no joint pain, no claudication Neuro:no syncope, no lightheadedness Endo:+ diabetes controlled, no thyroid disease  Wt Readings from Last 3 Encounters:  08/28/19 240 lb (108.9 kg)  07/25/19 231 lb 1.9 oz (104.8 kg)  07/24/19 227 lb (103 kg)     PHYSICAL EXAM: VS:  BP 122/80   Pulse 61   Ht 6' (1.829 m)   Wt 240 lb (108.9 kg)   SpO2 97%   BMI 32.55 kg/m  , BMI Body mass index is 32.55 kg/m. General:Pleasant affect, NAD Skin:Warm and dry, brisk capillary refill HEENT:normocephalic, sclera clear, mucus membranes moist Neck:supple, no JVD, no bruits  Heart:S1S2 RRR without murmur, gallup, rub or click Lungs:clear without rales, rhonchi, or wheezes HKV:QQVZ, non tender, + BS, do not palpate liver spleen or masses Ext:no lower ext edema, 2+ pedal pulses, 2+ radial pulses Neuro:alert and oriented X 3, MAE, follows commands, + facial symmetry    EKG:  EKG is NOT ordered today. The ekg from ER was reviewed and no changes, SR no ST changes.  Recent Labs: 07/24/2019: BUN 17; Creatinine, Ser 0.93; Hemoglobin 14.5; Platelets 164; Potassium 3.8; Sodium 140    Lipid Panel    Component Value Date/Time   CHOL 156 11/30/2014 0713   TRIG 78 11/30/2014 0713   HDL 42 11/30/2014 0713   CHOLHDL 3.7 11/30/2014 0713   VLDL 16 11/30/2014 0713   LDLCALC 98 11/30/2014 0713       Other studies Reviewed: Additional studies/ records that were reviewed today include: . Carotid US 12/03/14 Bilateral ICA 1-39%  LHC 12/01/14 LM:  25% LAD: Ostial 50%, proximal to mid 99%, mid 80%, D1 75% LCx: Proximal 30%, OM1 40%, OM2 99% RCA: Mid 100%, left-right collaterals  Echo 11/30/14 EF 50-55%, inferior HK, grade 1 diastolic dysfunction Mild MR Mild LAE, mild RAE   ASSESSMENT AND PLAN:  1.  Chest pain, relief with ASA, it was atypical, his EKG is unchanged, he is now writing 10 miles a day without symptoms.  I would hold off on any ischemic work-up at this point, recommend to use PPIs.  2.  CAD with CABG 2016, as above, continue low-dose aspirin, Lipitor 80 mg daily, metoprolol Accupril and fish oil.  3.  HTN controlled  4.  HLD stable on statin that are tolerated well without myalgias.  Current medicines are reviewed with the patient today.  The patient Has no concerns regarding medicines.  The following changes have been made:  See above Labs/ tests ordered today include:see above  Disposition:   FU: In 6 months  Signed, Tobias Alexander, MD  08/28/2019 2:01 PM    [facility] [address] [place], [place], [place]  [address]/ [address] [place], [place] Phone: [phone]; Fax: [phone]  [phone]

## 2019-08-28 ENCOUNTER — Ambulatory Visit: Payer: Medicare Other | Admitting: Cardiology

## 2019-08-28 ENCOUNTER — Encounter: Payer: Self-pay | Admitting: Cardiology

## 2019-08-28 ENCOUNTER — Other Ambulatory Visit: Payer: Self-pay

## 2019-08-28 VITALS — BP 122/80 | HR 61 | Ht 72.0 in | Wt 240.0 lb

## 2019-08-28 DIAGNOSIS — I10 Essential (primary) hypertension: Secondary | ICD-10-CM | POA: Diagnosis not present

## 2019-08-28 DIAGNOSIS — I2510 Atherosclerotic heart disease of native coronary artery without angina pectoris: Secondary | ICD-10-CM | POA: Diagnosis not present

## 2019-08-28 DIAGNOSIS — E7849 Other hyperlipidemia: Secondary | ICD-10-CM

## 2019-08-28 DIAGNOSIS — R079 Chest pain, unspecified: Secondary | ICD-10-CM

## 2019-08-28 DIAGNOSIS — Z951 Presence of aortocoronary bypass graft: Secondary | ICD-10-CM

## 2019-08-28 NOTE — Patient Instructions (Signed)
Medication Instructions:   Your physician recommends that you continue on your current medications as directed. Please refer to the Current Medication list given to you today.  *If you need a refill on your cardiac medications before your next appointment, please call your pharmacy*    Follow-Up: At [facility], you and your health needs are our priority.  As part of our continuing mission to provide you with exceptional heart care, we have created designated Provider Care Teams.  These Care Teams include your primary Cardiologist (physician) and Advanced Practice Providers (APPs -  Physician Assistants and Nurse Practitioners) who all work together to provide you with the care you need, when you need it.  Your next appointment:   6 month(s)  The format for your next appointment:   In Person  Provider:   Katarina Nelson, MD    

## 2019-10-04 ENCOUNTER — Ambulatory Visit: Payer: Medicare Other | Admitting: Cardiology

## 2019-10-16 ENCOUNTER — Telehealth: Payer: Self-pay | Admitting: Cardiology

## 2019-10-16 ENCOUNTER — Other Ambulatory Visit: Payer: Self-pay | Admitting: Urology

## 2019-10-16 NOTE — Telephone Encounter (Signed)
   Primary Cardiologist: Tobias Alexander, MD  Chart reviewed as part of pre-operative protocol coverage. He has a history of CAD, with CABG in 2016. Was last seen by Dr. Delton See 08/28/2019 and was stable from CV standpoint.  Given past medical history and time since last visit, based on ACC/AHA guidelines, [name] would be at acceptable risk for the planned procedure without further cardiovascular testing.   I will route this recommendation to the requesting party via Epic fax function and remove from pre-op pool.  Please call with questions.  Bettey Mare. Liborio Nixon, ANP, AACC  10/16/2019, 3:24 PM

## 2019-10-16 NOTE — Telephone Encounter (Signed)
New Message     [facility] Pre-operative Risk Assessment    Request for surgical clearance:  1. What type of surgery is being performed? Bipolar Transurethral Resection of the Prostate; Cystolitholapaxy   2. When is this surgery scheduled? 11/27/19  3. What type of clearance is required (medical clearance vs. Pharmacy clearance to hold med vs. Both)? Medical Clearance  4. Are there any medications that need to be held prior to surgery and how long? N/A  5. Practice name and name of physician performing surgery? [facility]; [facility] [facility] [place]  6. What is your office phone number [phone] ext [phone]   7.   What is your office fax number [phone]  8.   Anesthesia type (None, local, MAC, general) ? General   [name] 10/16/2019, 3:18 PM  _________________________________________________________________   (provider comments below)

## 2019-11-20 ENCOUNTER — Other Ambulatory Visit: Payer: Self-pay

## 2019-11-20 ENCOUNTER — Encounter (HOSPITAL_BASED_OUTPATIENT_CLINIC_OR_DEPARTMENT_OTHER): Payer: Self-pay | Admitting: Urology

## 2019-11-20 NOTE — Progress Notes (Addendum)
ADDENDUM:  Chart reviewed by anesthesia, Jodell Cipro PA,  Ok to proceed.   Spoke w/ via phone for pre-op interview--- PT Lab needs dos---- Istat              Lab results------ current ekg in epic/ chart COVID test ------ 11-23-2019 @ 0905 Arrive at ------- 0630 NPO after ------ MN Medications to take morning of surgery ----- Lopressor, Norvasc, Flomax, Myrbetriq w/ sips of water Diabetic medication ----- do not take metformin morning of surgery Patient Special Instructions ----- reviewed [facility] guidelines and visitor policy.  Asked pt to bring his home medication with him dos in original prescription bottles Pre-Op special Istructions ----- pt telephone cardiac clearance dated 10-16-2019 in epic/ chart. Patient verbalized understanding of instructions that were given at this phone interview. Patient denies shortness of breath, chest pain, fever, cough a this phone interview.   Anesthesia Review: hx CAD with NSTEMI s/p CABG x5 12-05-2014, HTN, DM2.  Pt denies any cardiac s&s, sob, or peripheral swelling.  Chart to be reviewed by Jodell Cipro PA.  PCP: Dr Charlane Ferretti Cardiologist : Dr Eloy End (lov 08-28-2019) Chest x-ray : 07-24-2019 epic EKG : 07-24-2019 epic Echo : 11-30-2014 and TTE 12-05-2014 epic Cardiac Cath :  12-01-2014 epic Sleep Study/ CPAP : NO Fasting Blood Sugar :  110 - 125    / Checks Blood Sugar -- times a day:  Daily in AM Blood Thinner/ Instructions Maurice Small Dose: NO ASA / Instructions/ Last Dose :  ASA 81mg /  Pt stated given instructions from Dr office to stop 2 days prior to surgery.

## 2019-11-23 ENCOUNTER — Other Ambulatory Visit (HOSPITAL_COMMUNITY)
Admission: RE | Admit: 2019-11-23 | Discharge: 2019-11-23 | Disposition: A | Discharge status: Home / Self Care | Payer: Medicare Other | Source: Ambulatory Visit | Attending: Urology | Admitting: Urology

## 2019-11-23 DIAGNOSIS — Z20822 Contact with and (suspected) exposure to covid-19: Secondary | ICD-10-CM | POA: Diagnosis not present

## 2019-11-23 DIAGNOSIS — Z01812 Encounter for preprocedural laboratory examination: Secondary | ICD-10-CM | POA: Diagnosis present

## 2019-11-23 LAB — SARS CORONAVIRUS 2 (TAT 6-24 HRS): SARS Coronavirus 2: NEGATIVE

## 2019-11-26 ENCOUNTER — Encounter (HOSPITAL_BASED_OUTPATIENT_CLINIC_OR_DEPARTMENT_OTHER): Payer: Self-pay | Admitting: Urology

## 2019-11-26 NOTE — Anesthesia Preprocedure Evaluation (Addendum)
Anesthesia Evaluation  Patient identified by MRN, date of birth, ID band Patient awake    Reviewed: Allergy & Precautions, NPO status , Patient's Chart, lab work & pertinent test results, reviewed documented beta blocker date and time   Airway Mallampati: I  TM Distance: >3 FB Neck ROM: Full    Dental no notable dental hx. (+) Edentulous Upper, Edentulous Lower   Pulmonary former smoker,    Pulmonary exam normal breath sounds clear to auscultation       Cardiovascular hypertension, Pt. on medications and Pt. on home beta blockers + CAD and + Past MI  Normal cardiovascular exam+ dysrhythmias Atrial Fibrillation  Rhythm:Regular Rate:Normal  NSTEMI 11/2014 CABG x 5v 11/2014 A. Fib post CABG, currently in NSR  EKG 07/24/19 NSR, prolonged PR, non specific IVCD with LAD, inferior MI  Echo 11/30/14 Left ventricle: The cavity size was normal. Systolic function was normal. The estimated ejection fraction was in the range of 50% to 55%. Probable mild hypokinesis of the mid-apicalinferior myocardium. Doppler parameters are consistent with abnormal left ventricular relaxation (grade 1 diastolic dysfunction).  - Mitral valve: Calcified annulus. There was mild regurgitation.  - Left atrium: The atrium was mildly dilated.  - Right atrium: The atrium was mildly dilated.    Neuro/Psych negative neurological ROS  negative psych ROS   GI/Hepatic Neg liver ROS, GERD  Medicated and Controlled,  Endo/Other  diabetes, Well Controlled, Type 2, Oral Hypoglycemic Agents  Renal/GU Renal diseaseHx/o renal calcluli   BPH with symptoms Bladder calculi    Musculoskeletal negative musculoskeletal ROS (+)   Abdominal   Peds  Hematology negative hematology ROS (+)   Anesthesia Other Findings   Reproductive/Obstetrics                            Anesthesia Physical Anesthesia Plan  ASA: III  Anesthesia Plan: General    Post-op Pain Management:    Induction: Intravenous  PONV Risk Score and Plan: 4 or greater and Ondansetron, Treatment may vary due to age or medical condition and Dexamethasone  Airway Management Planned: LMA  Additional Equipment:   Intra-op Plan:   Post-operative Plan: Extubation in OR  Informed Consent: I have reviewed the patients History and Physical, chart, labs and discussed the procedure including the risks, benefits and alternatives for the proposed anesthesia with the patient or authorized representative who has indicated his/her understanding and acceptance.     Dental advisory given  Plan Discussed with: CRNA and Surgeon  Anesthesia Plan Comments:        Anesthesia Quick Evaluation

## 2019-11-27 ENCOUNTER — Ambulatory Visit (HOSPITAL_BASED_OUTPATIENT_CLINIC_OR_DEPARTMENT_OTHER): Payer: Medicare Other | Admitting: Physician Assistant

## 2019-11-27 ENCOUNTER — Encounter (HOSPITAL_BASED_OUTPATIENT_CLINIC_OR_DEPARTMENT_OTHER)
Admission: RE | Disposition: A | Discharge status: Home / Self Care | Payer: Self-pay | Source: Home / Self Care | Attending: Urology

## 2019-11-27 ENCOUNTER — Observation Stay (HOSPITAL_BASED_OUTPATIENT_CLINIC_OR_DEPARTMENT_OTHER)
Admission: RE | Admit: 2019-11-27 | Discharge: 2019-11-28 | Disposition: A | Discharge status: Home / Self Care | Payer: Medicare Other | Attending: Urology | Admitting: Urology

## 2019-11-27 ENCOUNTER — Encounter (HOSPITAL_BASED_OUTPATIENT_CLINIC_OR_DEPARTMENT_OTHER): Payer: Self-pay | Admitting: Urology

## 2019-11-27 ENCOUNTER — Other Ambulatory Visit: Payer: Self-pay

## 2019-11-27 DIAGNOSIS — N210 Calculus in bladder: Principal | ICD-10-CM | POA: Insufficient documentation

## 2019-11-27 DIAGNOSIS — Z87891 Personal history of nicotine dependence: Secondary | ICD-10-CM | POA: Insufficient documentation

## 2019-11-27 DIAGNOSIS — Z7982 Long term (current) use of aspirin: Secondary | ICD-10-CM | POA: Insufficient documentation

## 2019-11-27 DIAGNOSIS — Z9841 Cataract extraction status, right eye: Secondary | ICD-10-CM | POA: Diagnosis not present

## 2019-11-27 DIAGNOSIS — I252 Old myocardial infarction: Secondary | ICD-10-CM | POA: Insufficient documentation

## 2019-11-27 DIAGNOSIS — R3915 Urgency of urination: Secondary | ICD-10-CM | POA: Diagnosis not present

## 2019-11-27 DIAGNOSIS — E119 Type 2 diabetes mellitus without complications: Secondary | ICD-10-CM | POA: Diagnosis not present

## 2019-11-27 DIAGNOSIS — K219 Gastro-esophageal reflux disease without esophagitis: Secondary | ICD-10-CM | POA: Diagnosis not present

## 2019-11-27 DIAGNOSIS — I4891 Unspecified atrial fibrillation: Secondary | ICD-10-CM | POA: Insufficient documentation

## 2019-11-27 DIAGNOSIS — Z951 Presence of aortocoronary bypass graft: Secondary | ICD-10-CM | POA: Diagnosis not present

## 2019-11-27 DIAGNOSIS — I2510 Atherosclerotic heart disease of native coronary artery without angina pectoris: Secondary | ICD-10-CM | POA: Insufficient documentation

## 2019-11-27 DIAGNOSIS — N138 Other obstructive and reflux uropathy: Secondary | ICD-10-CM | POA: Diagnosis present

## 2019-11-27 DIAGNOSIS — I10 Essential (primary) hypertension: Secondary | ICD-10-CM | POA: Diagnosis not present

## 2019-11-27 DIAGNOSIS — N320 Bladder-neck obstruction: Secondary | ICD-10-CM | POA: Diagnosis not present

## 2019-11-27 DIAGNOSIS — Z8249 Family history of ischemic heart disease and other diseases of the circulatory system: Secondary | ICD-10-CM | POA: Insufficient documentation

## 2019-11-27 DIAGNOSIS — Z9842 Cataract extraction status, left eye: Secondary | ICD-10-CM | POA: Insufficient documentation

## 2019-11-27 DIAGNOSIS — N401 Enlarged prostate with lower urinary tract symptoms: Secondary | ICD-10-CM | POA: Diagnosis present

## 2019-11-27 DIAGNOSIS — N3281 Overactive bladder: Secondary | ICD-10-CM | POA: Insufficient documentation

## 2019-11-27 DIAGNOSIS — Z87442 Personal history of urinary calculi: Secondary | ICD-10-CM | POA: Insufficient documentation

## 2019-11-27 DIAGNOSIS — E785 Hyperlipidemia, unspecified: Secondary | ICD-10-CM | POA: Diagnosis not present

## 2019-11-27 DIAGNOSIS — R351 Nocturia: Secondary | ICD-10-CM | POA: Diagnosis not present

## 2019-11-27 DIAGNOSIS — Z820 Family history of epilepsy and other diseases of the nervous system: Secondary | ICD-10-CM | POA: Insufficient documentation

## 2019-11-27 DIAGNOSIS — Z961 Presence of intraocular lens: Secondary | ICD-10-CM | POA: Diagnosis not present

## 2019-11-27 DIAGNOSIS — R350 Frequency of micturition: Secondary | ICD-10-CM | POA: Insufficient documentation

## 2019-11-27 HISTORY — DX: Personal history of urinary calculi: Z87.442

## 2019-11-27 HISTORY — PX: TRANSURETHRAL RESECTION OF PROSTATE: SHX73

## 2019-11-27 HISTORY — DX: Complete loss of teeth, unspecified cause, unspecified class: Z97.2

## 2019-11-27 HISTORY — DX: Type 2 diabetes mellitus without complications: E11.9

## 2019-11-27 HISTORY — DX: Calculus in bladder: N21.0

## 2019-11-27 HISTORY — PX: CYSTOSCOPY WITH LITHOLAPAXY: SHX1425

## 2019-11-27 HISTORY — DX: Gastro-esophageal reflux disease without esophagitis: K21.9

## 2019-11-27 HISTORY — DX: Personal history of other diseases of the circulatory system: Z86.79

## 2019-11-27 HISTORY — DX: Benign prostatic hyperplasia with lower urinary tract symptoms: N40.1

## 2019-11-27 HISTORY — DX: Hematuria, unspecified: R31.9

## 2019-11-27 HISTORY — DX: Complete loss of teeth, unspecified cause, unspecified class: K08.109

## 2019-11-27 HISTORY — DX: Atherosclerotic heart disease of native coronary artery without angina pectoris: I25.10

## 2019-11-27 LAB — GLUCOSE, CAPILLARY: Glucose-Capillary: 123 mg/dL — ABNORMAL HIGH (ref 70–99)

## 2019-11-27 LAB — POCT I-STAT, CHEM 8
BUN: 22 mg/dL (ref 8–23)
Calcium, Ion: 1.33 mmol/L (ref 1.15–1.40)
Chloride: 105 mmol/L (ref 98–111)
Creatinine, Ser: 1 mg/dL (ref 0.61–1.24)
Glucose, Bld: 118 mg/dL — ABNORMAL HIGH (ref 70–99)
HCT: 39 % (ref 39.0–52.0)
Hemoglobin: 13.3 g/dL (ref 13.0–17.0)
Potassium: 3.8 mmol/L (ref 3.5–5.1)
Sodium: 140 mmol/L (ref 135–145)
TCO2: 25 mmol/L (ref 22–32)

## 2019-11-27 SURGERY — TURP (TRANSURETHRAL RESECTION OF PROSTATE)
Anesthesia: General | Site: Prostate

## 2019-11-27 MED ORDER — HYDROCODONE-ACETAMINOPHEN 5-325 MG PO TABS: 1.0000 | ORAL_TABLET | ORAL | Status: DC | PRN

## 2019-11-27 MED ORDER — FENTANYL CITRATE (PF) 100 MCG/2ML IJ SOLN: 25.0000 ug | INTRAMUSCULAR | Status: DC | PRN

## 2019-11-27 MED ORDER — PHENYLEPHRINE 40 MCG/ML (10ML) SYRINGE FOR IV PUSH (FOR BLOOD PRESSURE SUPPORT)
PREFILLED_SYRINGE | INTRAVENOUS | Status: AC
  Filled 2019-11-27: qty 10

## 2019-11-27 MED ORDER — CEPHALEXIN 500 MG PO CAPS
500.0000 mg | ORAL_CAPSULE | Freq: Two times a day (BID) | ORAL | Status: DC
  Administered 2019-11-27 (×2): 500 mg via ORAL
  Filled 2019-11-27 (×2): qty 1

## 2019-11-27 MED ORDER — ACETAMINOPHEN 325 MG PO TABS: 650.0000 mg | ORAL_TABLET | ORAL | Status: DC | PRN

## 2019-11-27 MED ORDER — METFORMIN HCL 500 MG PO TABS
500.0000 mg | ORAL_TABLET | Freq: Every day | ORAL | Status: DC
  Filled 2019-11-27: qty 1

## 2019-11-27 MED ORDER — FENTANYL CITRATE (PF) 100 MCG/2ML IJ SOLN
INTRAMUSCULAR | Status: AC
  Filled 2019-11-27: qty 2

## 2019-11-27 MED ORDER — BELLADONNA ALKALOIDS-OPIUM 16.2-60 MG RE SUPP
RECTAL | Status: AC
  Filled 2019-11-27: qty 1

## 2019-11-27 MED ORDER — BELLADONNA ALKALOIDS-OPIUM 16.2-60 MG RE SUPP: 1.0000 | Freq: Four times a day (QID) | RECTAL | Status: DC | PRN

## 2019-11-27 MED ORDER — DIPHENHYDRAMINE HCL 12.5 MG/5ML PO ELIX: 12.5000 mg | ORAL_SOLUTION | Freq: Four times a day (QID) | ORAL | Status: DC | PRN

## 2019-11-27 MED ORDER — SODIUM CHLORIDE 0.9 % IV SOLN: INTRAVENOUS | Status: DC

## 2019-11-27 MED ORDER — DIPHENHYDRAMINE HCL 50 MG/ML IJ SOLN: 12.5000 mg | Freq: Four times a day (QID) | INTRAMUSCULAR | Status: DC | PRN

## 2019-11-27 MED ORDER — SODIUM CHLORIDE 0.9 % IR SOLN
Status: DC | PRN
  Administered 2019-11-27 (×3): 6000 mL via INTRAVESICAL
  Administered 2019-11-27: 3000 mL via INTRAVESICAL
  Administered 2019-11-27 (×2): 6000 mL via INTRAVESICAL

## 2019-11-27 MED ORDER — DEXAMETHASONE SODIUM PHOSPHATE 10 MG/ML IJ SOLN
INTRAMUSCULAR | Status: AC
  Filled 2019-11-27: qty 1

## 2019-11-27 MED ORDER — CEFAZOLIN SODIUM-DEXTROSE 2-4 GM/100ML-% IV SOLN
INTRAVENOUS | Status: AC
  Filled 2019-11-27: qty 100

## 2019-11-27 MED ORDER — OXYBUTYNIN CHLORIDE 5 MG PO TABS: 5.0000 mg | ORAL_TABLET | Freq: Three times a day (TID) | ORAL | Status: DC | PRN

## 2019-11-27 MED ORDER — ONDANSETRON HCL 4 MG/2ML IJ SOLN
INTRAMUSCULAR | Status: AC
  Filled 2019-11-27: qty 2

## 2019-11-27 MED ORDER — DEXAMETHASONE SODIUM PHOSPHATE 10 MG/ML IJ SOLN
INTRAMUSCULAR | Status: DC | PRN
  Administered 2019-11-27: 10 mg via INTRAVENOUS

## 2019-11-27 MED ORDER — EPHEDRINE 5 MG/ML INJ
INTRAVENOUS | Status: AC
  Filled 2019-11-27: qty 10

## 2019-11-27 MED ORDER — ONDANSETRON HCL 4 MG/2ML IJ SOLN: 4.0000 mg | INTRAMUSCULAR | Status: DC | PRN

## 2019-11-27 MED ORDER — ONDANSETRON HCL 4 MG/2ML IJ SOLN: 4.0000 mg | Freq: Once | INTRAMUSCULAR | Status: DC | PRN

## 2019-11-27 MED ORDER — BELLADONNA ALKALOIDS-OPIUM 16.2-60 MG RE SUPP
RECTAL | Status: DC | PRN
  Administered 2019-11-27: 1 via RECTAL

## 2019-11-27 MED ORDER — AMLODIPINE BESYLATE 10 MG PO TABS
10.0000 mg | ORAL_TABLET | Freq: Every day | ORAL | Status: DC
  Filled 2019-11-27: qty 1

## 2019-11-27 MED ORDER — MORPHINE SULFATE (PF) 4 MG/ML IV SOLN: 2.0000 mg | INTRAVENOUS | Status: DC | PRN

## 2019-11-27 MED ORDER — ONDANSETRON HCL 4 MG/2ML IJ SOLN
INTRAMUSCULAR | Status: DC | PRN
  Administered 2019-11-27: 4 mg via INTRAVENOUS

## 2019-11-27 MED ORDER — SODIUM CHLORIDE 0.9 % IR SOLN
3000.0000 mL | Status: DC
  Administered 2019-11-27 (×3): 3000 mL

## 2019-11-27 MED ORDER — METFORMIN HCL 500 MG PO TABS
500.0000 mg | ORAL_TABLET | Freq: Every day | ORAL | Status: DC
  Administered 2019-11-27 – 2019-11-28 (×2): 500 mg via ORAL
  Filled 2019-11-27 (×2): qty 1

## 2019-11-27 MED ORDER — ATORVASTATIN CALCIUM 80 MG PO TABS
80.0000 mg | ORAL_TABLET | Freq: Every day | ORAL | Status: DC
  Administered 2019-11-27: 18:00:00 80 mg via ORAL
  Filled 2019-11-27 (×2): qty 1

## 2019-11-27 MED ORDER — PROPOFOL 10 MG/ML IV BOLUS
INTRAVENOUS | Status: AC
  Filled 2019-11-27: qty 20

## 2019-11-27 MED ORDER — LISINOPRIL 20 MG PO TABS
40.0000 mg | ORAL_TABLET | Freq: Every day | ORAL | Status: DC
  Administered 2019-11-28: 40 mg via ORAL
  Filled 2019-11-27: qty 1

## 2019-11-27 MED ORDER — TAMSULOSIN HCL 0.4 MG PO CAPS: 0.4000 mg | ORAL_CAPSULE | Freq: Every day | ORAL | Status: DC

## 2019-11-27 MED ORDER — OXYCODONE HCL 5 MG/5ML PO SOLN: 5.0000 mg | Freq: Once | ORAL | Status: DC | PRN

## 2019-11-27 MED ORDER — LIDOCAINE 2% (20 MG/ML) 5 ML SYRINGE
INTRAMUSCULAR | Status: DC | PRN
  Administered 2019-11-27: 80 mg via INTRAVENOUS

## 2019-11-27 MED ORDER — PROPOFOL 10 MG/ML IV BOLUS
INTRAVENOUS | Status: DC | PRN
  Administered 2019-11-27: 160 mg via INTRAVENOUS

## 2019-11-27 MED ORDER — OXYCODONE HCL 5 MG PO TABS: 5.0000 mg | ORAL_TABLET | Freq: Once | ORAL | Status: DC | PRN

## 2019-11-27 MED ORDER — METOPROLOL TARTRATE 25 MG PO TABS
12.5000 mg | ORAL_TABLET | Freq: Two times a day (BID) | ORAL | Status: DC
  Administered 2019-11-27: 12.5 mg via ORAL

## 2019-11-27 MED ORDER — PHENYLEPHRINE 40 MCG/ML (10ML) SYRINGE FOR IV PUSH (FOR BLOOD PRESSURE SUPPORT)
PREFILLED_SYRINGE | INTRAVENOUS | Status: DC | PRN
  Administered 2019-11-27: 80 ug via INTRAVENOUS

## 2019-11-27 MED ORDER — CEFAZOLIN SODIUM-DEXTROSE 2-4 GM/100ML-% IV SOLN
2.0000 g | Freq: Once | INTRAVENOUS | Status: AC
  Administered 2019-11-27: 2 g via INTRAVENOUS

## 2019-11-27 MED ORDER — FENTANYL CITRATE (PF) 100 MCG/2ML IJ SOLN
INTRAMUSCULAR | Status: DC | PRN
  Administered 2019-11-27: 50 ug via INTRAVENOUS
  Administered 2019-11-27 (×2): 25 ug via INTRAVENOUS
  Administered 2019-11-27 (×2): 50 ug via INTRAVENOUS

## 2019-11-27 MED ORDER — EPHEDRINE SULFATE-NACL 50-0.9 MG/10ML-% IV SOSY
PREFILLED_SYRINGE | INTRAVENOUS | Status: DC | PRN
  Administered 2019-11-27 (×3): 10 mg via INTRAVENOUS

## 2019-11-27 MED ORDER — LACTATED RINGERS IV SOLN: INTRAVENOUS | Status: DC

## 2019-11-27 MED ORDER — METOPROLOL TARTRATE 25 MG PO TABS
ORAL_TABLET | ORAL | Status: AC
  Filled 2019-11-27: qty 1

## 2019-11-27 MED ORDER — LIDOCAINE 2% (20 MG/ML) 5 ML SYRINGE
INTRAMUSCULAR | Status: AC
  Filled 2019-11-27: qty 5

## 2019-11-27 MED ORDER — QUINAPRIL HCL 10 MG PO TABS
40.0000 mg | ORAL_TABLET | Freq: Every day | ORAL | Status: DC
  Filled 2019-11-27: qty 4

## 2019-11-27 SURGICAL SUPPLY — 29 items
BAG DRAIN URO-CYSTO SKYTR STRL (DRAIN) ×3 IMPLANT
BAG DRN RND TRDRP ANRFLXCHMBR (UROLOGICAL SUPPLIES) ×2
BAG DRN UROCATH (DRAIN) ×2
BAG URINE DRAIN 2000ML AR STRL (UROLOGICAL SUPPLIES) ×3 IMPLANT
BALLN NEPHROSTOMY (BALLOONS)
BALLOON NEPHROSTOMY (BALLOONS) ×1 IMPLANT
BAND INSRT 18 STRL LF DISP RB (MISCELLANEOUS) ×2
BAND RUBBER #18 3X1/16 STRL (MISCELLANEOUS) ×2 IMPLANT
CATH FOLEY 3WAY 30CC 22F (CATHETERS) ×2 IMPLANT
CATH FOLEY 3WAY 30CC 24FR (CATHETERS)
CATH URTH STD 24FR FL 3W 2 (CATHETERS) IMPLANT
CLOTH BEACON ORANGE TIMEOUT ST (SAFETY) ×3 IMPLANT
FIBER LASER FLEXIVA 1000 (UROLOGICAL SUPPLIES) ×3 IMPLANT
FIBER LASER FLEXIVA 550 (UROLOGICAL SUPPLIES) ×1 IMPLANT
GLOVE BIO SURGEON STRL SZ7.5 (GLOVE) ×3 IMPLANT
GOWN STRL REUS W/TWL XL LVL3 (GOWN DISPOSABLE) ×3 IMPLANT
HOLDER FOLEY CATH W/STRAP (MISCELLANEOUS) ×1 IMPLANT
IV NS IRRIG 3000ML ARTHROMATIC (IV SOLUTION) ×22 IMPLANT
LOOP CUT BIPOLAR 24F LRG (ELECTROSURGICAL) ×2 IMPLANT
MANIFOLD NEPTUNE II (INSTRUMENTS) ×3 IMPLANT
NS IRRIG 1000ML POUR BTL (IV SOLUTION) ×1 IMPLANT
PIN SAFETY STERILE (MISCELLANEOUS) ×3 IMPLANT
PLUG CATH AND CAP STER (CATHETERS) ×1 IMPLANT
SYR 30ML LL (SYRINGE) ×3 IMPLANT
SYR TOOMEY IRRIG 70ML (MISCELLANEOUS) ×3
SYRINGE TOOMEY IRRIG 70ML (MISCELLANEOUS) ×2 IMPLANT
TRAY CYSTO PACK (CUSTOM PROCEDURE TRAY) ×3 IMPLANT
TUBE CONNECTING 12X1/4 (SUCTIONS) ×3 IMPLANT
TUBING UROLOGY SET (TUBING) ×3 IMPLANT

## 2019-11-27 NOTE — Transfer of Care (Signed)
Immediate Anesthesia Transfer of Care Note  Patient: [name] [facility]  Procedure(s) Performed: TRANSURETHRAL RESECTION OF THE PROSTATE (TURP), BIPOLAR (N/A Prostate) CYSTOSCOPY WITH LITHOLAPAXY (N/A Bladder)  Patient Location: PACU  Anesthesia Type:General  Level of Consciousness: awake, alert  and oriented  Airway & Oxygen Therapy: Patient Spontanous Breathing and Patient connected to nasal cannula oxygen  Post-op Assessment: Report given to RN and Post -op Vital signs reviewed and stable  Post vital signs: Reviewed and stable  Last Vitals:  Vitals Value Taken Time  BP    Temp    Pulse    Resp    SpO2      Last Pain:  Vitals:   11/27/19 0653  TempSrc: Oral  PainSc: 0-No pain      Patients Stated Pain Goal: 5 (11/27/19 1994)  Complications: No apparent anesthesia complications

## 2019-11-27 NOTE — Op Note (Signed)
Operative Note  Preoperative diagnosis:  1.  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  Numerous 1 cm bladder stones  Postoperative diagnosis: Same  Procedure(s): 1.  Cystoscopy with cystolitholapaxy and bipolar TURP  Surgeon: Rhoderick Moody, MD  Assistants:  None  Anesthesia:  General  Complications:  None  EBL: 50 mL  Specimens: 1.  Bladder stones 2.  Prostate chips  Drains/Catheters: 1.  22 French three-way Foley catheter with 30 mL in the balloon  Intraoperative findings:   1. Numerous 1 cm bladder stones 2. Prominent lateral lobes of the prostate with bladder outlet obstruction 3. Moderate bladder trabeculation 4. No bladder tumors or other intravesical lesions were noted on cystoscopy  Indication:  [name] is a 79 y.o. male with a history of BPH with lower urinary tract symptoms and microscopic hematuria.  CT urogram from 09/11/2019 revealed numerous bladder stones as well as a prominent prostate.  He has been consented for the above procedures, voices understanding and wishes to proceed.  Description of procedure:  After informed consent was obtained, the patient was brought to the operating room and general LMA anesthesia was administered. The patient was then placed in the dorsolithotomy position and prepped and draped in the usual sterile fashion. A timeout was performed. A 23 French rigid cystoscope was then inserted into the urethral meatus and advanced into the bladder under direct vision. A complete bladder survey revealed numerous 1 cm bladder stones with no other intravesical pathology  The 23 French rigid cystoscope with an exchanged for a 64 French resectoscope with a laser bridge.  A 1000 W laser was then used to fracture his numerous stones into smaller pieces that were then siphoned out of the bladder through the sheath of the resectoscope.  Once all stone fragments were removed, the laser bridge was exchanged  for a bipolar loop working element.  Starting at the bladder neck and progressing down to the verumontanum, the lateral lobes of his prostate were then resected down until an adequate prostatic urethral channel was created.  All prostate chips were then evacuated out of the bladder through the sheath of the resectoscope.  The area of resection within the prostatic urethra was then extensively fulgurated until hemostasis was achieved.  A 22 French three-way Foley catheter was then inserted and the balloon was inflated with 30 mL.  The catheter was then extensively hand irrigated, assuring no clots or prostate chips were left within the lumen of the bladder.  The catheter was then placed to continuous bladder irrigation and light traction.  The patient was awoken from anesthesia having tolerated the procedure well and transferred to the postanesthesia unit in stable condition.  Plan: CBI and traction overnight

## 2019-11-27 NOTE — Anesthesia Postprocedure Evaluation (Signed)
Anesthesia Post Note  Patient: Marquinn Meschke Hoshino  Procedure(s) Performed: TRANSURETHRAL RESECTION OF THE PROSTATE (TURP), BIPOLAR (N/A Prostate) CYSTOSCOPY WITH LITHOLAPAXY (N/A Bladder)     Patient location during evaluation: PACU Anesthesia Type: General Level of consciousness: awake and alert and oriented Pain management: pain level controlled Vital Signs Assessment: post-procedure vital signs reviewed and stable Respiratory status: spontaneous breathing, nonlabored ventilation and respiratory function stable Cardiovascular status: blood pressure returned to baseline and stable Postop Assessment: no apparent nausea or vomiting Anesthetic complications: no    Last Vitals:  Vitals:   11/27/19 1100 11/27/19 1115  BP:  114/79  Pulse: 62 67  Resp:  14  Temp:  36.6 C  SpO2:  100%    Last Pain:  Vitals:   11/27/19 1115  TempSrc:   PainSc: 0-No pain                 Tawanna Funk A.

## 2019-11-27 NOTE — Progress Notes (Signed)
Patient up to bathroom then sat on the edge of bed for 30 minutes.

## 2019-11-27 NOTE — H&P (Signed)
Urology Preoperative H&P   Chief Complaint:  Urinary urgency and bladder stones  History of Present Illness: [name] is a 79 y.o. male with a history of BPH and microscopic hematuria. He was initially seen in consultation by Dr. McDiarmid. Hx of A-fib and CABG in 2016--not currently on anti-platelet or anti-coagulant medication   1. BPH- The patient continues Tamsulosin and Myrbetriq for management of overactive bladder symptoms. Voiding symptoms remain stable. At baseline he has frequency/urgency with some mild obstructive symptoms during the daytime. Nocturia stable x3. He has had no interval burning or painful urination, interval stone material passage, visible blood in the urine, UTI treatment. No changes in past medical or surgical history since last evaluation. Denies interval fevers or chills, nausea/vomiting.  2. Hematuria- CT urogram from 09/11/2019 revealed numerous bladder stones as well as a prominent prostate. No upper tract lesions or kidney stones noted.   Last PSA- 1.96 (09/2019)  Past Medical History:  Diagnosis Date  . Benign localized prostatic hyperplasia with lower urinary tract symptoms (LUTS)   . Bladder stones   . Coronary artery disease cardiologist--- dr Raliegh Ip. nelson   hx NSTEMI w/ severe 3V,  12-05-2014 s/p CABG x5  . Full dentures   . GERD (gastroesophageal reflux disease)   . Hematuria   . History of atrial fibrillation    post op CABG 12-05-2014  . History of kidney stones   . History of non-ST elevation myocardial infarction (NSTEMI) 11/30/2014  . Hyperlipidemia   . Hypertension    followed by cardiologist  . 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 Type 2 diabetes mellitus ([place])    folllowed by pcp   (11-20-2019  pt stated he check blood sugar daily in am,  fasting sugar--- 110 - 1025    Past Surgical History:  Procedure Laterality Date   . CARDIAC CATHETERIZATION N/A 12/01/2014   Procedure: Left Heart Cath and Coronary Angiography;  Surgeon: Sherren Mocha, MD;  Location: [facility];  Service: Cardiovascular;  Laterality: N/A;  . CATARACT EXTRACTION W/ INTRAOCULAR LENS  IMPLANT, BILATERAL  07-30-2019 and 08-24-2019  . CORONARY ARTERY BYPASS GRAFT N/A 12/05/2014   Procedure: CORONARY ARTERY BYPASS GRAFTING (CABG), ON PUMP, TIMES FIVE, USING LEFT INTERNAL MAMMARY ARTERY, BILATERAL GREATER SAPHENOUS VEIN HARVESTED ENDOSCOPICALLY;  Surgeon: Rexene Alberts, MD;  Location: [facility];  Service: Open Heart Surgery;  Laterality: N/A;  LIMA-LAD; SEQ SVG-OM1-OM2; SVG-DIAG; SVG-PD  . ROTATOR CUFF REPAIR Left 05/1994  . TEE WITHOUT CARDIOVERSION N/A 12/05/2014   Procedure: TRANSESOPHAGEAL ECHOCARDIOGRAM (TEE);  Surgeon: Rexene Alberts, MD;  Location: [facility];  Service: Open Heart Surgery;  Laterality: N/A;    Allergies: No Known Allergies  Family History  Problem Relation Age of Onset  . Heart disease Mother   . Heart attack Mother   . Alzheimer's disease Father   . Heart disease Brother   . Alzheimer's disease Brother     Social History:  reports that he quit smoking about 25 years ago. His smoking use included cigarettes. He quit after 20.00 years of use. He has never used smokeless tobacco. He reports current alcohol use. He reports that he does not use drugs.  ROS: A complete review of systems was performed.  All systems are negative except for pertinent findings as noted.  Physical Exam:  Vital signs in last 24 hours:  Constitutional:  Alert and oriented, No acute distress Cardiovascular: Regular rate and rhythm, No JVD Respiratory: Normal respiratory effort, Lungs clear bilaterally GI: Abdomen is soft, nontender, nondistended, no abdominal masses GU: No CVA tenderness Lymphatic: No lymphadenopathy Neurologic: Grossly intact, no focal deficits Psychiatric: Normal mood and affect  Laboratory Data:  No results for  input(s): WBC, HGB, HCT, PLT in the last 72 hours.  No results for input(s): NA, K, CL, GLUCOSE, BUN, CALCIUM, CREATININE in the last 72 hours.  Invalid input(s): CO3   No results found for this or any previous visit (from the past 24 hour(s)). Recent Results (from the past 240 hour(s))  SARS CORONAVIRUS 2 (TAT 6-24 HRS) Nasopharyngeal Nasopharyngeal Swab     Status: None   Collection Time: 11/23/19  1:21 PM   Specimen: Nasopharyngeal Swab  Result Value Ref Range Status   SARS Coronavirus 2 NEGATIVE NEGATIVE Final    Comment: (NOTE) SARS-CoV-2 target nucleic acids are NOT DETECTED. The SARS-CoV-2 RNA is generally detectable in upper and lower respiratory specimens during the acute phase of infection. Negative results do not preclude SARS-CoV-2 infection, do not rule out co-infections with other pathogens, and should not be used as the sole basis for treatment or other patient management decisions. Negative results must be combined with clinical observations, patient history, and epidemiological information. The expected result is Negative. Fact Sheet for Patients: HairSlick.no Fact Sheet for Healthcare Providers: quierodirigir.com This test is not yet approved or cleared by the [place] FDA and  has been authorized for detection and/or diagnosis of SARS-CoV-2 by FDA under an Emergency Use Authorization (EUA). This EUA will remain  in effect (meaning this test can be used) for the duration of the COVID-19 declaration under Section 56 4(b)(1) of the Act, 21 U.S.C. section 360bbb-3(b)(1), unless the authorization is terminated or revoked sooner. Performed at [facility], [address] [place], [place] [address]     Renal Function: No results for input(s): CREATININE in the last 168 hours. CrCl cannot be calculated (Patient's most recent lab result is older than the maximum 21 days allowed.).  Radiologic  Imaging: No results found.  I independently reviewed the above imaging studies.  Assessment and Plan [name] is a 79 y.o. male with BPH w/ LUTS and numerous bladder stones.   -The risks, benefits and alternatives of cystoscopy with TURP and cystolithalopaxy was discussed with the patient. The risks included, but are not limited to, bleeding, urinary tract infection, bladder perforation requiring prolonged catheterization and/or open bladder repair, ureteral injury, ureteral obstruction, urethral stricture disease, new or worsening voiding dysfunction, retrograde ejaculation, MI, CVA, PE, DVT and the inherent risks of general anesthesia. We also discussed the need for Foley catheterization for at least 3 days post-op and the likely need for post-op observation in the hospital following the procedure. The patient voices understanding and wishes to proceed.   Rhoderick Moody, MD 11/27/2019, 6:17 AM  [facility] Pager: [phone]

## 2019-11-27 NOTE — Anesthesia Procedure Notes (Signed)
Procedure Name: LMA Insertion Date/Time: 11/27/2019 8:28 AM Performed by: Dayton Sherr D, CRNA Pre-anesthesia Checklist: Patient identified, Emergency Drugs available, Suction available and Patient being monitored Patient Re-evaluated:Patient Re-evaluated prior to induction Oxygen Delivery Method: Circle system utilized Preoxygenation: Pre-oxygenation with 100% oxygen Induction Type: IV induction Ventilation: Mask ventilation without difficulty LMA: LMA inserted LMA Size: 5.0 Tube type: Oral Number of attempts: 1 Placement Confirmation: positive ETCO2 and breath sounds checked- equal and bilateral Tube secured with: Tape Dental Injury: Teeth and Oropharynx as per pre-operative assessment

## 2019-11-28 DIAGNOSIS — N210 Calculus in bladder: Secondary | ICD-10-CM | POA: Diagnosis not present

## 2019-11-28 LAB — BASIC METABOLIC PANEL
Anion gap: 12 (ref 5–15)
BUN: 19 mg/dL (ref 8–23)
CO2: 22 mmol/L (ref 22–32)
Calcium: 9.5 mg/dL (ref 8.9–10.3)
Chloride: 107 mmol/L (ref 98–111)
Creatinine, Ser: 0.82 mg/dL (ref 0.61–1.24)
GFR calc Af Amer: >60 mL/min (ref >60)
GFR calc non Af Amer: >60 mL/min (ref >60)
Glucose, Bld: 131 mg/dL — ABNORMAL HIGH (ref 70–99)
Potassium: 4.7 mmol/L (ref 3.5–5.1)
Sodium: 141 mmol/L (ref 135–145)

## 2019-11-28 LAB — HEMOGLOBIN AND HEMATOCRIT, BLOOD
HCT: 36.2 % — ABNORMAL LOW (ref 39.0–52.0)
Hemoglobin: 12.3 g/dL — ABNORMAL LOW (ref 13.0–17.0)

## 2019-11-28 LAB — SURGICAL PATHOLOGY

## 2019-11-28 MED ORDER — CEPHALEXIN 500 MG PO CAPS
500.0000 mg | ORAL_CAPSULE | Freq: Two times a day (BID) | ORAL | 0 refills | Status: AC
Start: 2019-11-28 — End: 2019-12-01

## 2019-11-28 MED ORDER — PHENAZOPYRIDINE HCL 200 MG PO TABS
200.0000 mg | ORAL_TABLET | Freq: Three times a day (TID) | ORAL | 0 refills | Status: AC | PRN
Start: 2019-11-28 — End: 2020-11-27

## 2019-11-28 MED ORDER — TRAMADOL HCL 50 MG PO TABS: 50.0000 mg | ORAL_TABLET | Freq: Four times a day (QID) | ORAL | 0 refills | Status: AC | PRN

## 2019-11-28 NOTE — Discharge Summary (Signed)
Date of admission: 11/27/2019  Date of discharge: 11/28/2019  Admission diagnosis: 1.  BPH w/ LUTS 2.  Numerous bladder stones  Discharge diagnosis: Same  Surgical procedures:  Cystolithalopaxy and bipolar TURP  History and Physical: For full details, please see admission history and physical. Briefly, [name] is a 79 y.o. year old patient with a history of BPH with LUTS and numerous bladder stones seen on CT.  He underwent the above procedures on 11/27/19.   Hospital Course: The patient was monitored on the floor following surgery and had a routine post-op course.  He was discharged home with his Foley catheter with plans for a voiding trial on 12/02/19.  Physical Exam:  General: Alert and oriented CV: RRR, palpable distal pulses Lungs: CTAB, equal chest rise Abdomen: Soft, NTND, no rebound or guarding GU:  Foley in place and draining light pink urine w/o CBI Ext: NT, No erythema  Laboratory values:  Recent Labs    11/27/19 0729 11/28/19 0450  HGB 13.3 12.3*  HCT 39.0 36.2*   Recent Labs    11/27/19 0729 11/28/19 0450  CREATININE 1.00 0.82    Disposition: Home  Discharge instruction: The patient was instructed to be ambulatory but told to refrain from heavy lifting, strenuous activity, or driving.  Discharge medications:  Allergies as of 11/28/2019   No Known Allergies     Medication List    STOP taking these medications   aspirin EC 81 MG tablet     TAKE these medications   amLODipine 10 MG tablet Commonly known as: NORVASC Take 1 tablet (10 mg total) by mouth daily.   atorvastatin 80 MG tablet Commonly known as: LIPITOR Take 1 tablet (80 mg total) by mouth daily at 6 PM.   cephALEXin 500 MG capsule Commonly known as: KEFLEX Take 1 capsule (500 mg total) by mouth 2 (two) times daily for 3 days. Notes to patient: Start Monday 5/16 and take 5/17, and 5/18   metFORMIN 500 MG tablet Commonly known as: GLUCOPHAGE Take 500 mg by mouth daily with  breakfast. Notes to patient: Took this morning 5/13- take tomorrow morning   metoprolol tartrate 25 MG tablet Commonly known as: LOPRESSOR Take 0.5 tablets (12.5 mg total) by mouth 2 (two) times daily.   Myrbetriq 50 MG Tb24 tablet Generic drug: mirabegron ER Take 50 mg by mouth daily.   OMEGA 3 PO Take 2 tablets by mouth daily.   phenazopyridine 200 MG tablet Commonly known as: Pyridium Take 1 tablet (200 mg total) by mouth 3 (three) times daily as needed (for pain with urination).   Prodigy Twist Top Lancets 28G Misc   quinapril 40 MG tablet Commonly known as: ACCUPRIL Take 40 mg by mouth daily. Notes to patient: Took this morning 5/13, take next dose tomorrow morning   tamsulosin 0.4 MG Caps capsule Commonly known as: FLOMAX Take 0.4 mg by mouth daily.   traMADol 50 MG tablet Commonly known as: Ultram Take 1 tablet (50 mg total) by mouth every 6 (six) hours as needed for up to 3 days.       Followup:  Follow-up Information    [facility] On 12/02/2019.   Why: Follow-up at 8 AM for catheter removal Contact information: [address] [place] [address] [phone]

## 2019-11-28 NOTE — Discharge Instructions (Signed)
Indwelling Urinary Catheter Care, Adult An indwelling urinary catheter is a thin tube that is put into your bladder. The tube helps to drain pee (urine) out of your body. The tube goes in through your urethra. Your urethra is where pee comes out of your body. Your pee will come out through the catheter, then it will go into a bag (drainage bag). Take good care of your catheter so it will work well. How to wear your catheter and bag Supplies needed  Sticky tape (adhesive tape) or a leg strap.  Alcohol wipe or soap and water (if you use tape).  A clean towel (if you use tape).  Large overnight bag.  Smaller bag (leg bag). Wearing your catheter Attach your catheter to your leg with tape or a leg strap.  Make sure the catheter is not pulled tight.  If a leg strap gets wet, take it off and put on a dry strap.  If you use tape to hold the bag on your leg: 1. Use an alcohol wipe or soap and water to wash your skin where the tape made it sticky before. 2. Use a clean towel to pat-dry that skin. 3. Use new tape to make the bag stay on your leg. Wearing your bags You should have been given a large overnight bag.  You may wear the overnight bag in the day or night.  Always have the overnight bag lower than your bladder.  Do not let the bag touch the floor.  Before you go to sleep, put a clean plastic bag in a wastebasket. Then hang the overnight bag inside the wastebasket. You should also have a smaller leg bag that fits under your clothes.  Always wear the leg bag below your knee.  Do not wear your leg bag at night. How to care for your skin and catheter Supplies needed  A clean washcloth.  Water and mild soap.  A clean towel. Caring for your skin and catheter      Clean the skin around your catheter every day: 1. Wash your hands with soap and water. 2. Wet a clean washcloth in warm water and mild soap. 3. Clean the skin around your urethra.  If you are  male:  Gently spread the folds of skin around your vagina (labia).  With the washcloth in your other hand, wipe the inner side of your labia on each side. Wipe from front to back.  If you are male:  Pull back any skin that covers the end of your penis (foreskin).  With the washcloth in your other hand, wipe your penis in small circles. Start wiping at the tip of your penis, then move away from the catheter.  Move the foreskin back in place, if needed. 4. With your free hand, hold the catheter close to where it goes into your body.  Keep holding the catheter during cleaning so it does not get pulled out. 5. With the washcloth in your other hand, clean the catheter.  Only wipe downward on the catheter.  Do not wipe upward toward your body. Doing this may push germs into your urethra and cause infection. 6. Use a clean towel to pat-dry the catheter and the skin around it. Make sure to wipe off all soap. 7. Wash your hands with soap and water.  Shower every day. Do not take baths.  Do not use cream, ointment, or lotion on the area where the catheter goes into your body, unless your doctor tells you   to.  Do not use powders, sprays, or lotions on your genital area.  Check your skin around the catheter every day for signs of infection. Check for: ? Redness, swelling, or pain. ? Fluid or blood. ? Warmth. ? Pus or a bad smell. How to empty the bag Supplies needed  Rubbing alcohol.  Gauze pad or cotton ball.  Tape or a leg strap. Emptying the bag Pour the pee out of your bag when it is ?- full, or at least 2-3 times a day. Do this for your overnight bag and your leg bag. 1. Wash your hands with soap and water. 2. Separate (detach) the bag from your leg. 3. Hold the bag over the toilet or a clean pail. Keep the bag lower than your hips and bladder. This is so the pee (urine) does not go back into the tube. 4. Open the pour spout. It is at the bottom of the bag. 5. Empty the  pee into the toilet or pail. Do not let the pour spout touch any surface. 6. Put rubbing alcohol on a gauze pad or cotton ball. 7. Use the gauze pad or cotton ball to clean the pour spout. 8. Close the pour spout. 9. Attach the bag to your leg with tape or a leg strap. 10. Wash your hands with soap and water. Follow instructions for cleaning the drainage bag:  From the product maker.  As told by your doctor. How to change the bag Supplies needed  Alcohol wipes.  A clean bag.  Tape or a leg strap. Changing the bag Replace your bag when it starts to leak, smell bad, or look dirty. 1. Wash your hands with soap and water. 2. Separate the dirty bag from your leg. 3. Pinch the catheter with your fingers so that pee does not spill out. 4. Separate the catheter tube from the bag tube where these tubes connect (at the connection valve). Do not let the tubes touch any surface. 5. Clean the end of the catheter tube with an alcohol wipe. Use a different alcohol wipe to clean the end of the bag tube. 6. Connect the catheter tube to the tube of the clean bag. 7. Attach the clean bag to your leg with tape or a leg strap. Do not make the bag tight on your leg. 8. Wash your hands with soap and water. General rules   Never pull on your catheter. Never try to take it out. Doing that can hurt you.  Always wash your hands before and after you touch your catheter or bag. Use a mild, fragrance-free soap. If you do not have soap and water, use hand sanitizer.  Always make sure there are no twists or bends (kinks) in the catheter tube.  Always make sure there are no leaks in the catheter or bag.  Drink enough fluid to keep your pee pale yellow.  Do not take baths, swim, or use a hot tub.  If you are male, wipe from front to back after you poop (have a bowel movement). Contact a doctor if:  Your pee is cloudy.  Your pee smells worse than usual.  Your catheter gets clogged.  Your catheter  leaks.  Your bladder feels full. Get help right away if:  You have redness, swelling, or pain where the catheter goes into your body.  You have fluid, blood, pus, or a bad smell coming from the area where the catheter goes into your body.  Your skin feels warm where   the catheter goes into your body.  You have a fever.  You have pain in your: ? Belly (abdomen). ? Legs. ? Lower back. ? Bladder.  You see blood in the catheter.  Your pee is pink or red.  You feel sick to your stomach (nauseous).  You throw up (vomit).  You have chills.  Your pee is not draining into the bag.  Your catheter gets pulled out. Summary  An indwelling urinary catheter is a thin tube that is placed into the bladder to help drain pee (urine) out of the body.  The catheter is placed into the part of the body that drains pee from the bladder (urethra).  Taking good care of your catheter will keep it working properly and help prevent problems.  Always wash your hands before and after touching your catheter or bag.  Never pull on your catheter or try to take it out. This information is not intended to replace advice given to you by your health care provider. Make sure you discuss any questions you have with your health care provider. Document Revised: 10/26/2018 Document Reviewed: 02/17/2017 Elsevier Patient Education  2020 [employer]  

## 2019-11-28 NOTE — Progress Notes (Signed)
Traction removed from catheter

## 2021-04-08 ENCOUNTER — Other Ambulatory Visit: Payer: Self-pay | Admitting: Internal Medicine

## 2021-04-08 DIAGNOSIS — R7989 Other specified abnormal findings of blood chemistry: Secondary | ICD-10-CM

## 2021-04-14 ENCOUNTER — Ambulatory Visit
Admission: RE | Admit: 2021-04-14 | Discharge: 2021-04-14 | Disposition: A | Discharge status: Home / Self Care | Payer: Medicare Other | Source: Ambulatory Visit | Attending: Internal Medicine | Admitting: Internal Medicine

## 2021-04-14 DIAGNOSIS — R7989 Other specified abnormal findings of blood chemistry: Secondary | ICD-10-CM

## 2021-04-20 ENCOUNTER — Telehealth: Payer: Self-pay | Admitting: Physician Assistant

## 2021-04-20 NOTE — Telephone Encounter (Signed)
Scheduled appt per 10/3 referral. Pt is aware.

## 2021-04-21 ENCOUNTER — Other Ambulatory Visit: Payer: Self-pay | Admitting: Internal Medicine

## 2021-04-21 NOTE — Progress Notes (Signed)
[facility] [phone]   [phone]  CONSULT NOTE  REFERRING PHYSICIAN: Dr. Thornell Mule  REASON FOR CONSULTATION:  Hemochromatosis rule out  HPI [name] is a 80 y.o. male with a past medical history significant for diabetes, NSTEMI, hypertension, hyperlipidemia, and nephrolithiasis is referred to the clinic to rule out hemochromatosis.  The patient is being evaluated by his PCP due to elevated LFTs. He had routine lab work performed by his primary care doctor's office which showed elevated liver enzymes with an ALT of 57 on 04/02/21.  His work-up thus far included a ultrasound of the liver which showed increased echotexture likely related to diffuse hepatocellular disease such as fatty infiltrate.  His iron studies were within normal limits except his ferritin was elevated at 458.9. His iron studies were normal with total iron was 116. His saturation was 48%. His TIBC was slightly low at 241. He was tested for hepatitis C which was negative. It was thought his elevated LFTs were secondary to statin use but he was referred to the clinic to rule out possible hemochromatosis given the elevated ferritin.  Overall, the patient is feeling well today without any concerning complaints.  He is still very active with riding his bike 10 to 14 miles 2-4 times per week.  The patient states he "feels good".  He denies any weakness.  Denies any known skin hyperpigmentation.  Denies any abdominal fullness or bloating.  Denies any joint pain.  Denies any shortness of breath except for riding his bike on hills.  Denies any unusual fatigue.  He denies any vitamin supplements or iron supplements.  Denies any frequent or recent blood transfusions.  Denies excessive red meat consumption.  He estimates that he eats red meat 1-2 times per week.   The patient's family history consists of a mother who had breast cancer.  The patient's father had Alzheimer's and an aneurysm.  The  patient's brother has "heart problems".  The patient denies any family history of liver disease or iron storage disease.  The patient used to work as a Designer, industrial/product.  He is married and has 1 child and 1 stepdaughter.  The patient estimates that he smoked on and off for 37 years estimating less than 1 pack/day.  The patient denies heavy alcohol use and states that he can go several months without having an alcoholic beverage.  He denies any street drug use.  HPI  Past Medical History:  Diagnosis Date   Benign localized prostatic hyperplasia with lower urinary tract symptoms (LUTS)    Bladder stones    Coronary artery disease cardiologist--- dr Kirtland Bouchard. 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   LIMA to LAD, SVG to Diagonal Branch, sequential SVG to OM1-OM2, SVG to PDA, EVH from bilateral thighs and right lower leg   Type 2 diabetes mellitus (HCC)    folllowed by pcp   (11-20-2019  pt stated he check blood sugar daily in am,  fasting sugar--- 110 - 1025    Past Surgical History:  Procedure Laterality Date  CARDIAC CATHETERIZATION N/A 12/01/2014   Procedure: Left Heart Cath and Coronary Angiography;  Surgeon: Tonny Bollman, MD;  Location: [facility] INVASIVE CV LAB;  Service: Cardiovascular;  Laterality: N/A;   CATARACT EXTRACTION W/ INTRAOCULAR LENS  IMPLANT, BILATERAL  07-30-2019 and 08-24-2019   CORONARY ARTERY BYPASS GRAFT N/A 12/05/2014   Procedure: CORONARY ARTERY BYPASS GRAFTING (CABG), ON PUMP, TIMES FIVE, USING LEFT INTERNAL MAMMARY ARTERY, BILATERAL GREATER SAPHENOUS VEIN HARVESTED ENDOSCOPICALLY;  Surgeon: Purcell Nails, MD;  Location: [facility] OR;  Service:  Open Heart Surgery;  Laterality: N/A;  LIMA-LAD; SEQ SVG-OM1-OM2; SVG-DIAG; SVG-PD   CYSTOSCOPY WITH LITHOLAPAXY N/A 11/27/2019   Procedure: CYSTOSCOPY WITH LITHOLAPAXY;  Surgeon: Rene Paci, MD;  Location: [facility];  Service: Urology;  Laterality: N/A;   ROTATOR CUFF REPAIR Left 05/1994   TEE WITHOUT CARDIOVERSION N/A 12/05/2014   Procedure: TRANSESOPHAGEAL ECHOCARDIOGRAM (TEE);  Surgeon: Purcell Nails, MD;  Location: [facility];  Service: Open Heart Surgery;  Laterality: N/A;   TRANSURETHRAL RESECTION OF PROSTATE N/A 11/27/2019   Procedure: TRANSURETHRAL RESECTION OF THE PROSTATE (TURP), BIPOLAR;  Surgeon: Rene Paci, MD;  Location: [facility];  Service: Urology;  Laterality: N/A;    Family History  Problem Relation Age of Onset   Heart disease Mother    Heart attack Mother    Alzheimer's disease Father    Heart disease Brother    Alzheimer's disease Brother     Social History Social History   Tobacco Use   Smoking status: Former    Years: 20.00    Types: Cigarettes    Quit date: 03/02/1994    Years since quitting: 27.1   Smokeless tobacco: Never  Vaping Use   Vaping Use: Never used  Substance Use Topics   Alcohol use: Yes    Comment: very seldom   Drug use: Never    No Known Allergies  Current Outpatient Medications  Medication Sig Dispense Refill   amLODipine (NORVASC) 10 MG tablet Take 1 tablet (10 mg total) by mouth daily. (Patient taking differently: Take 10 mg by mouth daily. ) 90 tablet 3   atorvastatin (LIPITOR) 80 MG tablet Take 1 tablet (80 mg total) by mouth daily at 6 PM. (Patient taking differently: Take 80 mg by mouth daily at 6 PM. ) 30 tablet 1   metFORMIN (GLUCOPHAGE) 500 MG tablet Take 500 mg by mouth daily with breakfast.      metoprolol tartrate (LOPRESSOR) 25 MG tablet Take 0.5 tablets (12.5 mg total) by mouth 2 (two) times daily. 90 tablet 3   mirabegron ER (MYRBETRIQ) 50 MG TB24 tablet Take  50 mg by mouth daily.     Omega-3 Fatty Acids (OMEGA 3 PO) Take 2 tablets by mouth daily.     PRODIGY TWIST TOP LANCETS 28G MISC   0   quinapril (ACCUPRIL) 40 MG tablet Take 40 mg by mouth daily.   0   tamsulosin (FLOMAX) 0.4 MG CAPS capsule Take 0.4 mg by mouth daily.      No current facility-administered medications for this visit.    REVIEW OF SYSTEMS:   Review of Systems  Constitutional: Negative for appetite change, chills, fatigue, fever and unexpected weight change.  HENT: Negative for mouth sores, nosebleeds, sore throat and trouble swallowing.   Eyes: Negative for eye problems and icterus.  Respiratory: Negative for cough, hemoptysis, shortness of breath and wheezing.   Cardiovascular: Negative for chest pain and leg swelling.  Gastrointestinal: Negative for abdominal pain, constipation, diarrhea,  nausea and vomiting.  Genitourinary: Negative for bladder incontinence, difficulty urinating, dysuria, frequency and hematuria.   Musculoskeletal: Negative for back pain, gait problem, neck pain and neck stiffness.  Skin: Negative for itching and rash.  Neurological: Negative for dizziness, extremity weakness, gait problem, headaches, light-headedness and seizures.  Hematological: Negative for adenopathy. Does not bruise/bleed easily.  Psychiatric/Behavioral: Negative for confusion, depression and sleep disturbance. The patient is not nervous/anxious.     PHYSICAL EXAMINATION:  Blood pressure (!) 155/89, pulse 64, temperature (!) 97.3 F (36.3 C), temperature source Tympanic, resp. rate 19, height 6' (1.829 m), weight 234 lb 11.2 oz (106.5 kg), SpO2 98 %.  ECOG PERFORMANCE STATUS: 1  Physical Exam  Constitutional: Oriented to person, place, and time and well-developed, well-nourished, and in no distress.  HENT:  Head: Normocephalic and atraumatic.  Mouth/Throat: Oropharynx is clear and moist. No oropharyngeal exudate.  Eyes: Conjunctivae are normal. Right eye exhibits no  discharge. Left eye exhibits no discharge. No scleral icterus.  Neck: Normal range of motion. Neck supple.  Cardiovascular: Normal rate, regular rhythm, normal heart sounds and intact distal pulses.   Pulmonary/Chest: Effort normal and breath sounds normal. No respiratory distress. No wheezes. No rales.  Abdominal: Soft. Bowel sounds are normal. Exhibits no distension and no mass. There is no tenderness.  Musculoskeletal: Normal range of motion. Exhibits no edema.  Lymphadenopathy:    No cervical adenopathy.  Neurological: Alert and oriented to person, place, and time. Exhibits normal muscle tone. Gait normal. Coordination normal.  Skin: Skin is warm and dry. No rash noted. Not diaphoretic. No erythema. No pallor.  Psychiatric: Mood, memory and judgment normal.  Vitals reviewed.  LABORATORY DATA: Lab Results  Component Value Date   WBC 8.1 04/22/2021   HGB 13.7 04/22/2021   HCT 39.8 04/22/2021   MCV 91.3 04/22/2021   PLT 179 04/22/2021      Chemistry      Component Value Date/Time   NA 139 04/22/2021 1328   K 4.5 04/22/2021 1328   CL 106 04/22/2021 1328   CO2 24 04/22/2021 1328   BUN 18 04/22/2021 1328   CREATININE 1.11 04/22/2021 1328      Component Value Date/Time   CALCIUM 9.9 04/22/2021 1328   ALKPHOS 102 04/22/2021 1328   AST 29 04/22/2021 1328   ALT 43 04/22/2021 1328   BILITOT 0.9 04/22/2021 1328       RADIOGRAPHIC STUDIES: US Abdomen Limited RUQ (LIVER/GB)  Result Date: 04/15/2021 CLINICAL DATA:  Elevated LFTs. EXAM: ULTRASOUND ABDOMEN LIMITED RIGHT UPPER QUADRANT COMPARISON:  CT abdomen and pelvis 09/11/2019 FINDINGS: Gallbladder: No gallstones or wall thickening visualized. No sonographic Murphy sign noted by sonographer. Common bile duct: Diameter: 3.9 mm. Liver: No focal lesion identified. The liver is diffusely heterogeneous and mildly echogenic. Portal vein is patent on color Doppler imaging with normal direction of blood flow towards the liver. Other:  None. IMPRESSION: 1. Heterogeneous liver with increased echotexture likely related to diffuse hepatocellular disease such as fatty infiltration. 2. Normal gallbladder. Electronically Signed   By: Darliss Cheney M.D.   On: 04/15/2021 21:52    ASSESSMENT: This is a very pleasant 80 year old Caucasian male referred to the clinic for evaluation to rule out hemochromatosis.   PLAN: Patient with several lab studies performed today including a CBC, CMP, iron studies, ferritin, LDH, vitamin B12, and hemochromatosis DNA.  His CBC was unremarkable.  His CMP was unremarkable except for blood sugar 133 for which the patient did not need to fast  prior to having his labs drawn today.  His ferritin is slightly elevated at 341.  Iron studies are within normal limits.  His LDH is within normal limits.  The patient was seen with Dr. Arbutus Ped today.  Dr. Arbutus Ped believes that it is unlikely that he has hemochromatosis; however, we will wait for the results of his hemochromatosis DNA testing which I expect the results in the next 10 to 14 days.  I will call the patient back and let them know the results.  We will bring him in for follow-up visit if positive.  If his test is negative, no further evaluation or work-up is needed.  The patient voices understanding of current disease status and treatment options and is in agreement with the current care plan.  All questions were answered. The patient knows to call the clinic with any problems, questions or concerns. We can certainly see the patient much sooner if necessary.  Thank you so much for allowing me to participate in the care of [name]. I will continue to follow up the patient with you and assist in his care.   Disclaimer: This note was dictated with voice recognition software. Similar sounding words can inadvertently be transcribed and may not be corrected upon review.   Edwen Mclester L Sharese Manrique April 22, 2021, 2:34  PM  ADDENDUM: Hematology/Oncology Attending: I had a face-to-face encounter with the patient today.  I reviewed his record, lab and recommended his care plan.  This is a very pleasant 80 years old white male with past medical history significant for hypertension, diabetes mellitus, dyslipidemia, NSTEMI as well as kidney stones.  The patient was seen by his primary care physician recently and routine blood work showed elevated liver enzymes.  He has ultrasound of the liver that showed increased echotexture suspicious for hepatocellular disease such as fatty infiltration.  During his evaluation he was also found to have significant elevation of ferritin level of 458.9.  The patient had normal iron of 116 and iron saturation of 48%.  He was referred to [place] today for evaluation and to rule out any underlying hemochromatosis. When seen today the patient is feeling fine today with no concerning complaints.  He is a Company secretary and very active. Will order several studies today for evaluation of his condition including repeat CBC that showed normal hemoglobin and hematocrit as well as white blood count and platelets.  Comprehensive metabolic panel is unremarkable except for blood glucose of 133.  He has low vitamin B12 level of 146.  His ferritin level is slightly elevated at 341.  His iron study is normal.  DNA for hemochromatosis is still pending. I had a lengthy discussion with the patient today about his condition.  I doubt the patient has hemochromatosis but we will wait for the DNA test before ruling out this possibility. I recommended for the patient to continue on observation with routine follow-up visit by his primary care physician from now on.  If he has homozygous hemochromatosis gene, will contact the patient for evaluation and consideration of phlebotomy to keep his ferritin level less than 50. For the vitamin B12 deficiency, will encourage the patient to take oral vitamin B12 supplements or he could be  considered for vitamin B12 injection at the cancer center or with his primary care physician. The patient was advised to call immediately if he has any other concerning symptoms in the interval.  The total time spent in the appointment was 60 minutes. Disclaimer: This note was dictated  with voice recognition software. Similar sounding words can inadvertently be transcribed and may be missed upon review. Lajuana Matte, MD 04/22/21

## 2021-04-22 ENCOUNTER — Inpatient Hospital Stay: Payer: Medicare Other

## 2021-04-22 ENCOUNTER — Other Ambulatory Visit: Payer: Self-pay

## 2021-04-22 ENCOUNTER — Inpatient Hospital Stay: Payer: Medicare Other | Attending: Physician Assistant | Admitting: Physician Assistant

## 2021-04-22 DIAGNOSIS — R7989 Other specified abnormal findings of blood chemistry: Secondary | ICD-10-CM | POA: Diagnosis not present

## 2021-04-22 DIAGNOSIS — Z7984 Long term (current) use of oral hypoglycemic drugs: Secondary | ICD-10-CM | POA: Diagnosis not present

## 2021-04-22 DIAGNOSIS — Z803 Family history of malignant neoplasm of breast: Secondary | ICD-10-CM

## 2021-04-22 DIAGNOSIS — E538 Deficiency of other specified B group vitamins: Secondary | ICD-10-CM | POA: Diagnosis not present

## 2021-04-22 DIAGNOSIS — I10 Essential (primary) hypertension: Secondary | ICD-10-CM | POA: Diagnosis not present

## 2021-04-22 DIAGNOSIS — Z951 Presence of aortocoronary bypass graft: Secondary | ICD-10-CM | POA: Insufficient documentation

## 2021-04-22 DIAGNOSIS — E119 Type 2 diabetes mellitus without complications: Secondary | ICD-10-CM

## 2021-04-22 DIAGNOSIS — Z87891 Personal history of nicotine dependence: Secondary | ICD-10-CM

## 2021-04-22 LAB — VITAMIN B12: Vitamin B-12: 146 pg/mL — ABNORMAL LOW (ref 180–914)

## 2021-04-22 LAB — CBC WITH DIFFERENTIAL (CANCER CENTER ONLY)
Abs Immature Granulocytes: 0.01 10*3/uL (ref 0.00–0.07)
Basophils Absolute: 0.1 10*3/uL (ref 0.0–0.1)
Basophils Relative: 1 %
Eosinophils Absolute: 0.4 10*3/uL (ref 0.0–0.5)
Eosinophils Relative: 4 %
HCT: 39.8 % (ref 39.0–52.0)
Hemoglobin: 13.7 g/dL (ref 13.0–17.0)
Immature Granulocytes: 0 %
Lymphocytes Relative: 41 %
Lymphs Abs: 3.3 10*3/uL (ref 0.7–4.0)
MCH: 31.4 pg (ref 26.0–34.0)
MCHC: 34.4 g/dL (ref 30.0–36.0)
MCV: 91.3 fL (ref 80.0–100.0)
Monocytes Absolute: 0.5 10*3/uL (ref 0.1–1.0)
Monocytes Relative: 7 %
Neutro Abs: 3.9 10*3/uL (ref 1.7–7.7)
Neutrophils Relative %: 47 %
Platelet Count: 179 10*3/uL (ref 150–400)
RBC: 4.36 MIL/uL (ref 4.22–5.81)
RDW: 12.5 % (ref 11.5–15.5)
WBC Count: 8.1 10*3/uL (ref 4.0–10.5)
nRBC: 0 % (ref 0.0–0.2)

## 2021-04-22 LAB — CMP (CANCER CENTER ONLY)
ALT: 43 U/L (ref 0–44)
AST: 29 U/L (ref 15–41)
Albumin: 3.8 g/dL (ref 3.5–5.0)
Alkaline Phosphatase: 102 U/L (ref 38–126)
Anion gap: 9 (ref 5–15)
BUN: 18 mg/dL (ref 8–23)
CO2: 24 mmol/L (ref 22–32)
Calcium: 9.9 mg/dL (ref 8.9–10.3)
Chloride: 106 mmol/L (ref 98–111)
Creatinine: 1.11 mg/dL (ref 0.61–1.24)
GFR, Estimated: >60 mL/min (ref >60)
Glucose, Bld: 133 mg/dL — ABNORMAL HIGH (ref 70–99)
Potassium: 4.5 mmol/L (ref 3.5–5.1)
Sodium: 139 mmol/L (ref 135–145)
Total Bilirubin: 0.9 mg/dL (ref 0.3–1.2)
Total Protein: 7.4 g/dL (ref 6.5–8.1)

## 2021-04-22 LAB — LACTATE DEHYDROGENASE: LDH: 165 U/L (ref 98–192)

## 2021-04-22 LAB — IRON AND TIBC
Iron: 119 ug/dL (ref 42–163)
Saturation Ratios: 45 % (ref 20–55)
TIBC: 263 ug/dL (ref 202–409)
UIBC: 144 ug/dL (ref 117–376)

## 2021-04-22 LAB — FERRITIN: Ferritin: 341 ng/mL — ABNORMAL HIGH (ref 24–336)

## 2021-04-23 ENCOUNTER — Telehealth: Payer: Self-pay | Admitting: Physician Assistant

## 2021-04-23 NOTE — Telephone Encounter (Signed)
I called the patient because his vitamin B12 is low.  He was given the option of taking over-the-counter B12 supplements, receiving B12 injections through his primary care provider's office, or receiving B12 injections through our office.  The patient opted to take over-the-counter B12 supplements.

## 2021-04-27 LAB — HEMOCHROMATOSIS DNA-PCR(C282Y,H63D)

## 2021-04-29 ENCOUNTER — Telehealth: Payer: Self-pay | Admitting: Physician Assistant

## 2021-04-29 NOTE — Telephone Encounter (Signed)
I called the patient and let him know the results of his hemochromatosis DNA test.  The patient is heterozygous.  I reviewed the results with Dr. Arbutus Ped and it is unlikely that the patient is at increased risk for iron storage/deposition.  The patient's ferritin was slightly elevated at 341 which could be secondary to inflammation as ferritin is an acute phase reactant.  Dr. Arbutus Ped does not believe any intervention is required and the patient did not have evidence of iron overload on iron studies.  No further work-up is needed but we be happy to see the patient on an as-needed basis.  I called the patient and relayed this information to him

## 2021-07-07 ENCOUNTER — Other Ambulatory Visit (HOSPITAL_COMMUNITY): Payer: Self-pay | Admitting: Internal Medicine

## 2021-07-07 DIAGNOSIS — H4921 Sixth [abducent] nerve palsy, right eye: Secondary | ICD-10-CM

## 2021-07-08 ENCOUNTER — Ambulatory Visit (HOSPITAL_COMMUNITY)
Admission: RE | Admit: 2021-07-08 | Discharge: 2021-07-08 | Disposition: A | Discharge status: Home / Self Care | Payer: Medicare Other | Source: Ambulatory Visit | Attending: Internal Medicine | Admitting: Internal Medicine

## 2021-07-08 ENCOUNTER — Other Ambulatory Visit: Payer: Self-pay

## 2021-07-08 DIAGNOSIS — I6782 Cerebral ischemia: Secondary | ICD-10-CM | POA: Insufficient documentation

## 2021-07-08 DIAGNOSIS — H4921 Sixth [abducent] nerve palsy, right eye: Secondary | ICD-10-CM | POA: Diagnosis present

## 2021-07-08 MED ORDER — GADOBUTROL 1 MMOL/ML IV SOLN
10.0000 mL | Freq: Once | INTRAVENOUS | Status: AC | PRN
  Administered 2021-07-08: 22:00:00 10 mL via INTRAVENOUS

## 2021-07-09 ENCOUNTER — Other Ambulatory Visit (HOSPITAL_COMMUNITY): Payer: Self-pay | Admitting: Internal Medicine

## 2021-07-09 ENCOUNTER — Ambulatory Visit (HOSPITAL_COMMUNITY)
Admission: RE | Admit: 2021-07-09 | Discharge: 2021-07-09 | Disposition: A | Discharge status: Home / Self Care | Payer: Medicare Other | Source: Ambulatory Visit | Attending: Internal Medicine | Admitting: Internal Medicine

## 2021-07-09 ENCOUNTER — Other Ambulatory Visit: Payer: Self-pay

## 2021-07-09 DIAGNOSIS — I6529 Occlusion and stenosis of unspecified carotid artery: Secondary | ICD-10-CM

## 2021-07-13 ENCOUNTER — Ambulatory Visit (HOSPITAL_COMMUNITY)
Admission: RE | Admit: 2021-07-13 | Discharge: 2021-07-13 | Disposition: A | Discharge status: Home / Self Care | Payer: Medicare Other | Source: Ambulatory Visit | Attending: Internal Medicine | Admitting: Internal Medicine

## 2021-07-13 ENCOUNTER — Other Ambulatory Visit: Payer: Self-pay

## 2021-07-13 DIAGNOSIS — I6529 Occlusion and stenosis of unspecified carotid artery: Secondary | ICD-10-CM | POA: Insufficient documentation

## 2021-07-14 ENCOUNTER — Encounter: Payer: Self-pay | Admitting: Neurology

## 2021-07-14 ENCOUNTER — Ambulatory Visit: Payer: Medicare Other | Admitting: Neurology

## 2021-07-14 VITALS — BP 122/84 | HR 85 | Ht 72.0 in | Wt 235.6 lb

## 2021-07-14 DIAGNOSIS — H4921 Sixth [abducent] nerve palsy, right eye: Secondary | ICD-10-CM

## 2021-07-14 MED ORDER — CLOPIDOGREL BISULFATE 75 MG PO TABS
75.0000 mg | ORAL_TABLET | Freq: Every day | ORAL | 11 refills | Status: DC
Start: 2021-07-14 — End: 2022-06-24

## 2021-07-14 NOTE — Progress Notes (Signed)
Chief Complaint  Patient presents with   New Patient (Initial Visit)    Rm 15. Alone. NP proficient paper referral for double vision/Dr. Bazine. Pt c/o double vision for one week.      ASSESSMENT AND PLAN  [name] is a 80 y.o. 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  Laboratory evaluation in 2022, normal CMP, creatinine of mild elevated triglyceride 150, LDL 68, normal TSH, PSA, A1c 5.9 CBC hemoglobin of 15   MEDICAL HISTORY:  [name] is a 80 year old male, seen in request by his ophthalmologist for evaluation of  Dr. Sueanne Margarita, DO for evaluation of double vision, initial evaluation was on July 14, 2021.  I reviewed and summarized the referring note. PMHX. HTN DM CAD.  He woke up on July 06, 2021 noticed double vision, especially looking to the right side, only slight improvement over the past 1 week since symptom onset, was seen by Dr.Skakle,  He has been active, bicycle 10 to 12 miles each day for 1 hour,   I personally reviewed MRI brain on Jul 08 2021:  1. No acute intracranial abnormality. No structural findings to explain patient's symptoms identified. 2. Moderate chronic microvascular ischemic disease, with a few scattered remote lacunar infarcts about the bilateral thalami.   MRA HEAD showed no large vessel disease, Ultrasound of carotid arteries showed no large vessel disease  He has been taking ASA 81mg  daily  PHYSICAL EXAM:   Vitals:   07/14/21 1456  BP: 122/84   Pulse: 85  Weight: 235 lb 9.6 oz (106.9 kg)  Height: 6' (1.829 m)   Not recorded     Body mass index is 31.95 kg/m.  PHYSICAL EXAMNIATION:  Gen: NAD, conversant, well nourised, well groomed                     Cardiovascular: Regular rate rhythm, no peripheral edema, warm, nontender. Eyes: Conjunctivae clear without exudates or hemorrhage Neck: Supple, no carotid bruits. Pulmonary: Clear to auscultation bilaterally   NEUROLOGICAL EXAM:  MENTAL STATUS: Speech:    Speech is normal; fluent and spontaneous with normal comprehension.  Cognition:     Orientation to time, place and person     Normal recent and remote memory     Normal Attention span and concentration     Normal Language, naming, repeating,spontaneous speech     Fund of knowledge   CRANIAL NERVES: CN II: Visual fields are full to confrontation. Pupils are round equal and briskly reactive to light. CN III, IV, VI: Right abduction weakness, CN V: Facial sensation is intact to light touch CN VII: Face is symmetric with normal eye closure  CN VIII: Hearing is normal to causal conversation. CN IX, X: Phonation is normal. CN XI: Head turning and shoulder shrug are intact  MOTOR: There is no pronator drift of out-stretched arms. Muscle bulk and tone are normal. Muscle strength is normal.  REFLEXES: Reflexes are 1 and symmetric at the biceps, triceps, knees, and ankles. Plantar responses are flexor.  SENSORY: Intact to light touch, pinprick  and vibratory sensation are intact in fingers and toes.  COORDINATION: There is no trunk or limb dysmetria noted.  GAIT/STANCE: Gait is mildly unsteady due to double vision    REVIEW OF SYSTEMS:  Full 14 system review of systems performed and notable only for as above All other review of systems were negative.   ALLERGIES: No Known Allergies  HOME MEDICATIONS: Current Outpatient Medications  Medication Sig Dispense Refill   amLODipine (NORVASC) 10 MG tablet  Take 1 tablet (10 mg total) by mouth daily. (Patient taking differently: Take 10 mg by mouth daily.) 90 tablet 3   glipiZIDE (GLUCOTROL) 5 MG tablet Take 5 mg by mouth daily.     metFORMIN (GLUCOPHAGE) 500 MG tablet Take 500 mg by mouth daily with breakfast.      metoprolol tartrate (LOPRESSOR) 25 MG tablet Take 0.5 tablets (12.5 mg total) by mouth 2 (two) times daily. 90 tablet 3   mirabegron ER (MYRBETRIQ) 50 MG TB24 tablet Take 50 mg by mouth daily.     Omega-3 Fatty Acids (OMEGA 3 PO) Take 2 tablets by mouth daily.     PRODIGY TWIST TOP LANCETS 28G MISC   0   quinapril (ACCUPRIL) 40 MG tablet Take 40 mg by mouth daily.   0   No current facility-administered medications for this visit.    PAST MEDICAL HISTORY: Past Medical History:  Diagnosis Date   Benign localized prostatic hyperplasia with lower urinary tract symptoms (LUTS)    Bladder stones    Coronary artery disease cardiologist--- dr Kirtland Bouchard. 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   LIMA to LAD, SVG to Diagonal Branch, sequential SVG to OM1-OM2, SVG to PDA, EVH from bilateral thighs and right lower leg   Type 2 diabetes mellitus (HCC)    folllowed by pcp   (11-20-2019  pt stated he check blood sugar daily in am,  fasting sugar--- 110 - 1025    PAST SURGICAL HISTORY: Past Surgical History:  Procedure Laterality Date   CARDIAC CATHETERIZATION N/A 12/01/2014   Procedure: Left Heart Cath and Coronary Angiography;  Surgeon: Tonny Bollman, MD;  Location: [facility] INVASIVE CV LAB;  Service: Cardiovascular;  Laterality: N/A;   CATARACT EXTRACTION W/ INTRAOCULAR LENS  IMPLANT, BILATERAL  07-30-2019 and  08-24-2019   CORONARY ARTERY BYPASS GRAFT N/A 12/05/2014   Procedure: CORONARY ARTERY BYPASS GRAFTING (CABG), ON PUMP, TIMES FIVE, USING LEFT INTERNAL MAMMARY ARTERY, BILATERAL GREATER SAPHENOUS VEIN HARVESTED ENDOSCOPICALLY;  Surgeon: Purcell Nails, MD;  Location: [facility] OR;  Service: Open Heart Surgery;  Laterality: N/A;  LIMA-LAD; SEQ SVG-OM1-OM2; SVG-DIAG; SVG-PD   CYSTOSCOPY WITH LITHOLAPAXY N/A 11/27/2019   Procedure: CYSTOSCOPY WITH LITHOLAPAXY;  Surgeon: Rene Paci, MD;  Location: [facility];  Service: Urology;  Laterality: N/A;   ROTATOR CUFF REPAIR Left 05/1994   TEE WITHOUT CARDIOVERSION N/A 12/05/2014   Procedure: TRANSESOPHAGEAL ECHOCARDIOGRAM (TEE);  Surgeon: Purcell Nails, MD;  Location: [facility] OR;  Service: Open Heart Surgery;  Laterality: N/A;   TRANSURETHRAL RESECTION OF PROSTATE N/A 11/27/2019   Procedure: TRANSURETHRAL RESECTION OF THE PROSTATE (  TURP), BIPOLAR;  Surgeon: Ceasar Mons, MD;  Location: [facility];  Service: Urology;  Laterality: N/A;    FAMILY HISTORY: Family History  Problem Relation Age of Onset   Heart disease Mother    Heart attack Mother    Alzheimer's disease Father    Heart disease Brother    Alzheimer's disease Brother     SOCIAL HISTORY: Social History   Socioeconomic History   Marital status: Married    Spouse name: Not on file   Number of children: Not on file   Years of education: Not on file   Highest education level: Not on file  Occupational History   Not on file  Tobacco Use   Smoking status: Former    Years: 20.00    Types: Cigarettes    Quit date: 03/02/1994    Years since quitting: 27.3   Smokeless tobacco: Never  Vaping Use   Vaping Use: Never used  Substance and Sexual Activity   Alcohol use: Yes    Comment: very seldom   Drug use: Never   Sexual activity: Not on file  Other Topics Concern   Not on file  Social History Narrative   Not on file   Social  Determinants of Health   Financial Resource Strain: Not on file  Food Insecurity: Not on file  Transportation Needs: Not on file  Physical Activity: Not on file  Stress: Not on file  Social Connections: Not on file  Intimate Partner Violence: Not on file      Marcial Pacas, M.D. Ph.D.  [facility] [address] [place], [place] [address] Ph: [phone] Fax: [phone]  CC:  Sueanne Margarita, [place],  [place] [address]  Sueanne Margarita, DO

## 2021-08-06 ENCOUNTER — Ambulatory Visit (HOSPITAL_COMMUNITY): Payer: Medicare Other | Attending: Cardiovascular Disease

## 2021-08-06 ENCOUNTER — Other Ambulatory Visit: Payer: Self-pay

## 2021-08-06 DIAGNOSIS — I639 Cerebral infarction, unspecified: Secondary | ICD-10-CM | POA: Diagnosis not present

## 2021-08-06 DIAGNOSIS — E785 Hyperlipidemia, unspecified: Secondary | ICD-10-CM | POA: Insufficient documentation

## 2021-08-06 DIAGNOSIS — E119 Type 2 diabetes mellitus without complications: Secondary | ICD-10-CM | POA: Diagnosis not present

## 2021-08-06 DIAGNOSIS — I340 Nonrheumatic mitral (valve) insufficiency: Secondary | ICD-10-CM

## 2021-08-06 DIAGNOSIS — I10 Essential (primary) hypertension: Secondary | ICD-10-CM | POA: Diagnosis not present

## 2021-08-06 DIAGNOSIS — I2510 Atherosclerotic heart disease of native coronary artery without angina pectoris: Secondary | ICD-10-CM | POA: Insufficient documentation

## 2021-08-06 DIAGNOSIS — Z951 Presence of aortocoronary bypass graft: Secondary | ICD-10-CM | POA: Diagnosis not present

## 2021-08-06 DIAGNOSIS — I4891 Unspecified atrial fibrillation: Secondary | ICD-10-CM | POA: Insufficient documentation

## 2021-08-06 DIAGNOSIS — I252 Old myocardial infarction: Secondary | ICD-10-CM | POA: Diagnosis not present

## 2021-08-06 DIAGNOSIS — H4921 Sixth [abducent] nerve palsy, right eye: Secondary | ICD-10-CM | POA: Diagnosis not present

## 2021-08-06 LAB — ECHOCARDIOGRAM COMPLETE
Area-P 1/2: 3.17 cm2
S' Lateral: 3 cm

## 2021-11-17 ENCOUNTER — Ambulatory Visit: Payer: Medicare Other | Admitting: Adult Health

## 2022-06-23 ENCOUNTER — Other Ambulatory Visit: Payer: Self-pay | Admitting: Neurology

## 2022-06-24 ENCOUNTER — Other Ambulatory Visit: Payer: Self-pay | Admitting: Neurology

## 2022-08-02 ENCOUNTER — Other Ambulatory Visit: Payer: Self-pay | Admitting: Neurology

## 2022-10-22 ENCOUNTER — Other Ambulatory Visit: Payer: Self-pay | Admitting: Neurology

## 2023-01-27 ENCOUNTER — Other Ambulatory Visit: Payer: Self-pay | Admitting: Neurology

## 2024-07-25 ENCOUNTER — Other Ambulatory Visit: Payer: Self-pay | Admitting: Internal Medicine

## 2024-07-25 DIAGNOSIS — R7989 Other specified abnormal findings of blood chemistry: Secondary | ICD-10-CM

## 2024-08-07 ENCOUNTER — Ambulatory Visit
Admission: RE | Admit: 2024-08-07 | Discharge: 2024-08-07 | Disposition: A | Source: Ambulatory Visit | Attending: Internal Medicine

## 2024-08-07 DIAGNOSIS — R7989 Other specified abnormal findings of blood chemistry: Secondary | ICD-10-CM
